# Patient Record
Sex: Female | Born: 1935 | Race: Black or African American | Hispanic: No | Marital: Married
Health system: Southern US, Community
[De-identification: ages and names within clinical notes are randomized; demographics above are authoritative.]

## PROBLEM LIST (undated history)

## (undated) DIAGNOSIS — I509 Heart failure, unspecified: Secondary | ICD-10-CM

## (undated) DIAGNOSIS — R569 Unspecified convulsions: Secondary | ICD-10-CM

## (undated) DIAGNOSIS — R55 Syncope and collapse: Secondary | ICD-10-CM

## (undated) DIAGNOSIS — E119 Type 2 diabetes mellitus without complications: Secondary | ICD-10-CM

## (undated) DIAGNOSIS — I10 Essential (primary) hypertension: Secondary | ICD-10-CM

## (undated) DIAGNOSIS — E079 Disorder of thyroid, unspecified: Secondary | ICD-10-CM

## (undated) HISTORY — DX: Essential (primary) hypertension: I10

## (undated) HISTORY — DX: Disorder of thyroid, unspecified: E07.9

## (undated) HISTORY — DX: Type 2 diabetes mellitus without complications: E11.9

## (undated) HISTORY — PX: OTHER SURGICAL HISTORY: SHX169

## (undated) HISTORY — DX: Unspecified convulsions: R56.9

## (undated) HISTORY — PX: FRACTURE SURGERY: SHX138

## (undated) HISTORY — DX: Syncope and collapse: R55

## (undated) HISTORY — DX: Heart failure, unspecified: I50.9

---

## 1998-04-26 ENCOUNTER — Encounter: Payer: Self-pay | Admitting: Thoracic Surgery

## 1998-04-27 ENCOUNTER — Ambulatory Visit (HOSPITAL_COMMUNITY): Admission: RE | Admit: 1998-04-27 | Discharge: 1998-04-27 | Payer: Self-pay | Admitting: Thoracic Surgery

## 1998-05-02 ENCOUNTER — Encounter: Payer: Self-pay | Admitting: Thoracic Surgery

## 1998-05-03 ENCOUNTER — Encounter: Payer: Self-pay | Admitting: Thoracic Surgery

## 1998-05-03 ENCOUNTER — Inpatient Hospital Stay (HOSPITAL_COMMUNITY): Admission: RE | Admit: 1998-05-03 | Discharge: 1998-05-09 | Payer: Self-pay | Admitting: Thoracic Surgery

## 1998-05-04 ENCOUNTER — Encounter: Payer: Self-pay | Admitting: Thoracic Surgery

## 1998-05-05 ENCOUNTER — Encounter: Payer: Self-pay | Admitting: Thoracic Surgery

## 1998-05-06 ENCOUNTER — Encounter: Payer: Self-pay | Admitting: Thoracic Surgery

## 1998-05-07 ENCOUNTER — Encounter: Payer: Self-pay | Admitting: Thoracic Surgery

## 2000-04-10 ENCOUNTER — Encounter: Admission: RE | Admit: 2000-04-10 | Discharge: 2000-04-10 | Payer: Self-pay | Admitting: Thoracic Surgery

## 2000-04-10 ENCOUNTER — Encounter: Payer: Self-pay | Admitting: Thoracic Surgery

## 2000-06-19 ENCOUNTER — Encounter: Payer: Self-pay | Admitting: Family Medicine

## 2000-06-19 ENCOUNTER — Ambulatory Visit (HOSPITAL_COMMUNITY): Admission: RE | Admit: 2000-06-19 | Discharge: 2000-06-19 | Payer: Self-pay | Admitting: Family Medicine

## 2000-07-08 ENCOUNTER — Ambulatory Visit (HOSPITAL_COMMUNITY): Admission: RE | Admit: 2000-07-08 | Discharge: 2000-07-08 | Payer: Self-pay | Admitting: Cardiology

## 2000-08-07 ENCOUNTER — Encounter: Admission: RE | Admit: 2000-08-07 | Discharge: 2000-08-07 | Payer: Self-pay | Admitting: Thoracic Surgery

## 2000-08-07 ENCOUNTER — Encounter: Payer: Self-pay | Admitting: Thoracic Surgery

## 2000-09-14 ENCOUNTER — Encounter: Payer: Self-pay | Admitting: Emergency Medicine

## 2000-09-14 ENCOUNTER — Emergency Department (HOSPITAL_COMMUNITY): Admission: EM | Admit: 2000-09-14 | Discharge: 2000-09-14 | Payer: Self-pay | Admitting: Emergency Medicine

## 2000-10-22 ENCOUNTER — Ambulatory Visit (HOSPITAL_COMMUNITY): Admission: RE | Admit: 2000-10-22 | Discharge: 2000-10-22 | Payer: Self-pay | Admitting: Ophthalmology

## 2000-11-18 ENCOUNTER — Encounter: Payer: Self-pay | Admitting: Family Medicine

## 2000-11-18 ENCOUNTER — Ambulatory Visit (HOSPITAL_COMMUNITY): Admission: RE | Admit: 2000-11-18 | Discharge: 2000-11-18 | Payer: Self-pay | Admitting: Family Medicine

## 2000-12-10 ENCOUNTER — Encounter: Admission: RE | Admit: 2000-12-10 | Discharge: 2000-12-10 | Payer: Self-pay | Admitting: Thoracic Surgery

## 2000-12-10 ENCOUNTER — Encounter: Payer: Self-pay | Admitting: Thoracic Surgery

## 2001-06-10 ENCOUNTER — Encounter: Payer: Self-pay | Admitting: Thoracic Surgery

## 2001-06-10 ENCOUNTER — Encounter: Admission: RE | Admit: 2001-06-10 | Discharge: 2001-06-10 | Payer: Self-pay | Admitting: Thoracic Surgery

## 2001-06-25 ENCOUNTER — Encounter: Payer: Self-pay | Admitting: Thoracic Surgery

## 2001-06-25 ENCOUNTER — Encounter: Admission: RE | Admit: 2001-06-25 | Discharge: 2001-06-25 | Payer: Self-pay | Admitting: Thoracic Surgery

## 2001-11-19 ENCOUNTER — Ambulatory Visit (HOSPITAL_COMMUNITY): Admission: RE | Admit: 2001-11-19 | Discharge: 2001-11-19 | Payer: Self-pay | Admitting: Family Medicine

## 2001-11-19 ENCOUNTER — Encounter: Payer: Self-pay | Admitting: Family Medicine

## 2001-12-26 ENCOUNTER — Encounter: Payer: Self-pay | Admitting: Family Medicine

## 2001-12-26 ENCOUNTER — Encounter: Admission: RE | Admit: 2001-12-26 | Discharge: 2001-12-26 | Payer: Self-pay | Admitting: Family Medicine

## 2001-12-26 ENCOUNTER — Encounter: Payer: Self-pay | Admitting: Thoracic Surgery

## 2002-02-09 ENCOUNTER — Ambulatory Visit (HOSPITAL_COMMUNITY): Admission: RE | Admit: 2002-02-09 | Discharge: 2002-02-09 | Payer: Self-pay | Admitting: Cardiology

## 2002-05-28 ENCOUNTER — Encounter: Payer: Self-pay | Admitting: Family Medicine

## 2002-05-28 ENCOUNTER — Ambulatory Visit (HOSPITAL_COMMUNITY): Admission: RE | Admit: 2002-05-28 | Discharge: 2002-05-28 | Payer: Self-pay | Admitting: Family Medicine

## 2002-06-01 ENCOUNTER — Ambulatory Visit (HOSPITAL_COMMUNITY): Admission: RE | Admit: 2002-06-01 | Discharge: 2002-06-01 | Payer: Self-pay | Admitting: Neurology

## 2002-06-02 ENCOUNTER — Inpatient Hospital Stay (HOSPITAL_COMMUNITY): Admission: RE | Admit: 2002-06-02 | Discharge: 2002-06-07 | Payer: Self-pay | Admitting: Family Medicine

## 2002-06-02 ENCOUNTER — Encounter: Payer: Self-pay | Admitting: Family Medicine

## 2002-06-05 ENCOUNTER — Ambulatory Visit (HOSPITAL_COMMUNITY): Admission: RE | Admit: 2002-06-05 | Discharge: 2002-06-05 | Payer: Self-pay | Admitting: Family Medicine

## 2002-06-05 ENCOUNTER — Encounter: Payer: Self-pay | Admitting: Family Medicine

## 2002-06-30 ENCOUNTER — Encounter: Payer: Self-pay | Admitting: Thoracic Surgery

## 2002-06-30 ENCOUNTER — Encounter: Admission: RE | Admit: 2002-06-30 | Discharge: 2002-06-30 | Payer: Self-pay | Admitting: Thoracic Surgery

## 2002-07-15 ENCOUNTER — Inpatient Hospital Stay (HOSPITAL_COMMUNITY): Admission: EM | Admit: 2002-07-15 | Discharge: 2002-07-24 | Payer: Self-pay | Admitting: *Deleted

## 2002-07-15 ENCOUNTER — Encounter: Payer: Self-pay | Admitting: Internal Medicine

## 2002-07-15 ENCOUNTER — Encounter: Payer: Self-pay | Admitting: Family Medicine

## 2002-07-17 ENCOUNTER — Encounter: Payer: Self-pay | Admitting: Family Medicine

## 2002-07-17 ENCOUNTER — Encounter: Payer: Self-pay | Admitting: General Surgery

## 2002-07-20 ENCOUNTER — Encounter: Payer: Self-pay | Admitting: Family Medicine

## 2002-07-23 ENCOUNTER — Encounter (INDEPENDENT_AMBULATORY_CARE_PROVIDER_SITE_OTHER): Payer: Self-pay | Admitting: Internal Medicine

## 2002-09-08 ENCOUNTER — Ambulatory Visit (HOSPITAL_COMMUNITY): Admission: RE | Admit: 2002-09-08 | Discharge: 2002-09-08 | Payer: Self-pay | Admitting: Cardiology

## 2002-09-22 ENCOUNTER — Ambulatory Visit (HOSPITAL_COMMUNITY): Admission: RE | Admit: 2002-09-22 | Discharge: 2002-09-22 | Payer: Self-pay | Admitting: Family Medicine

## 2002-09-22 ENCOUNTER — Encounter: Payer: Self-pay | Admitting: Family Medicine

## 2002-09-23 ENCOUNTER — Encounter: Payer: Self-pay | Admitting: Family Medicine

## 2002-09-23 ENCOUNTER — Ambulatory Visit (HOSPITAL_COMMUNITY): Admission: RE | Admit: 2002-09-23 | Discharge: 2002-09-23 | Payer: Self-pay | Admitting: Family Medicine

## 2002-09-30 ENCOUNTER — Ambulatory Visit (HOSPITAL_COMMUNITY): Admission: RE | Admit: 2002-09-30 | Discharge: 2002-09-30 | Payer: Self-pay | Admitting: Family Medicine

## 2002-09-30 ENCOUNTER — Encounter: Payer: Self-pay | Admitting: Family Medicine

## 2002-10-06 ENCOUNTER — Encounter: Payer: Self-pay | Admitting: Interventional Radiology

## 2002-10-06 ENCOUNTER — Inpatient Hospital Stay (HOSPITAL_COMMUNITY): Admission: RE | Admit: 2002-10-06 | Discharge: 2002-10-07 | Payer: Self-pay | Admitting: Interventional Radiology

## 2002-10-06 ENCOUNTER — Encounter (INDEPENDENT_AMBULATORY_CARE_PROVIDER_SITE_OTHER): Payer: Self-pay | Admitting: Specialist

## 2002-10-25 ENCOUNTER — Inpatient Hospital Stay (HOSPITAL_COMMUNITY): Admission: EM | Admit: 2002-10-25 | Discharge: 2002-11-02 | Payer: Self-pay | Admitting: Emergency Medicine

## 2002-10-25 ENCOUNTER — Encounter: Payer: Self-pay | Admitting: Emergency Medicine

## 2002-10-27 ENCOUNTER — Encounter: Payer: Self-pay | Admitting: Orthopaedic Surgery

## 2002-11-02 ENCOUNTER — Inpatient Hospital Stay (HOSPITAL_COMMUNITY)
Admission: RE | Admit: 2002-11-02 | Discharge: 2002-11-19 | Payer: Self-pay | Admitting: Physical Medicine & Rehabilitation

## 2002-11-13 ENCOUNTER — Encounter: Payer: Self-pay | Admitting: Physical Medicine & Rehabilitation

## 2002-11-14 ENCOUNTER — Encounter: Payer: Self-pay | Admitting: Physical Medicine & Rehabilitation

## 2002-11-17 ENCOUNTER — Encounter: Payer: Self-pay | Admitting: Physical Medicine & Rehabilitation

## 2002-11-27 ENCOUNTER — Encounter: Payer: Self-pay | Admitting: Orthopaedic Surgery

## 2002-11-27 ENCOUNTER — Ambulatory Visit (HOSPITAL_COMMUNITY): Admission: RE | Admit: 2002-11-27 | Discharge: 2002-11-27 | Payer: Self-pay | Admitting: Orthopaedic Surgery

## 2002-12-25 ENCOUNTER — Ambulatory Visit (HOSPITAL_COMMUNITY): Admission: RE | Admit: 2002-12-25 | Discharge: 2002-12-25 | Payer: Self-pay | Admitting: Orthopaedic Surgery

## 2002-12-25 ENCOUNTER — Encounter: Payer: Self-pay | Admitting: Orthopaedic Surgery

## 2003-05-26 ENCOUNTER — Encounter: Admission: RE | Admit: 2003-05-26 | Discharge: 2003-05-26 | Payer: Self-pay | Admitting: Thoracic Surgery

## 2003-08-05 ENCOUNTER — Ambulatory Visit (HOSPITAL_COMMUNITY): Admission: RE | Admit: 2003-08-05 | Discharge: 2003-08-05 | Payer: Self-pay | Admitting: Cardiology

## 2003-12-02 ENCOUNTER — Encounter: Admission: RE | Admit: 2003-12-02 | Discharge: 2003-12-02 | Payer: Self-pay | Admitting: Thoracic Surgery

## 2004-03-13 ENCOUNTER — Ambulatory Visit: Payer: Self-pay | Admitting: Cardiology

## 2004-07-30 ENCOUNTER — Inpatient Hospital Stay (HOSPITAL_COMMUNITY): Admission: EM | Admit: 2004-07-30 | Discharge: 2004-08-22 | Payer: Self-pay | Admitting: Emergency Medicine

## 2004-08-01 ENCOUNTER — Ambulatory Visit: Payer: Self-pay | Admitting: *Deleted

## 2004-08-02 ENCOUNTER — Encounter: Payer: Self-pay | Admitting: Cardiology

## 2004-08-02 ENCOUNTER — Ambulatory Visit: Payer: Self-pay | Admitting: Cardiology

## 2004-08-03 ENCOUNTER — Ambulatory Visit: Payer: Self-pay | Admitting: Cardiology

## 2004-08-03 ENCOUNTER — Encounter: Payer: Self-pay | Admitting: Cardiology

## 2004-08-07 ENCOUNTER — Ambulatory Visit: Payer: Self-pay | Admitting: Pulmonary Disease

## 2004-08-10 ENCOUNTER — Ambulatory Visit: Payer: Self-pay | Admitting: Gastroenterology

## 2004-08-11 ENCOUNTER — Encounter: Payer: Self-pay | Admitting: Cardiology

## 2004-08-21 ENCOUNTER — Ambulatory Visit: Payer: Self-pay | Admitting: Cardiology

## 2004-08-21 ENCOUNTER — Encounter: Payer: Self-pay | Admitting: Cardiology

## 2004-09-12 ENCOUNTER — Ambulatory Visit: Payer: Self-pay | Admitting: *Deleted

## 2004-09-13 ENCOUNTER — Ambulatory Visit (HOSPITAL_COMMUNITY): Admission: RE | Admit: 2004-09-13 | Discharge: 2004-09-13 | Payer: Self-pay | Admitting: Cardiology

## 2004-09-18 ENCOUNTER — Ambulatory Visit (HOSPITAL_COMMUNITY): Admission: RE | Admit: 2004-09-18 | Discharge: 2004-09-18 | Payer: Self-pay | Admitting: Cardiology

## 2004-09-27 ENCOUNTER — Encounter: Admission: RE | Admit: 2004-09-27 | Discharge: 2004-09-27 | Payer: Self-pay | Admitting: Thoracic Surgery

## 2004-12-06 ENCOUNTER — Encounter: Admission: RE | Admit: 2004-12-06 | Discharge: 2004-12-06 | Payer: Self-pay | Admitting: Thoracic Surgery

## 2004-12-11 ENCOUNTER — Ambulatory Visit: Payer: Self-pay | Admitting: Cardiology

## 2005-03-23 ENCOUNTER — Ambulatory Visit: Payer: Self-pay | Admitting: *Deleted

## 2005-03-23 ENCOUNTER — Ambulatory Visit: Payer: Self-pay | Admitting: Orthopedic Surgery

## 2005-03-23 ENCOUNTER — Inpatient Hospital Stay (HOSPITAL_COMMUNITY): Admission: EM | Admit: 2005-03-23 | Discharge: 2005-04-09 | Payer: Self-pay | Admitting: Emergency Medicine

## 2005-03-26 ENCOUNTER — Ambulatory Visit: Payer: Self-pay | Admitting: *Deleted

## 2005-04-09 ENCOUNTER — Inpatient Hospital Stay: Admission: AD | Admit: 2005-04-09 | Discharge: 2005-04-13 | Payer: Self-pay | Admitting: Internal Medicine

## 2005-04-11 ENCOUNTER — Emergency Department (HOSPITAL_COMMUNITY): Admission: EM | Admit: 2005-04-11 | Discharge: 2005-04-11 | Payer: Self-pay | Admitting: Emergency Medicine

## 2005-04-13 ENCOUNTER — Inpatient Hospital Stay (HOSPITAL_COMMUNITY): Admission: EM | Admit: 2005-04-13 | Discharge: 2005-04-17 | Payer: Self-pay | Admitting: Emergency Medicine

## 2005-04-17 ENCOUNTER — Inpatient Hospital Stay
Admission: AD | Admit: 2005-04-17 | Discharge: 2010-07-28 | Disposition: A | Discharge status: Nursing Home (Includes ICF) | Payer: Medicare Other | Attending: Internal Medicine | Admitting: Internal Medicine

## 2005-05-02 ENCOUNTER — Ambulatory Visit: Payer: Self-pay | Admitting: Orthopedic Surgery

## 2005-06-13 ENCOUNTER — Ambulatory Visit: Payer: Self-pay | Admitting: Orthopedic Surgery

## 2005-07-08 ENCOUNTER — Ambulatory Visit (HOSPITAL_COMMUNITY): Admission: RE | Admit: 2005-07-08 | Discharge: 2005-07-08 | Payer: Self-pay | Admitting: Internal Medicine

## 2005-07-09 ENCOUNTER — Ambulatory Visit (HOSPITAL_COMMUNITY): Admission: RE | Admit: 2005-07-09 | Discharge: 2005-07-09 | Payer: Self-pay | Admitting: Internal Medicine

## 2005-07-11 ENCOUNTER — Ambulatory Visit: Payer: Self-pay | Admitting: Orthopedic Surgery

## 2005-07-17 ENCOUNTER — Ambulatory Visit (HOSPITAL_COMMUNITY): Admission: RE | Admit: 2005-07-17 | Discharge: 2005-07-17 | Payer: Self-pay | Admitting: Orthopedic Surgery

## 2005-07-22 ENCOUNTER — Ambulatory Visit (HOSPITAL_COMMUNITY): Admission: RE | Admit: 2005-07-22 | Discharge: 2005-07-22 | Payer: Self-pay | Admitting: Dermatology

## 2005-12-26 ENCOUNTER — Ambulatory Visit: Payer: Self-pay | Admitting: Internal Medicine

## 2006-01-30 ENCOUNTER — Ambulatory Visit: Payer: Self-pay | Admitting: Internal Medicine

## 2006-03-13 ENCOUNTER — Ambulatory Visit: Payer: Self-pay | Admitting: Internal Medicine

## 2006-05-08 ENCOUNTER — Ambulatory Visit: Payer: Self-pay | Admitting: Internal Medicine

## 2007-01-14 ENCOUNTER — Ambulatory Visit (HOSPITAL_COMMUNITY): Admission: RE | Admit: 2007-01-14 | Discharge: 2007-01-14 | Payer: Self-pay | Admitting: Internal Medicine

## 2007-03-06 ENCOUNTER — Encounter: Payer: Self-pay | Admitting: Family Medicine

## 2008-01-07 ENCOUNTER — Ambulatory Visit (HOSPITAL_COMMUNITY): Admission: RE | Admit: 2008-01-07 | Discharge: 2008-01-07 | Payer: Self-pay | Admitting: Internal Medicine

## 2008-05-28 ENCOUNTER — Ambulatory Visit (HOSPITAL_COMMUNITY): Admission: RE | Admit: 2008-05-28 | Discharge: 2008-05-28 | Payer: Self-pay | Admitting: Internal Medicine

## 2009-02-08 ENCOUNTER — Emergency Department (HOSPITAL_COMMUNITY): Admission: EM | Admit: 2009-02-08 | Discharge: 2009-02-08 | Payer: Self-pay | Admitting: Emergency Medicine

## 2009-03-15 ENCOUNTER — Ambulatory Visit (HOSPITAL_COMMUNITY): Admission: RE | Admit: 2009-03-15 | Discharge: 2009-03-15 | Payer: Self-pay | Admitting: Internal Medicine

## 2009-03-29 ENCOUNTER — Ambulatory Visit (HOSPITAL_COMMUNITY): Admission: RE | Admit: 2009-03-29 | Discharge: 2009-03-29 | Payer: Self-pay | Admitting: Orthopaedic Surgery

## 2009-04-01 ENCOUNTER — Ambulatory Visit (HOSPITAL_COMMUNITY): Admission: RE | Admit: 2009-04-01 | Discharge: 2009-04-01 | Payer: Self-pay | Admitting: Internal Medicine

## 2009-04-12 ENCOUNTER — Ambulatory Visit (HOSPITAL_COMMUNITY): Admission: RE | Admit: 2009-04-12 | Discharge: 2009-04-12 | Payer: Self-pay | Admitting: Orthopaedic Surgery

## 2009-05-11 ENCOUNTER — Ambulatory Visit (HOSPITAL_COMMUNITY): Admission: RE | Admit: 2009-05-11 | Discharge: 2009-05-11 | Payer: Self-pay | Admitting: Orthopaedic Surgery

## 2010-03-08 LAB — GLUCOSE, CAPILLARY
Glucose-Capillary: 128 mg/dL — ABNORMAL HIGH (ref 70–99)
Glucose-Capillary: 158 mg/dL — ABNORMAL HIGH (ref 70–99)

## 2010-03-09 LAB — GLUCOSE, CAPILLARY
Glucose-Capillary: 106 mg/dL — ABNORMAL HIGH (ref 70–99)
Glucose-Capillary: 160 mg/dL — ABNORMAL HIGH (ref 70–99)

## 2010-03-10 LAB — GLUCOSE, CAPILLARY: Glucose-Capillary: 133 mg/dL — ABNORMAL HIGH (ref 70–99)

## 2010-03-20 LAB — GLUCOSE, CAPILLARY
Glucose-Capillary: 138 mg/dL — ABNORMAL HIGH (ref 70–99)
Glucose-Capillary: 128 mg/dL — ABNORMAL HIGH (ref 70–99)
Glucose-Capillary: 190 mg/dL — ABNORMAL HIGH (ref 70–99)
Glucose-Capillary: 127 mg/dL — ABNORMAL HIGH (ref 70–99)
Glucose-Capillary: 208 mg/dL — ABNORMAL HIGH (ref 70–99)
Glucose-Capillary: 120 mg/dL — ABNORMAL HIGH (ref 70–99)
Glucose-Capillary: 124 mg/dL — ABNORMAL HIGH (ref 70–99)
Glucose-Capillary: 186 mg/dL — ABNORMAL HIGH (ref 70–99)
Glucose-Capillary: 99 mg/dL (ref 70–99)
Glucose-Capillary: 193 mg/dL — ABNORMAL HIGH (ref 70–99)
Glucose-Capillary: 124 mg/dL — ABNORMAL HIGH (ref 70–99)
Glucose-Capillary: 227 mg/dL — ABNORMAL HIGH (ref 70–99)
Glucose-Capillary: 122 mg/dL — ABNORMAL HIGH (ref 70–99)
Glucose-Capillary: 211 mg/dL — ABNORMAL HIGH (ref 70–99)
Glucose-Capillary: 100 mg/dL — ABNORMAL HIGH (ref 70–99)
Glucose-Capillary: 183 mg/dL — ABNORMAL HIGH (ref 70–99)
Glucose-Capillary: 132 mg/dL — ABNORMAL HIGH (ref 70–99)
Glucose-Capillary: 213 mg/dL — ABNORMAL HIGH (ref 70–99)

## 2010-03-22 LAB — GLUCOSE, CAPILLARY
Glucose-Capillary: 116 mg/dL — ABNORMAL HIGH (ref 70–99)
Glucose-Capillary: 147 mg/dL — ABNORMAL HIGH (ref 70–99)
Glucose-Capillary: 162 mg/dL — ABNORMAL HIGH (ref 70–99)
Glucose-Capillary: 276 mg/dL — ABNORMAL HIGH (ref 70–99)

## 2010-03-26 ENCOUNTER — Encounter (HOSPITAL_BASED_OUTPATIENT_CLINIC_OR_DEPARTMENT_OTHER): Payer: Self-pay | Admitting: Internal Medicine

## 2010-03-26 ENCOUNTER — Encounter: Payer: Self-pay | Admitting: Thoracic Surgery

## 2010-03-27 LAB — GLUCOSE, CAPILLARY
Glucose-Capillary: 126 mg/dL — ABNORMAL HIGH (ref 70–99)
Glucose-Capillary: 160 mg/dL — ABNORMAL HIGH (ref 70–99)
Glucose-Capillary: 142 mg/dL — ABNORMAL HIGH (ref 70–99)
Glucose-Capillary: 146 mg/dL — ABNORMAL HIGH (ref 70–99)
Glucose-Capillary: 245 mg/dL — ABNORMAL HIGH (ref 70–99)

## 2010-03-28 LAB — GLUCOSE, CAPILLARY
Glucose-Capillary: 239 mg/dL — ABNORMAL HIGH (ref 70–99)
Glucose-Capillary: 151 mg/dL — ABNORMAL HIGH (ref 70–99)

## 2010-03-29 LAB — GLUCOSE, CAPILLARY
Glucose-Capillary: 132 mg/dL — ABNORMAL HIGH (ref 70–99)
Glucose-Capillary: 277 mg/dL — ABNORMAL HIGH (ref 70–99)

## 2010-03-30 LAB — GLUCOSE, CAPILLARY: Glucose-Capillary: 139 mg/dL — ABNORMAL HIGH (ref 70–99)

## 2010-04-02 LAB — GLUCOSE, CAPILLARY: Glucose-Capillary: 249 mg/dL — ABNORMAL HIGH (ref 70–99)

## 2010-04-03 LAB — GLUCOSE, CAPILLARY: Glucose-Capillary: 201 mg/dL — ABNORMAL HIGH (ref 70–99)

## 2010-04-04 LAB — GLUCOSE, CAPILLARY: Glucose-Capillary: 143 mg/dL — ABNORMAL HIGH (ref 70–99)

## 2010-04-04 NOTE — Letter (Signed)
Summary: rma chart  rma chart   Imported By: Curtis Sites 10/20/2009 14:56:32  _____________________________________________________________________  External Attachment:    Type:   Image     Comment:   External Document

## 2010-04-06 LAB — GLUCOSE, CAPILLARY: Glucose-Capillary: 207 mg/dL — ABNORMAL HIGH (ref 70–99)

## 2010-04-07 LAB — GLUCOSE, CAPILLARY: Glucose-Capillary: 230 mg/dL — ABNORMAL HIGH (ref 70–99)

## 2010-04-09 LAB — GLUCOSE, CAPILLARY

## 2010-04-10 LAB — GLUCOSE, CAPILLARY
Glucose-Capillary: 125 mg/dL — ABNORMAL HIGH (ref 70–99)
Glucose-Capillary: 168 mg/dL — ABNORMAL HIGH (ref 70–99)

## 2010-04-11 LAB — GLUCOSE, CAPILLARY: Glucose-Capillary: 146 mg/dL — ABNORMAL HIGH (ref 70–99)

## 2010-04-12 LAB — GLUCOSE, CAPILLARY: Glucose-Capillary: 106 mg/dL — ABNORMAL HIGH (ref 70–99)

## 2010-04-13 LAB — GLUCOSE, CAPILLARY: Glucose-Capillary: 187 mg/dL — ABNORMAL HIGH (ref 70–99)

## 2010-04-14 LAB — GLUCOSE, CAPILLARY
Glucose-Capillary: 105 mg/dL — ABNORMAL HIGH (ref 70–99)
Glucose-Capillary: 233 mg/dL — ABNORMAL HIGH (ref 70–99)

## 2010-04-15 LAB — GLUCOSE, CAPILLARY

## 2010-04-16 LAB — GLUCOSE, CAPILLARY: Glucose-Capillary: 165 mg/dL — ABNORMAL HIGH (ref 70–99)

## 2010-04-17 LAB — GLUCOSE, CAPILLARY: Glucose-Capillary: 254 mg/dL — ABNORMAL HIGH (ref 70–99)

## 2010-04-18 LAB — GLUCOSE, CAPILLARY: Glucose-Capillary: 259 mg/dL — ABNORMAL HIGH (ref 70–99)

## 2010-04-20 LAB — GLUCOSE, CAPILLARY: Glucose-Capillary: 142 mg/dL — ABNORMAL HIGH (ref 70–99)

## 2010-04-21 LAB — GLUCOSE, CAPILLARY: Glucose-Capillary: 142 mg/dL — ABNORMAL HIGH (ref 70–99)

## 2010-04-22 LAB — GLUCOSE, CAPILLARY: Glucose-Capillary: 155 mg/dL — ABNORMAL HIGH (ref 70–99)

## 2010-04-23 LAB — GLUCOSE, CAPILLARY: Glucose-Capillary: 331 mg/dL — ABNORMAL HIGH (ref 70–99)

## 2010-04-24 LAB — GLUCOSE, CAPILLARY: Glucose-Capillary: 108 mg/dL — ABNORMAL HIGH (ref 70–99)

## 2010-04-26 LAB — GLUCOSE, CAPILLARY: Glucose-Capillary: 198 mg/dL — ABNORMAL HIGH (ref 70–99)

## 2010-04-27 LAB — GLUCOSE, CAPILLARY: Glucose-Capillary: 222 mg/dL — ABNORMAL HIGH (ref 70–99)

## 2010-04-28 LAB — GLUCOSE, CAPILLARY
Glucose-Capillary: 168 mg/dL — ABNORMAL HIGH (ref 70–99)
Glucose-Capillary: 168 mg/dL — ABNORMAL HIGH (ref 70–99)

## 2010-04-29 LAB — GLUCOSE, CAPILLARY: Glucose-Capillary: 148 mg/dL — ABNORMAL HIGH (ref 70–99)

## 2010-04-30 LAB — GLUCOSE, CAPILLARY: Glucose-Capillary: 96 mg/dL (ref 70–99)

## 2010-05-01 LAB — GLUCOSE, CAPILLARY
Glucose-Capillary: 94 mg/dL (ref 70–99)
Glucose-Capillary: 157 mg/dL — ABNORMAL HIGH (ref 70–99)

## 2010-05-03 LAB — GLUCOSE, CAPILLARY: Glucose-Capillary: 156 mg/dL — ABNORMAL HIGH (ref 70–99)

## 2010-05-05 LAB — GLUCOSE, CAPILLARY
Glucose-Capillary: 165 mg/dL — ABNORMAL HIGH (ref 70–99)
Glucose-Capillary: 326 mg/dL — ABNORMAL HIGH (ref 70–99)

## 2010-05-06 LAB — GLUCOSE, CAPILLARY: Glucose-Capillary: 193 mg/dL — ABNORMAL HIGH (ref 70–99)

## 2010-05-07 LAB — GLUCOSE, CAPILLARY: Glucose-Capillary: 108 mg/dL — ABNORMAL HIGH (ref 70–99)

## 2010-05-08 LAB — GLUCOSE, CAPILLARY
Glucose-Capillary: 137 mg/dL — ABNORMAL HIGH (ref 70–99)
Glucose-Capillary: 114 mg/dL — ABNORMAL HIGH (ref 70–99)

## 2010-05-09 LAB — GLUCOSE, CAPILLARY
Glucose-Capillary: 120 mg/dL — ABNORMAL HIGH (ref 70–99)
Glucose-Capillary: 208 mg/dL — ABNORMAL HIGH (ref 70–99)

## 2010-05-15 LAB — GLUCOSE, CAPILLARY
Glucose-Capillary: 173 mg/dL — ABNORMAL HIGH (ref 70–99)
Glucose-Capillary: 218 mg/dL — ABNORMAL HIGH (ref 70–99)
Glucose-Capillary: 224 mg/dL — ABNORMAL HIGH (ref 70–99)
Glucose-Capillary: 226 mg/dL — ABNORMAL HIGH (ref 70–99)
Glucose-Capillary: 108 mg/dL — ABNORMAL HIGH (ref 70–99)
Glucose-Capillary: 109 mg/dL — ABNORMAL HIGH (ref 70–99)
Glucose-Capillary: 115 mg/dL — ABNORMAL HIGH (ref 70–99)
Glucose-Capillary: 116 mg/dL — ABNORMAL HIGH (ref 70–99)
Glucose-Capillary: 117 mg/dL — ABNORMAL HIGH (ref 70–99)
Glucose-Capillary: 125 mg/dL — ABNORMAL HIGH (ref 70–99)
Glucose-Capillary: 131 mg/dL — ABNORMAL HIGH (ref 70–99)
Glucose-Capillary: 132 mg/dL — ABNORMAL HIGH (ref 70–99)
Glucose-Capillary: 134 mg/dL — ABNORMAL HIGH (ref 70–99)
Glucose-Capillary: 139 mg/dL — ABNORMAL HIGH (ref 70–99)
Glucose-Capillary: 142 mg/dL — ABNORMAL HIGH (ref 70–99)
Glucose-Capillary: 148 mg/dL — ABNORMAL HIGH (ref 70–99)
Glucose-Capillary: 151 mg/dL — ABNORMAL HIGH (ref 70–99)
Glucose-Capillary: 152 mg/dL — ABNORMAL HIGH (ref 70–99)
Glucose-Capillary: 153 mg/dL — ABNORMAL HIGH (ref 70–99)
Glucose-Capillary: 167 mg/dL — ABNORMAL HIGH (ref 70–99)
Glucose-Capillary: 183 mg/dL — ABNORMAL HIGH (ref 70–99)
Glucose-Capillary: 183 mg/dL — ABNORMAL HIGH (ref 70–99)
Glucose-Capillary: 186 mg/dL — ABNORMAL HIGH (ref 70–99)
Glucose-Capillary: 191 mg/dL — ABNORMAL HIGH (ref 70–99)
Glucose-Capillary: 206 mg/dL — ABNORMAL HIGH (ref 70–99)
Glucose-Capillary: 234 mg/dL — ABNORMAL HIGH (ref 70–99)
Glucose-Capillary: 240 mg/dL — ABNORMAL HIGH (ref 70–99)
Glucose-Capillary: 246 mg/dL — ABNORMAL HIGH (ref 70–99)
Glucose-Capillary: 277 mg/dL — ABNORMAL HIGH (ref 70–99)
Glucose-Capillary: 103 mg/dL — ABNORMAL HIGH (ref 70–99)
Glucose-Capillary: 105 mg/dL — ABNORMAL HIGH (ref 70–99)
Glucose-Capillary: 116 mg/dL — ABNORMAL HIGH (ref 70–99)
Glucose-Capillary: 122 mg/dL — ABNORMAL HIGH (ref 70–99)
Glucose-Capillary: 126 mg/dL — ABNORMAL HIGH (ref 70–99)
Glucose-Capillary: 130 mg/dL — ABNORMAL HIGH (ref 70–99)
Glucose-Capillary: 135 mg/dL — ABNORMAL HIGH (ref 70–99)
Glucose-Capillary: 178 mg/dL — ABNORMAL HIGH (ref 70–99)
Glucose-Capillary: 187 mg/dL — ABNORMAL HIGH (ref 70–99)
Glucose-Capillary: 203 mg/dL — ABNORMAL HIGH (ref 70–99)
Glucose-Capillary: 221 mg/dL — ABNORMAL HIGH (ref 70–99)
Glucose-Capillary: 244 mg/dL — ABNORMAL HIGH (ref 70–99)
Glucose-Capillary: 253 mg/dL — ABNORMAL HIGH (ref 70–99)
Glucose-Capillary: 99 mg/dL (ref 70–99)

## 2010-05-16 LAB — GLUCOSE, CAPILLARY
Glucose-Capillary: 150 mg/dL — ABNORMAL HIGH (ref 70–99)
Glucose-Capillary: 111 mg/dL — ABNORMAL HIGH (ref 70–99)
Glucose-Capillary: 113 mg/dL — ABNORMAL HIGH (ref 70–99)
Glucose-Capillary: 113 mg/dL — ABNORMAL HIGH (ref 70–99)
Glucose-Capillary: 121 mg/dL — ABNORMAL HIGH (ref 70–99)
Glucose-Capillary: 122 mg/dL — ABNORMAL HIGH (ref 70–99)
Glucose-Capillary: 123 mg/dL — ABNORMAL HIGH (ref 70–99)
Glucose-Capillary: 123 mg/dL — ABNORMAL HIGH (ref 70–99)
Glucose-Capillary: 124 mg/dL — ABNORMAL HIGH (ref 70–99)
Glucose-Capillary: 125 mg/dL — ABNORMAL HIGH (ref 70–99)
Glucose-Capillary: 125 mg/dL — ABNORMAL HIGH (ref 70–99)
Glucose-Capillary: 133 mg/dL — ABNORMAL HIGH (ref 70–99)
Glucose-Capillary: 137 mg/dL — ABNORMAL HIGH (ref 70–99)
Glucose-Capillary: 138 mg/dL — ABNORMAL HIGH (ref 70–99)
Glucose-Capillary: 138 mg/dL — ABNORMAL HIGH (ref 70–99)
Glucose-Capillary: 146 mg/dL — ABNORMAL HIGH (ref 70–99)
Glucose-Capillary: 147 mg/dL — ABNORMAL HIGH (ref 70–99)
Glucose-Capillary: 152 mg/dL — ABNORMAL HIGH (ref 70–99)
Glucose-Capillary: 153 mg/dL — ABNORMAL HIGH (ref 70–99)
Glucose-Capillary: 157 mg/dL — ABNORMAL HIGH (ref 70–99)
Glucose-Capillary: 158 mg/dL — ABNORMAL HIGH (ref 70–99)
Glucose-Capillary: 159 mg/dL — ABNORMAL HIGH (ref 70–99)
Glucose-Capillary: 163 mg/dL — ABNORMAL HIGH (ref 70–99)
Glucose-Capillary: 170 mg/dL — ABNORMAL HIGH (ref 70–99)
Glucose-Capillary: 171 mg/dL — ABNORMAL HIGH (ref 70–99)
Glucose-Capillary: 186 mg/dL — ABNORMAL HIGH (ref 70–99)
Glucose-Capillary: 194 mg/dL — ABNORMAL HIGH (ref 70–99)
Glucose-Capillary: 196 mg/dL — ABNORMAL HIGH (ref 70–99)
Glucose-Capillary: 202 mg/dL — ABNORMAL HIGH (ref 70–99)
Glucose-Capillary: 228 mg/dL — ABNORMAL HIGH (ref 70–99)
Glucose-Capillary: 229 mg/dL — ABNORMAL HIGH (ref 70–99)
Glucose-Capillary: 238 mg/dL — ABNORMAL HIGH (ref 70–99)
Glucose-Capillary: 242 mg/dL — ABNORMAL HIGH (ref 70–99)
Glucose-Capillary: 255 mg/dL — ABNORMAL HIGH (ref 70–99)
Glucose-Capillary: 262 mg/dL — ABNORMAL HIGH (ref 70–99)
Glucose-Capillary: 93 mg/dL (ref 70–99)
Glucose-Capillary: 93 mg/dL (ref 70–99)
Glucose-Capillary: 99 mg/dL (ref 70–99)

## 2010-05-17 LAB — GLUCOSE, CAPILLARY
Glucose-Capillary: 100 mg/dL — ABNORMAL HIGH (ref 70–99)
Glucose-Capillary: 103 mg/dL — ABNORMAL HIGH (ref 70–99)
Glucose-Capillary: 113 mg/dL — ABNORMAL HIGH (ref 70–99)
Glucose-Capillary: 117 mg/dL — ABNORMAL HIGH (ref 70–99)
Glucose-Capillary: 118 mg/dL — ABNORMAL HIGH (ref 70–99)
Glucose-Capillary: 120 mg/dL — ABNORMAL HIGH (ref 70–99)
Glucose-Capillary: 120 mg/dL — ABNORMAL HIGH (ref 70–99)
Glucose-Capillary: 131 mg/dL — ABNORMAL HIGH (ref 70–99)
Glucose-Capillary: 144 mg/dL — ABNORMAL HIGH (ref 70–99)
Glucose-Capillary: 144 mg/dL — ABNORMAL HIGH (ref 70–99)
Glucose-Capillary: 144 mg/dL — ABNORMAL HIGH (ref 70–99)
Glucose-Capillary: 145 mg/dL — ABNORMAL HIGH (ref 70–99)
Glucose-Capillary: 148 mg/dL — ABNORMAL HIGH (ref 70–99)
Glucose-Capillary: 159 mg/dL — ABNORMAL HIGH (ref 70–99)
Glucose-Capillary: 177 mg/dL — ABNORMAL HIGH (ref 70–99)
Glucose-Capillary: 221 mg/dL — ABNORMAL HIGH (ref 70–99)
Glucose-Capillary: 224 mg/dL — ABNORMAL HIGH (ref 70–99)
Glucose-Capillary: 248 mg/dL — ABNORMAL HIGH (ref 70–99)
Glucose-Capillary: 265 mg/dL — ABNORMAL HIGH (ref 70–99)
Glucose-Capillary: 81 mg/dL (ref 70–99)
Glucose-Capillary: 108 mg/dL — ABNORMAL HIGH (ref 70–99)
Glucose-Capillary: 118 mg/dL — ABNORMAL HIGH (ref 70–99)
Glucose-Capillary: 126 mg/dL — ABNORMAL HIGH (ref 70–99)
Glucose-Capillary: 128 mg/dL — ABNORMAL HIGH (ref 70–99)
Glucose-Capillary: 135 mg/dL — ABNORMAL HIGH (ref 70–99)
Glucose-Capillary: 143 mg/dL — ABNORMAL HIGH (ref 70–99)
Glucose-Capillary: 179 mg/dL — ABNORMAL HIGH (ref 70–99)
Glucose-Capillary: 179 mg/dL — ABNORMAL HIGH (ref 70–99)
Glucose-Capillary: 189 mg/dL — ABNORMAL HIGH (ref 70–99)
Glucose-Capillary: 225 mg/dL — ABNORMAL HIGH (ref 70–99)
Glucose-Capillary: 257 mg/dL — ABNORMAL HIGH (ref 70–99)
Glucose-Capillary: 297 mg/dL — ABNORMAL HIGH (ref 70–99)
Glucose-Capillary: 92 mg/dL (ref 70–99)

## 2010-05-18 LAB — GLUCOSE, CAPILLARY
Glucose-Capillary: 101 mg/dL — ABNORMAL HIGH (ref 70–99)
Glucose-Capillary: 113 mg/dL — ABNORMAL HIGH (ref 70–99)
Glucose-Capillary: 113 mg/dL — ABNORMAL HIGH (ref 70–99)
Glucose-Capillary: 116 mg/dL — ABNORMAL HIGH (ref 70–99)
Glucose-Capillary: 123 mg/dL — ABNORMAL HIGH (ref 70–99)
Glucose-Capillary: 129 mg/dL — ABNORMAL HIGH (ref 70–99)
Glucose-Capillary: 141 mg/dL — ABNORMAL HIGH (ref 70–99)
Glucose-Capillary: 167 mg/dL — ABNORMAL HIGH (ref 70–99)
Glucose-Capillary: 196 mg/dL — ABNORMAL HIGH (ref 70–99)
Glucose-Capillary: 201 mg/dL — ABNORMAL HIGH (ref 70–99)
Glucose-Capillary: 212 mg/dL — ABNORMAL HIGH (ref 70–99)
Glucose-Capillary: 214 mg/dL — ABNORMAL HIGH (ref 70–99)
Glucose-Capillary: 236 mg/dL — ABNORMAL HIGH (ref 70–99)
Glucose-Capillary: 71 mg/dL (ref 70–99)
Glucose-Capillary: 98 mg/dL (ref 70–99)
Glucose-Capillary: 110 mg/dL — ABNORMAL HIGH (ref 70–99)
Glucose-Capillary: 111 mg/dL — ABNORMAL HIGH (ref 70–99)
Glucose-Capillary: 120 mg/dL — ABNORMAL HIGH (ref 70–99)
Glucose-Capillary: 122 mg/dL — ABNORMAL HIGH (ref 70–99)
Glucose-Capillary: 122 mg/dL — ABNORMAL HIGH (ref 70–99)
Glucose-Capillary: 136 mg/dL — ABNORMAL HIGH (ref 70–99)
Glucose-Capillary: 138 mg/dL — ABNORMAL HIGH (ref 70–99)
Glucose-Capillary: 138 mg/dL — ABNORMAL HIGH (ref 70–99)
Glucose-Capillary: 142 mg/dL — ABNORMAL HIGH (ref 70–99)
Glucose-Capillary: 156 mg/dL — ABNORMAL HIGH (ref 70–99)
Glucose-Capillary: 165 mg/dL — ABNORMAL HIGH (ref 70–99)
Glucose-Capillary: 185 mg/dL — ABNORMAL HIGH (ref 70–99)
Glucose-Capillary: 192 mg/dL — ABNORMAL HIGH (ref 70–99)
Glucose-Capillary: 194 mg/dL — ABNORMAL HIGH (ref 70–99)
Glucose-Capillary: 220 mg/dL — ABNORMAL HIGH (ref 70–99)
Glucose-Capillary: 231 mg/dL — ABNORMAL HIGH (ref 70–99)
Glucose-Capillary: 86 mg/dL (ref 70–99)
Glucose-Capillary: 95 mg/dL (ref 70–99)
Glucose-Capillary: 229 mg/dL — ABNORMAL HIGH (ref 70–99)
Glucose-Capillary: 103 mg/dL — ABNORMAL HIGH (ref 70–99)
Glucose-Capillary: 125 mg/dL — ABNORMAL HIGH (ref 70–99)
Glucose-Capillary: 126 mg/dL — ABNORMAL HIGH (ref 70–99)
Glucose-Capillary: 130 mg/dL — ABNORMAL HIGH (ref 70–99)
Glucose-Capillary: 146 mg/dL — ABNORMAL HIGH (ref 70–99)
Glucose-Capillary: 148 mg/dL — ABNORMAL HIGH (ref 70–99)
Glucose-Capillary: 169 mg/dL — ABNORMAL HIGH (ref 70–99)
Glucose-Capillary: 181 mg/dL — ABNORMAL HIGH (ref 70–99)
Glucose-Capillary: 182 mg/dL — ABNORMAL HIGH (ref 70–99)
Glucose-Capillary: 187 mg/dL — ABNORMAL HIGH (ref 70–99)
Glucose-Capillary: 205 mg/dL — ABNORMAL HIGH (ref 70–99)
Glucose-Capillary: 223 mg/dL — ABNORMAL HIGH (ref 70–99)
Glucose-Capillary: 223 mg/dL — ABNORMAL HIGH (ref 70–99)
Glucose-Capillary: 249 mg/dL — ABNORMAL HIGH (ref 70–99)
Glucose-Capillary: 92 mg/dL (ref 70–99)

## 2010-05-19 LAB — GLUCOSE, CAPILLARY
Glucose-Capillary: 133 mg/dL — ABNORMAL HIGH (ref 70–99)
Glucose-Capillary: 105 mg/dL — ABNORMAL HIGH (ref 70–99)
Glucose-Capillary: 111 mg/dL — ABNORMAL HIGH (ref 70–99)
Glucose-Capillary: 113 mg/dL — ABNORMAL HIGH (ref 70–99)
Glucose-Capillary: 127 mg/dL — ABNORMAL HIGH (ref 70–99)
Glucose-Capillary: 133 mg/dL — ABNORMAL HIGH (ref 70–99)
Glucose-Capillary: 135 mg/dL — ABNORMAL HIGH (ref 70–99)
Glucose-Capillary: 158 mg/dL — ABNORMAL HIGH (ref 70–99)
Glucose-Capillary: 163 mg/dL — ABNORMAL HIGH (ref 70–99)
Glucose-Capillary: 193 mg/dL — ABNORMAL HIGH (ref 70–99)
Glucose-Capillary: 195 mg/dL — ABNORMAL HIGH (ref 70–99)
Glucose-Capillary: 212 mg/dL — ABNORMAL HIGH (ref 70–99)
Glucose-Capillary: 239 mg/dL — ABNORMAL HIGH (ref 70–99)
Glucose-Capillary: 91 mg/dL (ref 70–99)
Glucose-Capillary: 92 mg/dL (ref 70–99)
Glucose-Capillary: 94 mg/dL (ref 70–99)
Glucose-Capillary: 96 mg/dL (ref 70–99)
Glucose-Capillary: 121 mg/dL — ABNORMAL HIGH (ref 70–99)

## 2010-05-20 LAB — GLUCOSE, CAPILLARY
Glucose-Capillary: 113 mg/dL — ABNORMAL HIGH (ref 70–99)
Glucose-Capillary: 115 mg/dL — ABNORMAL HIGH (ref 70–99)
Glucose-Capillary: 116 mg/dL — ABNORMAL HIGH (ref 70–99)
Glucose-Capillary: 119 mg/dL — ABNORMAL HIGH (ref 70–99)
Glucose-Capillary: 120 mg/dL — ABNORMAL HIGH (ref 70–99)
Glucose-Capillary: 123 mg/dL — ABNORMAL HIGH (ref 70–99)
Glucose-Capillary: 176 mg/dL — ABNORMAL HIGH (ref 70–99)
Glucose-Capillary: 189 mg/dL — ABNORMAL HIGH (ref 70–99)
Glucose-Capillary: 224 mg/dL — ABNORMAL HIGH (ref 70–99)
Glucose-Capillary: 224 mg/dL — ABNORMAL HIGH (ref 70–99)
Glucose-Capillary: 246 mg/dL — ABNORMAL HIGH (ref 70–99)
Glucose-Capillary: 76 mg/dL (ref 70–99)
Glucose-Capillary: 92 mg/dL (ref 70–99)
Glucose-Capillary: 83 mg/dL (ref 70–99)
Glucose-Capillary: 101 mg/dL — ABNORMAL HIGH (ref 70–99)
Glucose-Capillary: 133 mg/dL — ABNORMAL HIGH (ref 70–99)
Glucose-Capillary: 141 mg/dL — ABNORMAL HIGH (ref 70–99)
Glucose-Capillary: 171 mg/dL — ABNORMAL HIGH (ref 70–99)
Glucose-Capillary: 197 mg/dL — ABNORMAL HIGH (ref 70–99)
Glucose-Capillary: 59 mg/dL — ABNORMAL LOW (ref 70–99)
Glucose-Capillary: 68 mg/dL — ABNORMAL LOW (ref 70–99)
Glucose-Capillary: 82 mg/dL (ref 70–99)
Glucose-Capillary: 89 mg/dL (ref 70–99)
Glucose-Capillary: 209 mg/dL — ABNORMAL HIGH (ref 70–99)

## 2010-05-21 LAB — GLUCOSE, CAPILLARY
Glucose-Capillary: 107 mg/dL — ABNORMAL HIGH (ref 70–99)
Glucose-Capillary: 110 mg/dL — ABNORMAL HIGH (ref 70–99)
Glucose-Capillary: 112 mg/dL — ABNORMAL HIGH (ref 70–99)
Glucose-Capillary: 113 mg/dL — ABNORMAL HIGH (ref 70–99)
Glucose-Capillary: 113 mg/dL — ABNORMAL HIGH (ref 70–99)
Glucose-Capillary: 114 mg/dL — ABNORMAL HIGH (ref 70–99)
Glucose-Capillary: 120 mg/dL — ABNORMAL HIGH (ref 70–99)
Glucose-Capillary: 121 mg/dL — ABNORMAL HIGH (ref 70–99)
Glucose-Capillary: 171 mg/dL — ABNORMAL HIGH (ref 70–99)
Glucose-Capillary: 180 mg/dL — ABNORMAL HIGH (ref 70–99)
Glucose-Capillary: 185 mg/dL — ABNORMAL HIGH (ref 70–99)
Glucose-Capillary: 191 mg/dL — ABNORMAL HIGH (ref 70–99)
Glucose-Capillary: 216 mg/dL — ABNORMAL HIGH (ref 70–99)
Glucose-Capillary: 90 mg/dL (ref 70–99)
Glucose-Capillary: 108 mg/dL — ABNORMAL HIGH (ref 70–99)
Glucose-Capillary: 110 mg/dL — ABNORMAL HIGH (ref 70–99)
Glucose-Capillary: 126 mg/dL — ABNORMAL HIGH (ref 70–99)
Glucose-Capillary: 132 mg/dL — ABNORMAL HIGH (ref 70–99)
Glucose-Capillary: 133 mg/dL — ABNORMAL HIGH (ref 70–99)
Glucose-Capillary: 73 mg/dL (ref 70–99)
Glucose-Capillary: 88 mg/dL (ref 70–99)
Glucose-Capillary: 102 mg/dL — ABNORMAL HIGH (ref 70–99)
Glucose-Capillary: 106 mg/dL — ABNORMAL HIGH (ref 70–99)
Glucose-Capillary: 109 mg/dL — ABNORMAL HIGH (ref 70–99)
Glucose-Capillary: 110 mg/dL — ABNORMAL HIGH (ref 70–99)
Glucose-Capillary: 156 mg/dL — ABNORMAL HIGH (ref 70–99)
Glucose-Capillary: 265 mg/dL — ABNORMAL HIGH (ref 70–99)
Glucose-Capillary: 61 mg/dL — ABNORMAL LOW (ref 70–99)
Glucose-Capillary: 69 mg/dL — ABNORMAL LOW (ref 70–99)
Glucose-Capillary: 73 mg/dL (ref 70–99)
Glucose-Capillary: 95 mg/dL (ref 70–99)
Glucose-Capillary: 181 mg/dL — ABNORMAL HIGH (ref 70–99)

## 2010-05-22 LAB — GLUCOSE, CAPILLARY
Glucose-Capillary: 74 mg/dL (ref 70–99)
Glucose-Capillary: 100 mg/dL — ABNORMAL HIGH (ref 70–99)
Glucose-Capillary: 110 mg/dL — ABNORMAL HIGH (ref 70–99)
Glucose-Capillary: 52 mg/dL — ABNORMAL LOW (ref 70–99)
Glucose-Capillary: 56 mg/dL — ABNORMAL LOW (ref 70–99)
Glucose-Capillary: 64 mg/dL — ABNORMAL LOW (ref 70–99)
Glucose-Capillary: 82 mg/dL (ref 70–99)
Glucose-Capillary: 83 mg/dL (ref 70–99)
Glucose-Capillary: 93 mg/dL (ref 70–99)
Glucose-Capillary: 103 mg/dL — ABNORMAL HIGH (ref 70–99)
Glucose-Capillary: 113 mg/dL — ABNORMAL HIGH (ref 70–99)
Glucose-Capillary: 122 mg/dL — ABNORMAL HIGH (ref 70–99)
Glucose-Capillary: 158 mg/dL — ABNORMAL HIGH (ref 70–99)
Glucose-Capillary: 51 mg/dL — ABNORMAL LOW (ref 70–99)
Glucose-Capillary: 55 mg/dL — ABNORMAL LOW (ref 70–99)
Glucose-Capillary: 69 mg/dL — ABNORMAL LOW (ref 70–99)
Glucose-Capillary: 74 mg/dL (ref 70–99)
Glucose-Capillary: 89 mg/dL (ref 70–99)
Glucose-Capillary: 93 mg/dL (ref 70–99)
Glucose-Capillary: 97 mg/dL (ref 70–99)
Glucose-Capillary: 176 mg/dL — ABNORMAL HIGH (ref 70–99)

## 2010-05-23 LAB — GLUCOSE, CAPILLARY
Glucose-Capillary: 108 mg/dL — ABNORMAL HIGH (ref 70–99)
Glucose-Capillary: 175 mg/dL — ABNORMAL HIGH (ref 70–99)
Glucose-Capillary: 51 mg/dL — ABNORMAL LOW (ref 70–99)
Glucose-Capillary: 65 mg/dL — ABNORMAL LOW (ref 70–99)
Glucose-Capillary: 66 mg/dL — ABNORMAL LOW (ref 70–99)
Glucose-Capillary: 71 mg/dL (ref 70–99)
Glucose-Capillary: 76 mg/dL (ref 70–99)
Glucose-Capillary: 100 mg/dL — ABNORMAL HIGH (ref 70–99)
Glucose-Capillary: 109 mg/dL — ABNORMAL HIGH (ref 70–99)
Glucose-Capillary: 50 mg/dL — ABNORMAL LOW (ref 70–99)
Glucose-Capillary: 68 mg/dL — ABNORMAL LOW (ref 70–99)
Glucose-Capillary: 79 mg/dL (ref 70–99)
Glucose-Capillary: 82 mg/dL (ref 70–99)
Glucose-Capillary: 93 mg/dL (ref 70–99)
Glucose-Capillary: 98 mg/dL (ref 70–99)
Glucose-Capillary: 221 mg/dL — ABNORMAL HIGH (ref 70–99)

## 2010-05-24 LAB — GLUCOSE, CAPILLARY
Glucose-Capillary: 110 mg/dL — ABNORMAL HIGH (ref 70–99)
Glucose-Capillary: 101 mg/dL — ABNORMAL HIGH (ref 70–99)
Glucose-Capillary: 109 mg/dL — ABNORMAL HIGH (ref 70–99)
Glucose-Capillary: 117 mg/dL — ABNORMAL HIGH (ref 70–99)
Glucose-Capillary: 119 mg/dL — ABNORMAL HIGH (ref 70–99)
Glucose-Capillary: 123 mg/dL — ABNORMAL HIGH (ref 70–99)
Glucose-Capillary: 51 mg/dL — ABNORMAL LOW (ref 70–99)
Glucose-Capillary: 54 mg/dL — ABNORMAL LOW (ref 70–99)
Glucose-Capillary: 54 mg/dL — ABNORMAL LOW (ref 70–99)
Glucose-Capillary: 56 mg/dL — ABNORMAL LOW (ref 70–99)
Glucose-Capillary: 62 mg/dL — ABNORMAL LOW (ref 70–99)
Glucose-Capillary: 62 mg/dL — ABNORMAL LOW (ref 70–99)
Glucose-Capillary: 66 mg/dL — ABNORMAL LOW (ref 70–99)
Glucose-Capillary: 68 mg/dL — ABNORMAL LOW (ref 70–99)
Glucose-Capillary: 71 mg/dL (ref 70–99)
Glucose-Capillary: 71 mg/dL (ref 70–99)
Glucose-Capillary: 84 mg/dL (ref 70–99)
Glucose-Capillary: 87 mg/dL (ref 70–99)
Glucose-Capillary: 96 mg/dL (ref 70–99)
Glucose-Capillary: 121 mg/dL — ABNORMAL HIGH (ref 70–99)
Glucose-Capillary: 131 mg/dL — ABNORMAL HIGH (ref 70–99)
Glucose-Capillary: 53 mg/dL — ABNORMAL LOW (ref 70–99)
Glucose-Capillary: 82 mg/dL (ref 70–99)
Glucose-Capillary: 96 mg/dL (ref 70–99)

## 2010-05-26 LAB — GLUCOSE, CAPILLARY
Glucose-Capillary: 146 mg/dL — ABNORMAL HIGH (ref 70–99)
Glucose-Capillary: 102 mg/dL — ABNORMAL HIGH (ref 70–99)
Glucose-Capillary: 108 mg/dL — ABNORMAL HIGH (ref 70–99)
Glucose-Capillary: 109 mg/dL — ABNORMAL HIGH (ref 70–99)
Glucose-Capillary: 115 mg/dL — ABNORMAL HIGH (ref 70–99)
Glucose-Capillary: 117 mg/dL — ABNORMAL HIGH (ref 70–99)
Glucose-Capillary: 125 mg/dL — ABNORMAL HIGH (ref 70–99)
Glucose-Capillary: 136 mg/dL — ABNORMAL HIGH (ref 70–99)
Glucose-Capillary: 136 mg/dL — ABNORMAL HIGH (ref 70–99)
Glucose-Capillary: 143 mg/dL — ABNORMAL HIGH (ref 70–99)
Glucose-Capillary: 146 mg/dL — ABNORMAL HIGH (ref 70–99)
Glucose-Capillary: 150 mg/dL — ABNORMAL HIGH (ref 70–99)
Glucose-Capillary: 163 mg/dL — ABNORMAL HIGH (ref 70–99)
Glucose-Capillary: 166 mg/dL — ABNORMAL HIGH (ref 70–99)
Glucose-Capillary: 166 mg/dL — ABNORMAL HIGH (ref 70–99)
Glucose-Capillary: 168 mg/dL — ABNORMAL HIGH (ref 70–99)
Glucose-Capillary: 178 mg/dL — ABNORMAL HIGH (ref 70–99)
Glucose-Capillary: 185 mg/dL — ABNORMAL HIGH (ref 70–99)
Glucose-Capillary: 186 mg/dL — ABNORMAL HIGH (ref 70–99)
Glucose-Capillary: 207 mg/dL — ABNORMAL HIGH (ref 70–99)
Glucose-Capillary: 212 mg/dL — ABNORMAL HIGH (ref 70–99)
Glucose-Capillary: 213 mg/dL — ABNORMAL HIGH (ref 70–99)
Glucose-Capillary: 214 mg/dL — ABNORMAL HIGH (ref 70–99)
Glucose-Capillary: 228 mg/dL — ABNORMAL HIGH (ref 70–99)
Glucose-Capillary: 236 mg/dL — ABNORMAL HIGH (ref 70–99)
Glucose-Capillary: 258 mg/dL — ABNORMAL HIGH (ref 70–99)
Glucose-Capillary: 380 mg/dL — ABNORMAL HIGH (ref 70–99)
Glucose-Capillary: 48 mg/dL — ABNORMAL LOW (ref 70–99)
Glucose-Capillary: 54 mg/dL — ABNORMAL LOW (ref 70–99)
Glucose-Capillary: 58 mg/dL — ABNORMAL LOW (ref 70–99)
Glucose-Capillary: 60 mg/dL — ABNORMAL LOW (ref 70–99)
Glucose-Capillary: 61 mg/dL — ABNORMAL LOW (ref 70–99)
Glucose-Capillary: 66 mg/dL — ABNORMAL LOW (ref 70–99)
Glucose-Capillary: 68 mg/dL — ABNORMAL LOW (ref 70–99)
Glucose-Capillary: 74 mg/dL (ref 70–99)
Glucose-Capillary: 81 mg/dL (ref 70–99)
Glucose-Capillary: 85 mg/dL (ref 70–99)
Glucose-Capillary: 87 mg/dL (ref 70–99)
Glucose-Capillary: 93 mg/dL (ref 70–99)
Glucose-Capillary: 96 mg/dL (ref 70–99)
Glucose-Capillary: 98 mg/dL (ref 70–99)
Glucose-Capillary: 242 mg/dL — ABNORMAL HIGH (ref 70–99)

## 2010-05-27 LAB — GLUCOSE, CAPILLARY: Glucose-Capillary: 116 mg/dL — ABNORMAL HIGH (ref 70–99)

## 2010-05-28 LAB — GLUCOSE, CAPILLARY: Glucose-Capillary: 129 mg/dL — ABNORMAL HIGH (ref 70–99)

## 2010-05-29 LAB — GLUCOSE, CAPILLARY
Glucose-Capillary: 103 mg/dL — ABNORMAL HIGH (ref 70–99)
Glucose-Capillary: 201 mg/dL — ABNORMAL HIGH (ref 70–99)

## 2010-05-30 LAB — GLUCOSE, CAPILLARY
Glucose-Capillary: 137 mg/dL — ABNORMAL HIGH (ref 70–99)
Glucose-Capillary: 130 mg/dL — ABNORMAL HIGH (ref 70–99)

## 2010-05-31 LAB — GLUCOSE, CAPILLARY: Glucose-Capillary: 129 mg/dL — ABNORMAL HIGH (ref 70–99)

## 2010-06-02 LAB — GLUCOSE, CAPILLARY
Glucose-Capillary: 134 mg/dL — ABNORMAL HIGH (ref 70–99)
Glucose-Capillary: 177 mg/dL — ABNORMAL HIGH (ref 70–99)

## 2010-06-03 LAB — GLUCOSE, CAPILLARY
Glucose-Capillary: 109 mg/dL — ABNORMAL HIGH (ref 70–99)
Glucose-Capillary: 162 mg/dL — ABNORMAL HIGH (ref 70–99)

## 2010-06-04 LAB — GLUCOSE, CAPILLARY: Glucose-Capillary: 149 mg/dL — ABNORMAL HIGH (ref 70–99)

## 2010-06-05 LAB — GLUCOSE, CAPILLARY
Glucose-Capillary: 100 mg/dL — ABNORMAL HIGH (ref 70–99)
Glucose-Capillary: 106 mg/dL — ABNORMAL HIGH (ref 70–99)
Glucose-Capillary: 125 mg/dL — ABNORMAL HIGH (ref 70–99)
Glucose-Capillary: 131 mg/dL — ABNORMAL HIGH (ref 70–99)
Glucose-Capillary: 57 mg/dL — ABNORMAL LOW (ref 70–99)
Glucose-Capillary: 80 mg/dL (ref 70–99)
Glucose-Capillary: 81 mg/dL (ref 70–99)
Glucose-Capillary: 84 mg/dL (ref 70–99)
Glucose-Capillary: 95 mg/dL (ref 70–99)
Glucose-Capillary: 97 mg/dL (ref 70–99)

## 2010-06-06 ENCOUNTER — Ambulatory Visit (HOSPITAL_COMMUNITY): Payer: Medicare Other | Attending: Internal Medicine

## 2010-06-06 DIAGNOSIS — Z09 Encounter for follow-up examination after completed treatment for conditions other than malignant neoplasm: Secondary | ICD-10-CM | POA: Insufficient documentation

## 2010-06-06 DIAGNOSIS — I714 Abdominal aortic aneurysm, without rupture, unspecified: Secondary | ICD-10-CM | POA: Insufficient documentation

## 2010-06-06 LAB — DIFFERENTIAL
Lymphocytes Relative: 16 % (ref 12–46)
Lymphs Abs: 1.2 10*3/uL (ref 0.7–4.0)
Monocytes Absolute: 0.7 10*3/uL (ref 0.1–1.0)
Monocytes Relative: 9 % (ref 3–12)
Neutro Abs: 5.4 10*3/uL (ref 1.7–7.7)
Neutrophils Relative %: 72 % (ref 43–77)

## 2010-06-06 LAB — GLUCOSE, CAPILLARY
Glucose-Capillary: 135 mg/dL — ABNORMAL HIGH (ref 70–99)
Glucose-Capillary: 125 mg/dL — ABNORMAL HIGH (ref 70–99)
Glucose-Capillary: 44 mg/dL — ABNORMAL LOW (ref 70–99)
Glucose-Capillary: 56 mg/dL — ABNORMAL LOW (ref 70–99)
Glucose-Capillary: 63 mg/dL — ABNORMAL LOW (ref 70–99)
Glucose-Capillary: 65 mg/dL — ABNORMAL LOW (ref 70–99)
Glucose-Capillary: 71 mg/dL (ref 70–99)
Glucose-Capillary: 83 mg/dL (ref 70–99)
Glucose-Capillary: 85 mg/dL (ref 70–99)
Glucose-Capillary: 95 mg/dL (ref 70–99)

## 2010-06-06 LAB — BASIC METABOLIC PANEL
Calcium: 8.8 mg/dL (ref 8.4–10.5)
GFR calc non Af Amer: >60 mL/min (ref >60)
Potassium: 4 mEq/L (ref 3.5–5.1)
Sodium: 137 mEq/L (ref 135–145)

## 2010-06-06 LAB — URINALYSIS, ROUTINE W REFLEX MICROSCOPIC
Glucose, UA: 100 mg/dL — AB
Hgb urine dipstick: NEGATIVE
Specific Gravity, Urine: 1.015 (ref 1.005–1.030)
Urobilinogen, UA: 0.2 mg/dL (ref 0.0–1.0)
pH: 5.5 (ref 5.0–8.0)

## 2010-06-06 LAB — CBC
Hemoglobin: 13.8 g/dL (ref 12.0–15.0)
RBC: 4.51 MIL/uL (ref 3.87–5.11)
WBC: 7.6 10*3/uL (ref 4.0–10.5)

## 2010-06-07 LAB — GLUCOSE, CAPILLARY
Glucose-Capillary: 106 mg/dL — ABNORMAL HIGH (ref 70–99)
Glucose-Capillary: 122 mg/dL — ABNORMAL HIGH (ref 70–99)
Glucose-Capillary: 56 mg/dL — ABNORMAL LOW (ref 70–99)
Glucose-Capillary: 59 mg/dL — ABNORMAL LOW (ref 70–99)
Glucose-Capillary: 64 mg/dL — ABNORMAL LOW (ref 70–99)
Glucose-Capillary: 87 mg/dL (ref 70–99)
Glucose-Capillary: 89 mg/dL (ref 70–99)
Glucose-Capillary: 116 mg/dL — ABNORMAL HIGH (ref 70–99)
Glucose-Capillary: 229 mg/dL — ABNORMAL HIGH (ref 70–99)
Glucose-Capillary: 51 mg/dL — ABNORMAL LOW (ref 70–99)
Glucose-Capillary: 63 mg/dL — ABNORMAL LOW (ref 70–99)
Glucose-Capillary: 72 mg/dL (ref 70–99)
Glucose-Capillary: 73 mg/dL (ref 70–99)
Glucose-Capillary: 74 mg/dL (ref 70–99)
Glucose-Capillary: 77 mg/dL (ref 70–99)
Glucose-Capillary: 94 mg/dL (ref 70–99)
Glucose-Capillary: 218 mg/dL — ABNORMAL HIGH (ref 70–99)

## 2010-06-08 LAB — GLUCOSE, CAPILLARY
Glucose-Capillary: 110 mg/dL — ABNORMAL HIGH (ref 70–99)
Glucose-Capillary: 64 mg/dL — ABNORMAL LOW (ref 70–99)
Glucose-Capillary: 74 mg/dL (ref 70–99)
Glucose-Capillary: 82 mg/dL (ref 70–99)
Glucose-Capillary: 83 mg/dL (ref 70–99)
Glucose-Capillary: 94 mg/dL (ref 70–99)
Glucose-Capillary: 100 mg/dL — ABNORMAL HIGH (ref 70–99)
Glucose-Capillary: 107 mg/dL — ABNORMAL HIGH (ref 70–99)
Glucose-Capillary: 109 mg/dL — ABNORMAL HIGH (ref 70–99)
Glucose-Capillary: 154 mg/dL — ABNORMAL HIGH (ref 70–99)
Glucose-Capillary: 86 mg/dL (ref 70–99)
Glucose-Capillary: 90 mg/dL (ref 70–99)
Glucose-Capillary: 99 mg/dL (ref 70–99)

## 2010-06-09 LAB — GLUCOSE, CAPILLARY
Glucose-Capillary: 103 mg/dL — ABNORMAL HIGH (ref 70–99)
Glucose-Capillary: 104 mg/dL — ABNORMAL HIGH (ref 70–99)
Glucose-Capillary: 116 mg/dL — ABNORMAL HIGH (ref 70–99)
Glucose-Capillary: 120 mg/dL — ABNORMAL HIGH (ref 70–99)
Glucose-Capillary: 121 mg/dL — ABNORMAL HIGH (ref 70–99)
Glucose-Capillary: 57 mg/dL — ABNORMAL LOW (ref 70–99)
Glucose-Capillary: 79 mg/dL (ref 70–99)
Glucose-Capillary: 88 mg/dL (ref 70–99)
Glucose-Capillary: 148 mg/dL — ABNORMAL HIGH (ref 70–99)
Glucose-Capillary: 103 mg/dL — ABNORMAL HIGH (ref 70–99)
Glucose-Capillary: 110 mg/dL — ABNORMAL HIGH (ref 70–99)
Glucose-Capillary: 112 mg/dL — ABNORMAL HIGH (ref 70–99)
Glucose-Capillary: 131 mg/dL — ABNORMAL HIGH (ref 70–99)
Glucose-Capillary: 71 mg/dL (ref 70–99)
Glucose-Capillary: 238 mg/dL — ABNORMAL HIGH (ref 70–99)

## 2010-06-10 LAB — GLUCOSE, CAPILLARY
Glucose-Capillary: 110 mg/dL — ABNORMAL HIGH (ref 70–99)
Glucose-Capillary: 74 mg/dL (ref 70–99)
Glucose-Capillary: 88 mg/dL (ref 70–99)
Glucose-Capillary: 97 mg/dL (ref 70–99)
Glucose-Capillary: 98 mg/dL (ref 70–99)
Glucose-Capillary: 129 mg/dL — ABNORMAL HIGH (ref 70–99)
Glucose-Capillary: 105 mg/dL — ABNORMAL HIGH (ref 70–99)
Glucose-Capillary: 110 mg/dL — ABNORMAL HIGH (ref 70–99)
Glucose-Capillary: 110 mg/dL — ABNORMAL HIGH (ref 70–99)
Glucose-Capillary: 203 mg/dL — ABNORMAL HIGH (ref 70–99)
Glucose-Capillary: 84 mg/dL (ref 70–99)
Glucose-Capillary: 86 mg/dL (ref 70–99)
Glucose-Capillary: 89 mg/dL (ref 70–99)
Glucose-Capillary: 89 mg/dL (ref 70–99)
Glucose-Capillary: 96 mg/dL (ref 70–99)

## 2010-06-11 LAB — GLUCOSE, CAPILLARY
Glucose-Capillary: 91 mg/dL (ref 70–99)
Glucose-Capillary: 110 mg/dL — ABNORMAL HIGH (ref 70–99)
Glucose-Capillary: 120 mg/dL — ABNORMAL HIGH (ref 70–99)
Glucose-Capillary: 87 mg/dL (ref 70–99)
Glucose-Capillary: 92 mg/dL (ref 70–99)
Glucose-Capillary: 95 mg/dL (ref 70–99)
Glucose-Capillary: 95 mg/dL (ref 70–99)
Glucose-Capillary: 97 mg/dL (ref 70–99)
Glucose-Capillary: 99 mg/dL (ref 70–99)
Glucose-Capillary: 108 mg/dL — ABNORMAL HIGH (ref 70–99)
Glucose-Capillary: 114 mg/dL — ABNORMAL HIGH (ref 70–99)
Glucose-Capillary: 68 mg/dL — ABNORMAL LOW (ref 70–99)
Glucose-Capillary: 78 mg/dL (ref 70–99)
Glucose-Capillary: 78 mg/dL (ref 70–99)
Glucose-Capillary: 82 mg/dL (ref 70–99)
Glucose-Capillary: 84 mg/dL (ref 70–99)
Glucose-Capillary: 89 mg/dL (ref 70–99)
Glucose-Capillary: 93 mg/dL (ref 70–99)
Glucose-Capillary: 96 mg/dL (ref 70–99)

## 2010-06-12 LAB — GLUCOSE, CAPILLARY
Glucose-Capillary: 117 mg/dL — ABNORMAL HIGH (ref 70–99)
Glucose-Capillary: 77 mg/dL (ref 70–99)
Glucose-Capillary: 83 mg/dL (ref 70–99)
Glucose-Capillary: 88 mg/dL (ref 70–99)
Glucose-Capillary: 88 mg/dL (ref 70–99)
Glucose-Capillary: 91 mg/dL (ref 70–99)
Glucose-Capillary: 91 mg/dL (ref 70–99)
Glucose-Capillary: 93 mg/dL (ref 70–99)
Glucose-Capillary: 142 mg/dL — ABNORMAL HIGH (ref 70–99)
Glucose-Capillary: 106 mg/dL — ABNORMAL HIGH (ref 70–99)
Glucose-Capillary: 69 mg/dL — ABNORMAL LOW (ref 70–99)
Glucose-Capillary: 84 mg/dL (ref 70–99)
Glucose-Capillary: 88 mg/dL (ref 70–99)
Glucose-Capillary: 91 mg/dL (ref 70–99)
Glucose-Capillary: 93 mg/dL (ref 70–99)

## 2010-06-13 LAB — GLUCOSE, CAPILLARY
Glucose-Capillary: 100 mg/dL — ABNORMAL HIGH (ref 70–99)
Glucose-Capillary: 101 mg/dL — ABNORMAL HIGH (ref 70–99)
Glucose-Capillary: 56 mg/dL — ABNORMAL LOW (ref 70–99)
Glucose-Capillary: 62 mg/dL — ABNORMAL LOW (ref 70–99)
Glucose-Capillary: 84 mg/dL (ref 70–99)
Glucose-Capillary: 90 mg/dL (ref 70–99)
Glucose-Capillary: 94 mg/dL (ref 70–99)
Glucose-Capillary: 95 mg/dL (ref 70–99)
Glucose-Capillary: 98 mg/dL (ref 70–99)
Glucose-Capillary: 104 mg/dL — ABNORMAL HIGH (ref 70–99)
Glucose-Capillary: 60 mg/dL — ABNORMAL LOW (ref 70–99)
Glucose-Capillary: 77 mg/dL (ref 70–99)
Glucose-Capillary: 79 mg/dL (ref 70–99)
Glucose-Capillary: 98 mg/dL (ref 70–99)

## 2010-06-14 LAB — GLUCOSE, CAPILLARY
Glucose-Capillary: 116 mg/dL — ABNORMAL HIGH (ref 70–99)
Glucose-Capillary: 117 mg/dL — ABNORMAL HIGH (ref 70–99)
Glucose-Capillary: 136 mg/dL — ABNORMAL HIGH (ref 70–99)
Glucose-Capillary: 61 mg/dL — ABNORMAL LOW (ref 70–99)
Glucose-Capillary: 64 mg/dL — ABNORMAL LOW (ref 70–99)
Glucose-Capillary: 80 mg/dL (ref 70–99)
Glucose-Capillary: 89 mg/dL (ref 70–99)
Glucose-Capillary: 90 mg/dL (ref 70–99)
Glucose-Capillary: 93 mg/dL (ref 70–99)
Glucose-Capillary: 117 mg/dL — ABNORMAL HIGH (ref 70–99)
Glucose-Capillary: 106 mg/dL — ABNORMAL HIGH (ref 70–99)
Glucose-Capillary: 108 mg/dL — ABNORMAL HIGH (ref 70–99)
Glucose-Capillary: 62 mg/dL — ABNORMAL LOW (ref 70–99)
Glucose-Capillary: 68 mg/dL — ABNORMAL LOW (ref 70–99)
Glucose-Capillary: 68 mg/dL — ABNORMAL LOW (ref 70–99)
Glucose-Capillary: 76 mg/dL (ref 70–99)
Glucose-Capillary: 80 mg/dL (ref 70–99)
Glucose-Capillary: 80 mg/dL (ref 70–99)
Glucose-Capillary: 80 mg/dL (ref 70–99)
Glucose-Capillary: 83 mg/dL (ref 70–99)
Glucose-Capillary: 89 mg/dL (ref 70–99)
Glucose-Capillary: 90 mg/dL (ref 70–99)
Glucose-Capillary: 91 mg/dL (ref 70–99)
Glucose-Capillary: 99 mg/dL (ref 70–99)
Glucose-Capillary: 191 mg/dL — ABNORMAL HIGH (ref 70–99)

## 2010-06-15 LAB — GLUCOSE, CAPILLARY
Glucose-Capillary: 100 mg/dL — ABNORMAL HIGH (ref 70–99)
Glucose-Capillary: 101 mg/dL — ABNORMAL HIGH (ref 70–99)
Glucose-Capillary: 105 mg/dL — ABNORMAL HIGH (ref 70–99)
Glucose-Capillary: 113 mg/dL — ABNORMAL HIGH (ref 70–99)
Glucose-Capillary: 117 mg/dL — ABNORMAL HIGH (ref 70–99)
Glucose-Capillary: 118 mg/dL — ABNORMAL HIGH (ref 70–99)
Glucose-Capillary: 122 mg/dL — ABNORMAL HIGH (ref 70–99)
Glucose-Capillary: 145 mg/dL — ABNORMAL HIGH (ref 70–99)
Glucose-Capillary: 90 mg/dL (ref 70–99)
Glucose-Capillary: 91 mg/dL (ref 70–99)
Glucose-Capillary: 106 mg/dL — ABNORMAL HIGH (ref 70–99)
Glucose-Capillary: 106 mg/dL — ABNORMAL HIGH (ref 70–99)
Glucose-Capillary: 109 mg/dL — ABNORMAL HIGH (ref 70–99)
Glucose-Capillary: 120 mg/dL — ABNORMAL HIGH (ref 70–99)
Glucose-Capillary: 126 mg/dL — ABNORMAL HIGH (ref 70–99)
Glucose-Capillary: 129 mg/dL — ABNORMAL HIGH (ref 70–99)
Glucose-Capillary: 152 mg/dL — ABNORMAL HIGH (ref 70–99)
Glucose-Capillary: 97 mg/dL (ref 70–99)
Glucose-Capillary: 189 mg/dL — ABNORMAL HIGH (ref 70–99)

## 2010-06-16 LAB — GLUCOSE, CAPILLARY
Glucose-Capillary: 141 mg/dL — ABNORMAL HIGH (ref 70–99)
Glucose-Capillary: 218 mg/dL — ABNORMAL HIGH (ref 70–99)

## 2010-06-17 LAB — GLUCOSE, CAPILLARY
Glucose-Capillary: 124 mg/dL — ABNORMAL HIGH (ref 70–99)
Glucose-Capillary: 234 mg/dL — ABNORMAL HIGH (ref 70–99)

## 2010-06-18 LAB — GLUCOSE, CAPILLARY: Glucose-Capillary: 233 mg/dL — ABNORMAL HIGH (ref 70–99)

## 2010-06-19 LAB — GLUCOSE, CAPILLARY
Glucose-Capillary: 101 mg/dL — ABNORMAL HIGH (ref 70–99)
Glucose-Capillary: 105 mg/dL — ABNORMAL HIGH (ref 70–99)
Glucose-Capillary: 117 mg/dL — ABNORMAL HIGH (ref 70–99)
Glucose-Capillary: 127 mg/dL — ABNORMAL HIGH (ref 70–99)
Glucose-Capillary: 94 mg/dL (ref 70–99)
Glucose-Capillary: 95 mg/dL (ref 70–99)
Glucose-Capillary: 95 mg/dL (ref 70–99)
Glucose-Capillary: 103 mg/dL — ABNORMAL HIGH (ref 70–99)
Glucose-Capillary: 105 mg/dL — ABNORMAL HIGH (ref 70–99)
Glucose-Capillary: 111 mg/dL — ABNORMAL HIGH (ref 70–99)
Glucose-Capillary: 116 mg/dL — ABNORMAL HIGH (ref 70–99)
Glucose-Capillary: 123 mg/dL — ABNORMAL HIGH (ref 70–99)
Glucose-Capillary: 159 mg/dL — ABNORMAL HIGH (ref 70–99)
Glucose-Capillary: 91 mg/dL (ref 70–99)
Glucose-Capillary: 93 mg/dL (ref 70–99)
Glucose-Capillary: 94 mg/dL (ref 70–99)

## 2010-06-20 LAB — GLUCOSE, CAPILLARY
Glucose-Capillary: 106 mg/dL — ABNORMAL HIGH (ref 70–99)
Glucose-Capillary: 108 mg/dL — ABNORMAL HIGH (ref 70–99)
Glucose-Capillary: 109 mg/dL — ABNORMAL HIGH (ref 70–99)
Glucose-Capillary: 112 mg/dL — ABNORMAL HIGH (ref 70–99)
Glucose-Capillary: 118 mg/dL — ABNORMAL HIGH (ref 70–99)
Glucose-Capillary: 81 mg/dL (ref 70–99)
Glucose-Capillary: 94 mg/dL (ref 70–99)
Glucose-Capillary: 96 mg/dL (ref 70–99)
Glucose-Capillary: 99 mg/dL (ref 70–99)
Glucose-Capillary: 106 mg/dL — ABNORMAL HIGH (ref 70–99)
Glucose-Capillary: 116 mg/dL — ABNORMAL HIGH (ref 70–99)
Glucose-Capillary: 118 mg/dL — ABNORMAL HIGH (ref 70–99)
Glucose-Capillary: 120 mg/dL — ABNORMAL HIGH (ref 70–99)
Glucose-Capillary: 122 mg/dL — ABNORMAL HIGH (ref 70–99)
Glucose-Capillary: 142 mg/dL — ABNORMAL HIGH (ref 70–99)
Glucose-Capillary: 78 mg/dL (ref 70–99)

## 2010-06-21 LAB — GLUCOSE, CAPILLARY: Glucose-Capillary: 125 mg/dL — ABNORMAL HIGH (ref 70–99)

## 2010-06-22 LAB — GLUCOSE, CAPILLARY: Glucose-Capillary: 274 mg/dL — ABNORMAL HIGH (ref 70–99)

## 2010-06-23 LAB — GLUCOSE, CAPILLARY: Glucose-Capillary: 100 mg/dL — ABNORMAL HIGH (ref 70–99)

## 2010-06-26 LAB — GLUCOSE, CAPILLARY
Glucose-Capillary: 92 mg/dL (ref 70–99)
Glucose-Capillary: 138 mg/dL — ABNORMAL HIGH (ref 70–99)

## 2010-06-27 LAB — GLUCOSE, CAPILLARY
Glucose-Capillary: 112 mg/dL — ABNORMAL HIGH (ref 70–99)
Glucose-Capillary: 229 mg/dL — ABNORMAL HIGH (ref 70–99)

## 2010-06-28 LAB — GLUCOSE, CAPILLARY: Glucose-Capillary: 126 mg/dL — ABNORMAL HIGH (ref 70–99)

## 2010-07-01 LAB — GLUCOSE, CAPILLARY
Glucose-Capillary: 132 mg/dL — ABNORMAL HIGH (ref 70–99)
Glucose-Capillary: 152 mg/dL — ABNORMAL HIGH (ref 70–99)

## 2010-07-02 LAB — GLUCOSE, CAPILLARY: Glucose-Capillary: 128 mg/dL — ABNORMAL HIGH (ref 70–99)

## 2010-07-03 LAB — GLUCOSE, CAPILLARY
Glucose-Capillary: 120 mg/dL — ABNORMAL HIGH (ref 70–99)
Glucose-Capillary: 188 mg/dL — ABNORMAL HIGH (ref 70–99)

## 2010-07-06 LAB — GLUCOSE, CAPILLARY
Glucose-Capillary: 116 mg/dL — ABNORMAL HIGH (ref 70–99)
Glucose-Capillary: 191 mg/dL — ABNORMAL HIGH (ref 70–99)

## 2010-07-07 LAB — GLUCOSE, CAPILLARY
Glucose-Capillary: 135 mg/dL — ABNORMAL HIGH (ref 70–99)
Glucose-Capillary: 211 mg/dL — ABNORMAL HIGH (ref 70–99)

## 2010-07-09 LAB — GLUCOSE, CAPILLARY: Glucose-Capillary: 139 mg/dL — ABNORMAL HIGH (ref 70–99)

## 2010-07-10 LAB — GLUCOSE, CAPILLARY
Glucose-Capillary: 194 mg/dL — ABNORMAL HIGH (ref 70–99)
Glucose-Capillary: 141 mg/dL — ABNORMAL HIGH (ref 70–99)

## 2010-07-11 LAB — GLUCOSE, CAPILLARY
Glucose-Capillary: 144 mg/dL — ABNORMAL HIGH (ref 70–99)
Glucose-Capillary: 128 mg/dL — ABNORMAL HIGH (ref 70–99)
Glucose-Capillary: 179 mg/dL — ABNORMAL HIGH (ref 70–99)

## 2010-07-12 LAB — GLUCOSE, CAPILLARY: Glucose-Capillary: 145 mg/dL — ABNORMAL HIGH (ref 70–99)

## 2010-07-13 LAB — GLUCOSE, CAPILLARY: Glucose-Capillary: 193 mg/dL — ABNORMAL HIGH (ref 70–99)

## 2010-07-14 LAB — GLUCOSE, CAPILLARY: Glucose-Capillary: 126 mg/dL — ABNORMAL HIGH (ref 70–99)

## 2010-07-15 LAB — GLUCOSE, CAPILLARY: Glucose-Capillary: 210 mg/dL — ABNORMAL HIGH (ref 70–99)

## 2010-07-17 LAB — GLUCOSE, CAPILLARY: Glucose-Capillary: 135 mg/dL — ABNORMAL HIGH (ref 70–99)

## 2010-07-18 LAB — GLUCOSE, CAPILLARY: Glucose-Capillary: 76 mg/dL (ref 70–99)

## 2010-07-19 LAB — GLUCOSE, CAPILLARY
Glucose-Capillary: 110 mg/dL — ABNORMAL HIGH (ref 70–99)
Glucose-Capillary: 225 mg/dL — ABNORMAL HIGH (ref 70–99)

## 2010-07-20 LAB — GLUCOSE, CAPILLARY
Glucose-Capillary: 110 mg/dL — ABNORMAL HIGH (ref 70–99)
Glucose-Capillary: 190 mg/dL — ABNORMAL HIGH (ref 70–99)

## 2010-07-21 NOTE — Discharge Summary (Signed)
NAMEJOLETTA, MANNER                          ACCOUNT NO.:  [id number]   MEDICAL RECORD NO.:  [id number]                   PATIENT TYPE:  INP   LOCATION:  A329                                 FACILITY:  [facility]   PHYSICIAN:  J. Darreld Mclean, M.D.              DATE OF BIRTH:  [birth year]   DATE OF ADMISSION:  10/25/2002  DATE OF DISCHARGE:  11/02/2002                                 DISCHARGE SUMMARY   DISCHARGE DIAGNOSES:  1. Intertrochanteric fracture of the right hip.  2. Right proximal humeral fracture.  3. Hypertension.  4. Diabetes mellitus.  5. Anemia.  6. Hypoxia on admission.  7. Severe osteoporosis.  8. Mental confusion on admission secondary to electrolyte disorder.   OPERATION:  1. Open treatment, internal fixation of right intertrochanteric fracture.  2. Shoulder immobilizer to right upper extremity.   DISCHARGE DISPOSITION:  The patient is discharged to the [facility] rehab  center.  She is to follow up in Dr. Sanjuan Dame office November 30, 2002, at  10:15 for x-rays of her right hip and shoulder.   DISCHARGE STATUS:  Improved.   PROGNOSIS:  Good.   DISCHARGE MEDICATIONS:  1. Protonix 40 mg one tablet daily.  2. Minoxidil 10 mg p.o. t.i.d.  3. Catapres 0.2 mg p.o. t.i.d.  4. Tenormin 100 mg p.o. daily.  5. Lasix 40 mg p.o. every day.  6. Arixtra 2.5 mg q.24h. x10 days.  7. Avandia 4 mg p.o. every day.  8. Potassium chloride (K-Dur) 20 mEq one tablet p.o. t.i.d.  9. Darvocet-N 100 one tablet q.6h. p.r.n. for pain.  10.      Tylenol two tablets q.4h. p.r.n. for pain or temperature greater     than 101.  11.      Milk of magnesia 30 mL p.r.n. for constipation.   BRIEF HISTORY:  Please refer to the typewritten history and physical in the  patient's chart.   HOSPITAL COURSE:  The patient is a 77 year old African-American female who  was admitted to this institution on October 25, 2002, after she fell and  sustained an intertrochanteric fracture with  displacement to the right hip  as well as a right proximal humeral fracture.  At the time of admission the  patient was having visual hallucinations.  She was confused.  Laboratory  studies revealed hemoglobin 9.8, hematocrit 29.2.  Blood gases revealed CO2  of 44 and her PO2 was 49.  Bicarbonate was 26.  O2 saturation was only 83%.  Potassium was 3.1 and glucose 160.  BUN was 10, creatinine 0.8.   Mila Homer. Sudie Bailey, M.D., was consulted to correct her electrolyte  imbalance.  She was taken to the operating room on October 27, 2002, where  she underwent open treatment and internal fixation of her right hip fracture  under a spinal anesthetic.  She lost approximately 200 mL of blood, remained  stable throughout.   Postoperatively  she remained alert and oriented.  She was supplemented with  nasal cannula O2.  Her diabetes was checked with Accu-Checks and monitored  and treated with Avandia.   Postoperatively she was transfused because of a drop in her hemoglobin to  8.6.  She had a slight bit of confusion at that point in time once the  hemoglobin had gone up to a more normal range and was very cooperative with  the staff.  The transfusion brought her hemoglobin up to 9.9.  At the time  of discharge hemoglobin was 9.8.  Electrolytes were all within normal  limits.   Because of some confusion initially secondary to the low hemoglobin, the  patient made slow progress in physical therapy.  However, by the time of  discharge she was able to walk 10 feet with assistance with a platform  walker, toe-touch weightbearing.   The patient has remained afebrile, alert and oriented the past several days.  Blood sugars run around 106, and again this was controlled very well with  Avandia.   The patient's rehab potential is good.   Physical therapy recommended continued toe-touch weightbearing, platform  walker.  In regard to her right humeral fracture, she is to have her arm  taken out of the  shoulder immobilizer at least three times a day to extend  the elbow.  She is to continue the shoulder immobilizer another 10 days to  two weeks.  Next week can start gentle range of motion of the right  shoulder.   Staples are to be removed from her right hip wound on November 08, 2002, and  half-inch Steri-Strips applied.  Hydrogen peroxide may be used to clean the  wounds daily, and a light dressing may be applied if so desired.   The patient is to follow up in Dr. Sanjuan Dame office on November 30, 2002,  at 10:15 for x-rays of the right proximal humerus and right hip.   She is being discharged afebrile, in satisfactory condition.     Candace Cruise, Doylene Bode, M.D.    BB/MEDQ  D:  11/02/2002  T:  11/02/2002  Job:  [id number]

## 2010-07-21 NOTE — Op Note (Signed)
NAMEALUEL, SCHWARZ                          ACCOUNT NO.:  [id number]   MEDICAL RECORD NO.:  [id number]                   PATIENT TYPE:  INP   LOCATION:  IC06                                 FACILITY:  [facility]   PHYSICIAN:  J. Darreld Mclean, M.D.              DATE OF BIRTH:  1925/11/08   DATE OF PROCEDURE:  DATE OF DISCHARGE:                                 OPERATIVE REPORT   PREOPERATIVE DIAGNOSES:  1. Intertrochanteric fracture of the right hip, displaced.  2. Marked osteoporosis.  3. Comminuted fracture right proximal humerus.   POSTOPERATIVE DIAGNOSES:  1. Intertrochanteric fracture of the right hip, displaced.  2. Marked osteoporosis.  3. Comminuted fracture right proximal humerus.   PROCEDURE:  Open treatment with internal fixation of the right  intertrochanteric fracture of the hip using hip compression screw system --  90 mm screw and a 4-hole short barrel 140-degree side plate.   SURGEON:  J. Darreld Mclean, M.D.   ANESTHESIA:  Spinal   ASSISTANT:  Candace Cruise, PAC   DRAINS:  1 large Hemovac drain.   INDICATIONS FOR PHYSICIAN ASSISTANT:  [facility] and  it has no house staff.  The use of a physician assistant is medically  indicated and necessary to help me with the reduction of the fracture and  help with placed of the device.  This facilitates the procedure and shortens  the operating room time.  The patient is a markedly increased -risk and this  is medically indicated and necessary.   INDICATIONS:  The patient is a 77 year old female who fell at church Sunday;  today is Tuesday.  She sustained a fracture of the right hip and right  shoulder.  The patient has marked osteoporosis.  he has congestive heart  failure and marked arrhythmia. Dr. Sudie Bailey has seen her and gotten her into  the best shape possible.  She is a markedly increased risk.  The patient  also has hypoxemia with a very low pO2.  We have talked over the risks and  imponderables with the patient and the family, including: Infection;  pulmonary embolism which could healed to death; need for blood transfusion  (because the patient does have a history of anemia she was given blood  transfusion preoperatively and will most likely need it postoperatively);  need for physical therapy and limited weightbearing for a period of time  until the fracture heals; anesthesia risks; and possible need for  supplemental oxygenation after discharge; the patient may need to go to a  nursing home.  The patient's family appeared to understand and agreed to the  procedure as outlined  They understand that she is at markedly increased  risk.   DESCRIPTION OF PROCEDURE:  The patient was given spinal anesthesia and  transferred to the fracture table.  AP and lateral views now seem to be  satisfactory as the patient has been positioned.  The patient was prepped  and draped in the usual manner.  We ascertained that we were doing Ms.  Mehler and doing her right hip.   Incision was made through skin and subcutaneous tissue to the tensor fascia  lata was identified and a muscle splitting incision was made.  The femoral  shaft was identified.  A guidepin placed and a drill hole was made.  I  selected a 140-degree angle which measured 90 mm.  We step-drilled to 90 mm.  The compression screw was then inserted. It looked good on the AP and  lateral views and a 4-hole sideplate short barrel was used.  These were  placed under compression and these screws measured 40 mm and 36 mm.  Pictures were taken and saved.  This looked very good in AP and lateral  views.   The Hemovac drain was placed and vastus lateralis was reapproximated using  running #0 Bralon sutures.  The tensor fascia lata was reapproximated with  interrupted figure-of-eight #0 Bralon sutures and subcutaneous tissue was  closed in layers using 2-0 plain and the skin was reapproximated with  staples.  Hemovac was sewn  in place with 2-0 silk.  The patient tolerated  the procedure well and went to recovery in good condition.  She will go back  to the intensive care unit later.                                               Teola Bradley, M.D.    JWK/MEDQ  D:  10/27/2002  T:  10/28/2002  Job:  [id number]   cc:   Mila Homer. Sudie Bailey, M.D.  [address] [place], [place] [address]  Fax: [phone]

## 2010-07-21 NOTE — H&P (Signed)
NAMESALAYAH, MEARES                          ACCOUNT NO.:  [id number]   MEDICAL RECORD NO.:  [id number]                   PATIENT TYPE:  INP   LOCATION:  A331                                 FACILITY:  [facility]   PHYSICIAN:  Mila Homer. Sudie Bailey, M.D.           DATE OF BIRTH:  1925/04/08   DATE OF ADMISSION:  06/02/2002  DATE OF DISCHARGE:                                HISTORY & PHYSICAL   HISTORY OF PRESENT ILLNESS:  This 77 year old woman presented to the office  having had dizziness, vomiting, abdominal pain.   She has a long history of systolic hypertension, CAD, hypercholesterolemia,  cigarette smoking, diabetes. She also has abdominal aortic aneurysm and  history of lung cancer treated with the VATS procedure by Dr. Edwyna Shell back in  2000.  She has peripheral neuropathy and peripheral vascular disease.  Cigarette smoking ran from 1960 to 1996.  She has also had generalized  anxiety disorder.   CURRENT MEDICATIONS AT THE TIME OF ADMISSION:  1. Zyprexa 5 mg 1/2 tablet b.i.d.  2. Ferrous sulfate 325 mg b.i.d.  3. Zocor 80 mg q.d.  4. Lasix 40 mg q.d.  5. Lotrel 5/10 q.d.  6. Avandia 4 mg q.d.  7. Kay Ciel 10 mg q.d.  8. Clonidine 0.1 mg b.i.d.  9. HCTZ 25 mg q.d.  10.      Minoxidil 10 mg t.i.d.  11.      Atenolol 100 mg q.d.  12.      MV q.d.  13.      EC-ASA 81 mg q.d.   At the time we saw her she was just really getting weaker and weaker.  Her  husband was with her and told me she had not been eating or drinking  anything really in a week.   A week prior to this she had been seen in the office with new findings of  confusion.  She was telling her husband about a grandson and granddaughter  who would wrap themselves up in plastic.  I was concerned, with her history  of lung cancer, she may have had metastatic disease to the brain and MRI of  the brain had been ordered.  She has had a meningioma which has not changed  in size but she had no sign of a stroke or  bleeding.   She is also followed by Dr. Dietrich Pates, cardiologist.  She has episodic near  syncope probably related to  orthostatic hypotension.  She also had slowly  progressive renal insufficiency with the last creatinine clearance check -  54.  She has had gradually increasing weight loss and based on the record to  Dr, Dietrich Pates had lost 30 pounds compared to 05/02 and 16 pounds compared to  11/02.   She is examined in the office and found her blood pressure was 92/50,  significantly lower than it has ever been.  The pulse is only 70, however.  The respiratory rate was 14.  She seemed a little confused, could talk  fairly normally without slurring of speech.  Sentence structure was intact.  Lungs were clear throughout.  The heart had a regular rhythm with a rate of  about 70.  The abdomen was soft without hepatosplenomegaly, organomegaly or  mass .  There was no tenderness to the abdomen on palpation.  There is no  edema in the ankles.  There is no obvious adenopathy of the axillary,  supraclavicular or anterior cervical regions.  Mucous membranes were mildly  dry.   ASSESSMENT:  1. Hypotension.  Question etiology.  2. Aortic aneurysm, last diameter of 3.7 cm.  3. Recent episode of confusion, now being worked up by Dr, Orlin Hilding.  4. Systolic hypertension.  5. Coronary artery disease.  6. Hypercholesterolemia.  7. Cigarette smoking (1960 to 1996).  8. Abdominal aortic aneurysm.  9. Diabetes.  10.      Lung cancer.  11.      Peripheral neuropathy.  12.      Peripheral vascular disease.   PLAN OF TREATMENT:  Includes IV fluids and rest.  I have ordered an MR  angiogram of the abdomen, given an essentially normal CT of the abdomen.  Will hold on her antihypertensives at present.  Recheck her BUN, creatinine  and CBC.                                               Mila Homer. Sudie Bailey, M.D.    SDK/MEDQ  D:  06/03/2002  T:  06/03/2002  Job:  [id number]

## 2010-07-21 NOTE — Procedures (Signed)
NAMEDEARDRA, HINKLEY                ACCOUNT NO.:  [id number]   MEDICAL RECORD NO.:  [id number]          PATIENT TYPE:  INP   LOCATION:  IC06                          FACILITY:  [facility]   PHYSICIAN:  Vida Roller, M.D.   DATE OF BIRTH:  1925-06-29   DATE OF PROCEDURE:  08/01/2004  DATE OF DISCHARGE:                                  ECHOCARDIOGRAM   PRIMARY CARE PHYSICIAN:  Tesfaye D. [name], MD.   TAPE NUMBER:  [id number].   TAPE COUNT:  [id number].   HISTORY:  This is a 77 year old woman who presents with pulmonary edema on a  respirator in the intensive care unit.   TECHNICAL QUALITY:  This study is adequate.   M-MODE TRACINGS:  1. The aorta 30 mm.  2. The left atrium 46 mm.  3. The septum 17 mm.  4. The posterior wall 14 mm.  5. Left ventricular diastolic dimension 49 mm.  6. Left ventricular systolic dimension 39 mm.     2-D AND DOPPLER IMAGING:  1. The left ventricle is normal size with marked decreased left      ventricular systolic function. Estimated ejection fraction 25-30%.      There is mid-to-distal anterior hypokinesis; there is mid-to-distal      septal hypokinesis; and there is mid-to-distal lateral hypokinesis      consistent with a large anterolateral, anteroseptal myocardial      infarction.  2. The right ventricle is top normal size with preserved right ventricular      systolic function.  3. Both atria are enlarged.  4. The aortic valve is sclerotic with at least moderate aortic      insufficiency. There is no aortic stenosis.  5. The mitral valve has severe annular calcification. There is no obvious      mitral stenosis. There is mild mitral regurgitation.  6. Tricuspid valve has mild regurgitation.  7. Pulmonary valve not well seen.  8. There is no pericardial effusion.  9. The inferior vena cava was not well seen for.        JH/MEDQ  D:  08/01/2004  T:  08/01/2004  Job:  [id number]   cc:   Tesfaye D. [name], MD  [address]   [place]  [place] [address]  Fax: [phone]

## 2010-07-21 NOTE — Procedures (Signed)
   [name], [name]                          ACCOUNT NO.:  [id number]   MEDICAL RECORD NO.:  [id number]                   PATIENT TYPE:  INP   LOCATION:  IC06                                 FACILITY:  [facility]   PHYSICIAN:  Edward L. Juanetta Gosling, M.D.             DATE OF BIRTH:  1926/01/22   DATE OF PROCEDURE:  10/25/2002  DATE OF DISCHARGE:                                EKG INTERPRETATION   EKG INTERPRETATION:  The rhythm is sinus rhythm with PACs.  There is left  atrial enlargement.  There is poor R wave progression across the precordium  which may be due to LVH or other cause.  There are ST-T wave abnormalities  which are at least suggestive of ischemia and clinical correlation is  suggested.  Abnormal electrocardiogram.                                               Oneal Deputy. Juanetta Gosling, M.D.    ELH/MEDQ  D:  10/26/2002  T:  10/26/2002  Job:  [id number]

## 2010-07-21 NOTE — Group Therapy Note (Signed)
   [name], [name]                          ACCOUNT NO.:  [id number]   MEDICAL RECORD NO.:  [id number]                   PATIENT TYPE:  INP   LOCATION:  A212                                 FACILITY:  [facility]   PHYSICIAN:  Mila Homer. Sudie Bailey, M.D.           DATE OF BIRTH:  1926-01-21   DATE OF PROCEDURE:  07/18/2002  DATE OF DISCHARGE:                                   PROGRESS NOTE   SUBJECTIVE:  She is really feeling fine, except when she moves.  She was  doing better yesterday, and then ultrasound she began to move and began to  develop more severe upper abdominal pain.   OBJECTIVE:  GENERAL:  She is supine in the bed.  Alert and oriented, in no  acute distress.  Well-developed and thin.  VITAL SIGNS:  Temperature 97.3, pulse 57, respirations 20, blood pressure  159/73.  LUNGS:  Appear clear throughout.  HEART:  Regular rhythm, rate of about 60.  ABDOMEN:  Soft without hepatosplenomegaly or mass.  Deep palpation in both  the right upper quadrant and the left upper quadrant does not reproduce  pain.  Palpation under the ribs does not reproduce pain.  EXTREMITIES:  There is no edema of the ankles.   LABORATORY DATA:  Her ultrasound is reviewed.  The largest lesion in the  liver turned out be to a hemangioma.  She has a few other small scattered  lesions that cannot be characterized.  There is no other abnormality of her  upper abdomen to explain her pain.  CT scan of the chest shows cardiac  enlargement, left ventricular hypertrophy, small pericardial effusion,  extensive coronary aortic calcification, small left pleural effusion with  overlying atelectasis.  There are some surgical changes of the right lung,  but no evidence of residual recurrent tumor.   ASSESSMENT:  1. Persistent upper abdominal pain, question etiology, negative workup so     far, including abdominal CT scan, chest CT scan, and ultrasound.  2. Essential hypertension, better controlled.  3  Abdominal  aortic aneurysm which is stable.  1. Lung carcinoma, status post video assisted thoracoscopy.  2. Cardiomegaly.   PLAN:  Arrange bone scan.  Continue hospitalization at present.  If workup  persists in being negative, we will discharge home with pain medications.                                               Mila Homer. Sudie Bailey, M.D.    SDK/MEDQ  D:  07/18/2002  T:  07/18/2002  Job:  [id number]

## 2010-07-21 NOTE — Discharge Summary (Signed)
NAME:  [name], [name]                          ACCOUNT NO.:  [id number]   MEDICAL RECORD NO.:  [id number]                   PATIENT TYPE:  OUT   LOCATION:  MRI                                  FACILITY:  [facility]   PHYSICIAN:  Mila Homer. Sudie Bailey, M.D.           DATE OF BIRTH:  06-Jan-1926   DATE OF ADMISSION:  06/05/2002  DATE OF DISCHARGE:  06/05/2002                                 DISCHARGE SUMMARY   HISTORY OF PRESENT ILLNESS:  This 77 year old woman was admitted to the  hospital with hypotension and dehydration.  She had hospital course from  06/02/02-06/07/02.  Vital signs remained stable after initially having  hypotension.  The pressure increased towards normal.   LABORATORY DATA:  Initial blood gases on room air showed pH of 7.46, PCO2  35, PO2 76.  CBC was essentially normal.  H&H was 10.1 and 36.0.  Met-7  showed sodium 133, potassium 3.3, glucose 132, BUN 56, creatinine 1.6.  Recheck CBC showed hemoglobin staying stable at 10.4.  Recheck of her met-7  showed BUN down to 28, creatinine 1 after a couple days of IV fluids.  Her  cardiac panel showed a CK MB of 4.1, troponin 0.07, recheck 4.3 and 0.03.  Prior to hospitalization, she had been having confusional episodes.  Four to  five days prior to hospitalization, MRI of the brain showed a 1.3x2x1.8 cm  enhancing extra-axial mass.  The lateral left tentorium, felt to be a  hemangioma, had not changed since her prior MI.  She also had mild atrophy  and chronic small vessel ischemic changes with remote lacunar infarcts of  the left basal ganglia ophthalmus.  Due to abdominal pain in the hospital,  she had a CT of the abdomen and pelvis.  This showed extensive  atherosclerotic vascular disease and infrarenal abdominal aortic aneurysm  which measured 3.6x3.6 cm, and compared to a prior ultrasound of 3.8x3.4 cm,  this was stable.  Previous formed aneurysm continued the bifurcation, but  the common iliac arteries are not aneurysmal.   CT of the pelvis was  unremarkable.  She did have M[facility] of the abdomen at [facility].  This has  been with gadolinium.  This showed nonaneurysmal disease of the aorta just  below the renal arteries.  There was a 3.4 cm aneurysm as noted above.  Diffuse atherosclerotic disease and tortuosity results was also noted.  Only  short section of the celiac access and superior mesenteric artery was  visualized, but the interior mesenteric artery was patent and could be  followed for a long distance.  The proximal celiac trunk and superior  mesenteric arteries are both normal.  She had small renal arteries  bilaterally with high grade stenosis of the proximal right renal artery and  diminished size in the right kidney.  The iliac arteries were seen.  She had  a focal 75-80% stenosis.  The artery was in the  right common iliac artery.  There is also high grade stenosis present at the arteries of the left common  ilia artery.   Further, she had a stress Cardiolite study ordered by Dr. Dorethea Clan,  cardiologist.  This was felt to be normal.   She was also seen by Dr. Dorethea Clan as mentioned above, Cardiology as mentioned  above.  She had chest and abdominal pain and felt she was atypical  presentation for coronary artery disease with significant peripheral  vascular disease.  Given her loud epigastric bruit, he worked it up with the  above tests.   I questioned carefully, however, the patient's admitting to an epigastric  pain radiating around to both flanks but noted that this only happened when  she went to stand up, and then this pain cleared within a few seconds and  started to walk.   TREATMENT:  Treatment in the hospital included IV 0.5 normal saline 100 cc  an hour which she is on for two days and then decreased to 50 cc an hour.  She had the cardiac work up as above and also cardiac echo which showed  normal reasonable global LV systolic function and mild concentric LVH.  With  IV fluids she  gradually perked up.  Strength returned and she started eating  better.   I got a better history from her in the hospital.  Apparently, according to  the family, she had not been eating well over several years with a gradual  decrease in eating and lost weight.   By her last day of hospitalization, she had much improved.  She was not  eating a lot but was able to take p.o. fluids and food.  She was very sharp,  alert, remembered oral medications and doses and what they were for and  remembered all the work up that had been done.  We still did not know  exactly what had caused her to cause hallucinations three years prior to her  hospitalization.  It felt it might be related to dehydration of her  medication.  She had been seen by Dr. Lissa Morales, neurologist, a couple of weeks  prior to hospitalization.  Dr. Lissa Morales concurred with this.   FINAL DIAGNOSIS:  1. Hypotension secondary to dehydration.  2. Dehydration.  3. Essential hypertension.  4. Meningioma, stable.  5. Right renal artery stenosis.  6. Bilateral iliac artery stenosis.  7. Abdominal pain, probably musculoskeletal.  8. Right lung cancer status post vascular CD by Dr. Edwyna Shell in the year 2000     and currently stable.   DISCHARGE MEDICATIONS:  1. Atenolol 100 mg daily.  2. HCTZ 25 mg daily.  3. Clonidine 0.1 mg b.i.d.  4. Kay-Ciel 10 mEq daily.  5. Avandia 4 mg daily.  6. Zocor 80 mg daily.  7. Ferrous sulfate 325 mg b.i.d.  8. MV daily.  9. ASA 81 mg daily.  10.      At present, we will hold the Zyprexa due to her confusional     episode.  11.      At present, will hold Lasix 40 daily, Lotrel 5/10 daily and     Minoxidil 10 mg t.i.d.   FOLLOWUP:  Follow up of her blood pressure will be in three days.  Mila Homer. Sudie Bailey, M.D.    SDK/MEDQ  D:  06/07/2002  T:  06/08/2002  Job:  [id number]

## 2010-07-21 NOTE — Procedures (Signed)
   [name], [name]                          ACCOUNT NO.:  [id number]   MEDICAL RECORD NO.:  [id number]                   PATIENT TYPE:  INP   LOCATION:  A331                                 FACILITY:  [facility]   PHYSICIAN:  Frierson Bing, M.D. [facility]           DATE OF BIRTH:  [birth year]   DATE OF PROCEDURE:  06/04/2002                  AGE:  76  DATE OF DISCHARGE:                              SEX:  F                                CARDIAC ULTRASOUND   CLINICAL DATA:  A 77 year old woman with hypertension, diabetes, and  valvular heart disease.   M-MODE:  AORTA:  2.8  (<4.0)  LEFT ATRIUM:  3.7  (<4.0)  SEPTUM:  1.5  (0.7-1.1)  POSTERIOR WALL:  1.4  (0.7-1.1)  LV-DIASTOLE:  3.8  (<5.7)  LV-SYSTOLE:  2.6  (<4.0)   CLINICAL INFORMATION:  @@   PREVIOUS STUDY:  @@   M-MODE:  AORTA:  @@  (<4.0)  LEFT ATRIUM:  @@  (<4.0)  SEPTUM:  @@  (0.7-1.1)  POSTERIOR WALL:  @@  (0.7-1.1)  LV-DIASTOLE:  @@  (<5.7)  LV-SYSTOLE:  @@  (<4.0)   FINDINGS/IMPRESSION:  1. A technically suboptimal, but adequate echocardiographic study.  2. Normal left atrium, right atrium, and right ventricle.  3. Normal aortic root size; mild annular calcification.  4. Mild aortic valvular sclerosis with mild-to-moderate insufficiency.  5. Trivial, if any, aortic stenosis.  6. Normal mitral valve; mild mitral annular calcification.  7. Normal internal dimension of the left ventricle; mild concentric LVH.     Normal regional and global LV systolic function.  8. Normal IVC.                                               [name], M.D. [facility]    RR/MEDQ  D:  06/04/2002  T:  06/05/2002  Job:  [id number]

## 2010-07-21 NOTE — Consult Note (Signed)
NAMECAELYNN, MARSHMAN                ACCOUNT NO.:  [id number]   MEDICAL RECORD NO.:  [id number]          PATIENT TYPE:  INP   LOCATION:  IC06                          FACILITY:  [facility]   PHYSICIAN:  Edward L. Juanetta Gosling, M.D.DATE OF BIRTH:  November 28, 1925   DATE OF CONSULTATION:  08/01/2004  DATE OF DISCHARGE:                                   CONSULTATION   HISTORY OF PRESENT ILLNESS:  Ms. Martie Round is a 77 year old who is brought in  from apparently a nursing home with what appeared to be respiratory  distress. She was found to be in pulmonary edema and was intubated in the  emergency room. She has been having some vomiting and some dyspnea and EMS  was brought in. She did not have chest pain. In the emergency room, she had  accelerated hypertension with blood pressure in the 190's. Chest x-ray  showed a right middle and lower lobe mass, pulmonary edema, accelerated  hypertension. She is still intubated.   PAST MEDICAL HISTORY:  Positive for diabetes, anemia, osteoporosis,  hypertension, congestive heart failure, previous right lung mass, right  humeral fracture, right hip intertrochanteric fracture, altered mental  status and paroxysmal atrial fibrillation.   PAST SURGICAL HISTORY:  She has had a vaginal hysterectomy.   SOCIAL HISTORY:  She has about a 40 pack year smoking history. No alcohol  use.   MEDICATIONS:  Unknown.   PHYSICAL EXAMINATION:  VITAL SIGNS:  On admission blood pressure 190/66,  pulse 106, respiratory rate 24, O2 saturation 87%.  HEENT:  Pupils are reactive.  NECK:  Supple without masses. She has jugular venous distention.  CHEST:  Shows rhonchi bilaterally.  HEART:  Regular without gallop.  ABDOMEN:  Soft. No masses felt.  EXTREMITIES:  No edema.   IMPRESSION:  On admission was pulmonary edema, accelerated hypertension,  hyperkalemia, and now she has elevated cardiac enzymes.   HOSPITAL COURSE:  Thus far, has been that she has had been intubated, placed  on  mechanical ventilation. She does have a history of a lung cancer that was  treated with a video-assisted thoracic surgery procedure. At any rate, she  is now on the ventilator but her potassium is 2.8, glucose 166, BUN 44,  creatinine 2.2. The latest cardiac panel showed a troponin of 16. CK MB of  31.   ASSESSMENT:  She has multiple medical problems including an acute myocardial  infarction.   PLAN:  I will plan to await the cardiology consultation and try to decide  what to do from there.      ELH/MEDQ  D:  08/01/2004  T:  08/01/2004  Job:  [id number]

## 2010-07-21 NOTE — Consult Note (Signed)
NAMEALEXIE, [name]                ACCOUNT NO.:  [id number]   MEDICAL RECORD NO.:  [id number]          PATIENT TYPE:  INP   LOCATION:  IC06                          FACILITY:  [facility]   PHYSICIAN:  Kofi A. Gerilyn Pilgrim, M.D. DATE OF BIRTH:  [birth year]   DATE OF CONSULTATION:  03/23/2005  DATE OF DISCHARGE:                                   CONSULTATION   REFERRING PHYSICIAN:  Tesfaye D. [name], M.D.   HISTORY OF PRESENT ILLNESS:  A 77 year old, African-American female who  presents with confusion.  She was found unresponsive by her husband moaning  and groaning and currently had eyes towards the left with jaw clinched.  The  patient has had previous hospitalizations with shortness of breath and  confusion.  The patient's confusion, however, improved on that visit after  her medical condition improved.  The patient had initial CT scan which was  unrevealing.  She did present hypertensive at 206/110 with pulse of 140.  This was eventually taken down with IV labetalol to 164/74.   PAST MEDICAL HISTORY:  1.  History of respiratory failure requiring ventilation.  2.  History of aspiration pneumonia.  3.  Pulmonary edema.  4.  Hypokalemia.  5.  Chronic renal insufficiency.  6.  Diabetes mellitus.  7.  Non-Q wave myocardial infarction.   MEDICATIONS:  Potassium, Reglan, Avandia, Hydralazine, Altace, metoprolol,  aspirin, Plavix, furosemide.   SOCIAL HISTORY:  She is married.  She lives at home with her husband.  Stopped consuming tobacco 7 years ago.  Apparently, smoked for about 40  years.   PHYSICAL EXAMINATION:  VITAL SIGNS:  Temperature 98.6, blood pressure  177/66, pulse 86, respirations 20.  NECK:  Supple.  HEENT:  Normocephalic, atraumatic.  EXTREMITIES:  No varicosities or edema.  Bruits are noted in the right upper  extremity.  ABDOMEN:  Soft.  NEUROLOGIC:  The patient opens her eyes to light sternal rube.  She tracks  and focuses.  She does follow midline and appendicular  commands.  Pupils are  4 mm, brisk and reactive.  Visual fields appear to be intact.  Extraocular  movements full.  Corneal reflexes are intact.  Motor examination shows  strength 2/3 in the upper extremities.  Legs are 1/5 bilaterally.  Reflexes  are preserved.  Sensory examination shows painful stimulation bilaterally.   LABORATORY DATA AND X-RAY FINDINGS:  CT scan of the brain shows atrophy with  no acute process.  MRI scan reviewed and shows marked global atrophy.  There  is moderate confluent leukoencephalopathy.  There seemed to be two focal  areas of leukoencephalopathies that go close to the cortical area involving  the occipital areas bilaterally.  No acute infarcts noted on diffusion  imaging.  Chest x-ray shows bibasilar opacities.   Hemoglobin 9, platelets 195, hemoglobin 12.7.  Sodium 132, potassium 3.7,  glucose 238, BUN 8, creatinine 1.1, calcium 9, CO2 19, chloride 103.  Liver  enzymes normal.  CPK 204 and then 402.   ASSESSMENT:  Acute encephalopathy, etiology unclear.  She may have a  metabolic process given that the magnetic resonance  imaging has been  unrevealing for an acute infarct.  Potential metabolic etiology includes  pneumonia and she has had previous bouts of shortness of breath associated  with encephalopathy.  I am concerned by the elevated CPK suggestive of a  seizure along with the history.   RECOMMENDATIONS:  1.  EEG.  2.  Prolactin level which can be high after generalized seizure.  3.  Blood pressure control.  4.  Further recommendations will follow depending on the initial results.      Kofi A. Gerilyn Pilgrim, M.D.  Electronically Signed     KAD/MEDQ  D:  03/26/2005  T:  03/26/2005  Job:  [id number]

## 2010-07-21 NOTE — Group Therapy Note (Signed)
   [name], [name]                          ACCOUNT NO.:  [id number]   MEDICAL RECORD NO.:  [id number]                   PATIENT TYPE:  INP   LOCATION:  A212                                 FACILITY:  [facility]   PHYSICIAN:  Mila Homer. Sudie Bailey, M.D.           DATE OF BIRTH:  [birth year]   DATE OF PROCEDURE:  07/22/2002  DATE OF DISCHARGE:                                   PROGRESS NOTE   SUBJECTIVE:  She is still having upper abdominal pain with movement.   OBJECTIVE:  VITAL SIGNS:  Temperature 97, pulse 56, respiratory rate 20,  blood pressure 159/79.  GENERAL:  She is alert and oriented, in no acute distress.  Thin, but well  developed.  Supine in bed this morning.  She actually looks pretty happy.  LUNGS:  Clear throughout.  HEART:  Regular rhythm, rate of 70.  ABDOMEN:  Soft, without hepatosplenomegaly or masses, there is no abdominal  tenderness.  EXTREMITIES:  There is no edema of the ankles.   ASSESSMENT:  1. Upper abdominal pain with movement, question etiology.  2. Essential hypertension.  3. Diabetes.   PLAN:  1. Continue the Verapamil 240 mg daily, atenolol 50 mg daily, Lotensin 10 mg     daily, Clonidine 0.2 mg b.i.d.  2. Continue Avandia 4 mg daily for diabetes.  3. Ibuprofen 800 mg t.i.d.  4. To the blood pressure medications add metoprolol 40 mg daily.  5. GI to see her today to evaluate for the source of abdominal pain.                                               Mila Homer. Sudie Bailey, M.D.    SDK/MEDQ  D:  07/22/2002  T:  07/22/2002  Job:  [id number]

## 2010-07-21 NOTE — Group Therapy Note (Signed)
   [name], [name]                          ACCOUNT NO.:  [id number]   MEDICAL RECORD NO.:  [id number]                   PATIENT TYPE:  INP   LOCATION:  A212                                 FACILITY:  [facility]   PHYSICIAN:  Mila Homer. Sudie Bailey, M.D.           DATE OF BIRTH:  April 08, 1925   DATE OF PROCEDURE:  07/16/2002  DATE OF DISCHARGE:                                   PROGRESS NOTE   ADDENDUM:  After seeing the patient I personally went down to radiology and  spent 15 minutes discussing with the two radiologists there about her  unusual symptoms of upper abdominal pain and the radiological abnormalities  she has.  She has several lesions in the liver but nothing else noted in the  upper abdomen.  Ultrasound has been recommended.  We actually personally  went over the CT scans together on the computer.   Then, personally discussed her case with Dr. Franky Macho, general surgeon,  and asked him to evaluate her upper abdominal pain to see if he thought this  were an intraabdominal process.                                               Mila Homer. Sudie Bailey, M.D.    SDK/MEDQ  D:  07/17/2002  T:  07/17/2002  Job:  [id number]

## 2010-07-21 NOTE — Cardiovascular Report (Signed)
[name], [name]                ACCOUNT NO.:  [id number]   MEDICAL RECORD NO.:  [id number]          PATIENT TYPE:  OUT   LOCATION:  XRAY                         FACILITY:  [facility]   PHYSICIAN:  Rollene Rotunda, M.D.   DATE OF BIRTH:  March 08, 1925   DATE OF PROCEDURE:  08/18/2004  DATE OF DISCHARGE:  08/11/2004                              CARDIAC CATHETERIZATION   PRIMARY CARE PHYSICIAN:  Tesfaye D. [name], M.D.   CARDIOLOGIST:  Thor Bing, M.D.   PROCEDURE:  Left and right heart catheterization/coronary arteriography.   INDICATIONS FOR PROCEDURE:  Evaluate patient with congestive heart failure  and non-Q-wave myocardial infarction.   DESCRIPTION OF PROCEDURE:  Left heart catheterization was performed via the  right femoral artery.  The artery was cannulated using an anterior wall  puncture.  A #6 French arterial sheath was inserted via the modified  Seldinger technique.  Right heart catheterization was performed via the  right femoral vein.  The vessel was cannulated using anterior wall puncture.  A #8 French venous sheath was inserted via the modified Seldinger technique.  Preformed Judkins and pigtail catheters were utilized.  Of note, I had  difficulty advancing the initial guide wire.  We exchanged for a Wooly wire  and a right coronary catheter.  We were able to advance the [facility] wire  without difficulty.  It was a very tortuous vessel.  All catheter exchanges  were made over a long wire without difficulty.  We did followup with an  aortogram to evaluate the distal disease afterwards.   RESULTS:  1.  Hemodynamics:  RV mean 6, PA 29/19, with a mean of 23, pulmonary      capillary wedge pressure mean of 10.  2.  Coronaries:  The left main was normal.  The left anterior descending was      large and wrapped the apex.  There were diffuse luminal irregularities.      First and second diagonal were small.  The circumflex was essentially      large in the obtuse marginal.  It  was calcified heavily in its proximal      segment.  There was a long 30% stenosis.  The right coronary artery was      the dominant vessel.  There was a long mid subtotal stenosis.  There was      scant flow through this.  There were left-to-right collaterals seen.  3.  Left ventriculogram:  The left ventriculogram was obtained in the RAO      projection.  The ejection fraction was 60% with normal wall motion.  4.  Aortogram:  Distal aortogram was obtained.  The renal arteries appeared      to be patent, though there was vessel overlap and they were not      adequately seen.  The left iliac was occluded.  There was moderate to      severe diffuse right iliac disease.  There were two focal abdominal      aneurysms.  One terminating at the renal arteries and one between the      renal  arteries and the bifurcation.   CONCLUSION:  1.  Preserved left ventricular function.  2.  Occluded right coronary artery.  3.  Minimal left system plaque, none occlusive.  4.  Severe aorto-iliac disease with aneurysms.   PLAN:  The patient will have medical management.  I suggest an abdominal  ultrasound to evaluate the extent of the abdominal aortic aneurysms.       JH/MEDQ  D:  08/18/2004  T:  08/20/2004  Job:  [id number]   cc:   Ninetta Lights D. [name], MD  [address]  [place]  [place] [address]  Fax: [phone]   Blanchard Bing, M.D.

## 2010-07-21 NOTE — Group Therapy Note (Signed)
   [name], [name]                          ACCOUNT NO.:  [id number]   MEDICAL RECORD NO.:  [id number]                   PATIENT TYPE:  INP   LOCATION:  A331                                 FACILITY:  [facility]   PHYSICIAN:  Mila Homer. Sudie Bailey, M.D.           DATE OF BIRTH:  11/07/1925   DATE OF PROCEDURE:  06/05/2002  DATE OF DISCHARGE:                                   PROGRESS NOTE   SUBJECTIVE:  The patient actually feels better today.  She has had mid  abdominal pain, worse last week than this week.  The pain would radiate  around to the sides.  She would not note it supine.  When she went to get up  she would have the pain but after being up for seconds the pain would clear.   She is now being seen by Dr. Dionicio Stall, cardiologist.  She has had a  cardiac echo and an adenosine Cardiolite - both of which were essentially  normal.  We discussed her today at length.  She does have abdominal aortic  aneurysm measuring about 3.5 cm by CAT scan.   OBJECTIVE:  Temperature today is 99.6, pulse 90, respiratory rate 20, blood  pressure 159/58.  She is in no acute distress, well-developed and thin,  oriented and alert.  The heart sounds hyperdynamic, the rate is around 80 to  90 (well auscultated, given her thin chest wall).  Lungs are clear  throughout.  The abdomen is soft without hepatosplenomegaly or mass and she  has no tenderness on abdominal palpation but she does have a 2/6 abdominal  bruit over the epigastrium.  There is no edema of the ankles.   ASSESSMENT:  1. Hypotension secondary to dehydration.  2. Dehydration.  3. Recent confusional episodes of hallucination, possibly just secondary to     dehydration.  4. Abdominal aortic aneurysm.  5. Abdominal pain, question muscle wall pain versus aneurysm versus ischemic     bowel disease.  6. Essential hypertension.  7. Lung cancer.   PLAN:  Going for MRA of the abdominal blood vessels today.  Continue with IV  fluids and  assistance with walking.  Start back on antihypertensives.   Note:  Total time spent with this patient today reviewing her paper chart,  reviewing her computer chart, talking to her in the room, examining her,  discussing with Dr. Dionicio Stall, cardiologist was 25 minutes.                                               Mila Homer. Sudie Bailey, M.D.    SDK/MEDQ  D:  06/05/2002  T:  06/06/2002  Job:  [id number]

## 2010-07-21 NOTE — Op Note (Signed)
[name], [name]                ACCOUNT NO.:  [id number]   MEDICAL RECORD NO.:  [id number]          PATIENT TYPE:  INP   LOCATION:  A223                          FACILITY:  [facility]   PHYSICIAN:  Vickki Hearing, M.D.DATE OF BIRTH:  05/03/1925   DATE OF PROCEDURE:  04/03/2005  DATE OF DISCHARGE:                                 OPERATIVE REPORT   PREOPERATIVE DIAGNOSIS:  Fractured left hip, fractured left wrist, fractured  left humerus.   POSTOPERATIVE DIAGNOSIS:  Fractured left hip, fractured left wrist,  fractured left humerus.   PROCEDURE:  Open reduction internal fixation left hip with gamma nail.   SURGEON:  Dr. Romeo Apple.   SECONDARY PROCEDURE:  Application of short-arm cast, left wrist. Surgeon was  also Dr. Romeo Apple.   ANESTHETIC:  Spinal.   OPERATIVE FINDINGS:  Comminuted displaced fracture of left intertrochanteric  region, nondisplaced fracture of left wrist.   BLOOD LOSS:  200 cc.   COMPLICATIONS:  None. Counts were reported as correct by the nurse.   INDICATIONS FOR PROCEDURE:  Displaced left pertrochanteric fracture.   The patient's leg was marked surgery, countersigned by the surgeon. History  and physical was updated. Antibiotics were started. The patient was taken  the operating room for spinal anesthetic. After successful anesthesia was  placed on the operating table where a closed manipulation was attempted on  the left hip. Closed manipulation improved the position of the fracture but  did not reduce it. We then manipulated the fracture again, and I was able to  obtain a close reduction.   We took a time-out just prior to manipulation after the leg was prepped and  draped. We then made an incision proximal to the greater trochanter, passed  a curved awl, passed a guidewire passed the nail. At this point, it was  noted the fracture had lost position in the lateral view. We then opened the  fracture, placed a Lane clamp on the anterior part of the neck  and reduced  the fracture, passed the guide pin into the femoral head through a separate  stab incision, measured and then over reamed to a 95-mm length, passed a 95-  mm lag screw. We then placed the proximal locking bolts and then the distal  locking bolts. We took radiographs AP and lateral of the hardware and the  fracture. Fracture reduction was satisfactory. Wound was irrigated, closed  in layer fashion with #1 Bralon and 2-0 Monocryl. Injected 30 cc of  Marcaine. This was tolerated well, and the wound was dressed sterilely.   We then placed a short-arm cast on the left wrist. The patient was taken to  the recovery room in stable condition.      Vickki Hearing, M.D.  Electronically Signed     SEH/MEDQ  D:  04/04/2005  T:  04/04/2005  Job:  [id number]

## 2010-07-21 NOTE — Consult Note (Signed)
NAMEDECLAN, [name]                ACCOUNT NO.:  [id number]   MEDICAL RECORD NO.:  [id number]          PATIENT TYPE:  ORB   LOCATION:  S105                          FACILITY:  [facility]   PHYSICIAN:  J. Darreld Mclean, M.D. DATE OF BIRTH:  November 28, 1925   DATE OF CONSULTATION:  07/08/2005  DATE OF DISCHARGE:                                   CONSULTATION   CHIEF COMPLAINT:  __________.   She is at the __________ nursing center.  The patient called me this morning  stating the patient fell late Friday night with pain and tenderness to the  low hip.  X-rays taken showing nondisplaced fracture in the greater  trochanter around the __________ nail on the left due to recent gamma nail  done in January or the first of February this year.  Nail is intact, and  there is no loosening apparent on x-ray.  The patient is status post  fracture of her shoulder and wrist, and these were not injured.  No other  injuries.  Neurovascularly intact.  She has some tenderness to the hip.  Passive motion is slightly tender.  She is alert and oriented.  Nephew came  and I seen him and I talked to him as well.   IMPRESSION:  Fracture of greater trochanter around a gamma nail on the left  hip after review.   FOLLOWUP:  Bedrest, and in 14 days we will repeat x-rays on Monday May 14.  __________ 40 mg subcu daily while she is at bedrest.           ______________________________  Shela Commons. Darreld Mclean, M.D.     JWK/MEDQ  D:  07/08/2005  T:  07/09/2005  Job:  [id number]   cc:   Kingsley Callander. Ouida Sills, MD  Fax: [phone]   Tesfaye D. [name], MD  Fax: [phone]

## 2010-07-21 NOTE — Group Therapy Note (Signed)
   [name], [name]                          ACCOUNT NO.:  [id number]   MEDICAL RECORD NO.:  [id number]                   PATIENT TYPE:  INP   LOCATION:  A212                                 FACILITY:  [facility]   PHYSICIAN:  Mila Homer. Sudie Bailey, M.D.           DATE OF BIRTH:  [birth year]   DATE OF PROCEDURE:  DATE OF DISCHARGE:                                   PROGRESS NOTE   SUBJECTIVE:  The patient feels fine as long as she does not move.  She had a  lot of pain last night just turning in bed.  It is still affecting her under  the ribs bilaterally.   OBJECTIVE:  Her temperature is 97.4, pulse 57, respiratory rate 20, 162/85.  She is oriented and alert.  In no acute distress tonight as long as she does  not move.  She is well-developed, well-nourished, somewhat thin, elderly  woman.  Heart has a fairly regular rhythm, rate of about 80.  The lungs are  clear throughout.  She is moving air well.  There is no tenderness to  palpation on the upper abdomen or anywhere else through the abdomen.  There  is no edema of the ankles.  Whole-body bone scan showed increased activity  in the left third and fourth ribs anteriorly as well as increased activity  at the level of L4.  Also, there is increased activity of the shoulders,  sternoclavicular joints, hips, knees, feet, SI joints, hands, possibly  secondary to degenerative changes.   ASSESSMENT:  Persistent upper abdominal pain noted only with movement.   PLAN:  Will ask GI to see her.  The patient is now being seen by cardiology  and general surgery.  The cause of her pain really remains unknown.                                               Mila Homer. Sudie Bailey, M.D.    SDK/MEDQ  D:  07/21/2002  T:  07/22/2002  Job:  [id number]

## 2010-07-21 NOTE — Group Therapy Note (Signed)
[name], [name]                          ACCOUNT NO.:  [id number]   MEDICAL RECORD NO.:  [id number]                   PATIENT TYPE:  INP   LOCATION:  A331                                 FACILITY:  [facility]   PHYSICIAN:  Mila Homer. Sudie Bailey, M.D.           DATE OF BIRTH:  1925/07/17   DATE OF PROCEDURE:  06/05/2002  DATE OF DISCHARGE:                                   PROGRESS NOTE   SUBJECTIVE:  The patient says that she is feeling well.  Show notes some  upper abdominal and flank pain when she goes from supine to sitting to  standing, and then after walking for a few seconds the pain clears.  Appetite is still somewhat down.   I talked, at length, over at least 20 minutes with the patient's husband and  also the patient's daughter-in-law.  The daughter-in-law was very concerned  about her gradual weight loss over the last several years. She says that she  eats less than a bird.   The family told me that Dr. Orlin [name], neurologist that the patient was  referred to 3 weeks ago for confusion, called back to the house and left a  message that the patient's confusion might be medication related.   I started her back on Atenolol 50 mg daily and HCTZ 25 mg daily yesterday as  her BP was starting to come up.  The full risk scale shows a rating of 12  down from 20.   OBJECTIVE:  This morning she is oriented and alert, supine in bed. She is  well-developed and thin.  The temperature is 98.8.  the pulse is 70,  respiratory rate 18, blood pressure 134/72.  Glucose has been 112, 168, 111,  and 94 during the course of the last 24 hours.  The lungs are clear  throughout, moving air well.  The heart has a fairly regular rhythm with a  rate of about 80.  The abdomen is soft without hepatosplenomegaly or mass,  and certainly no abdominal pain on palpation.  There is no edema of the  ankles.  Mucous membranes appear moist.   ASSESSMENT:  1. Hypotension, improved with IV fluids.  2. Abdominal  pain probably musculoskeletal.  3. Systolic hypertension, currently well controlled on antihypertensives.  4. Inanition over the years possibly related to medications, possibly     related to her cutting out some of the foods that she has enjoyed, such     as potato chips.  This may be a taste issue; smell issue too.   PLAN:  We are discontinuing her IVs, switch to Hep-Lock.  We are getting her  up in a chair twice a day and ambulate with assistance.  I have talked to  radiology and they are getting the full typed report on her MRA of the  abdomen to [place], this morning for my review.  I discussed all of this with the  family at length.   I spent 30 minutes total on this patient today including review of all her  records on the computer including there vital signs, nursing assessment, the  Braden scale, full risk assessment, and food intake.  I also reviewed her  paper chart, talked extensively with the family as mentioned above, and  arrived at diagnostic conclusions and a plan of action.                                               Mila Homer. Sudie Bailey, M.D.    SDK/MEDQ  D:  06/06/2002  T:  06/07/2002  Job:  [id number]

## 2010-07-21 NOTE — Consult Note (Signed)
[name], [name]                          ACCOUNT NO.:  [id number]   MEDICAL RECORD NO.:  [id number]                   PATIENT TYPE:  INP   LOCATION:  A212                                 FACILITY:  [facility]   PHYSICIAN:  Lionel December, M.D.                 DATE OF BIRTH:  1925/12/09   DATE OF CONSULTATION:  07/22/2002  DATE OF DISCHARGE:                                   CONSULTATION   PHYSICIAN REQUESTING CONSULTATION:  Dr. Mila Homer. Sudie Bailey,.   REASON FOR CONSULTATION:  Abdominal pain associated with movement.   HISTORY OF PRESENT ILLNESS:  The patient is a pleasant 77 year old black  female with multiple medical problems including hypertension, peripheral  vascular disease, history of right lung cancer, status post VATS in 2000,  diabetes mellitus, mild anemia, who is now on her seventh day of  hospitalization for unexplained upper abdominal pain associated with  movement.  The patient states the day of admission she developed the acute-  onset bilateral lower rib cage pain, this pain is now more prominent on the  right side.  She states it only hurts when she moves.  As long as she lies  still, there is no discomfort.  The discomfort is not reproducible by  palpation.  She does complain of a poor appetite with a 35-pound weight loss  over the last one year.  She also has a history of mild anemia and reports  recently having three negative stool Hemoccult cards.  She denies any nausea  or vomiting, abdominal pain, heartburn, dysphagia, odynophagia,  constipation, diarrhea, melena or rectal bleeding.  She has never had an  upper endoscopy or colonoscopy.  She is currently on Protonix, Motrin and  Darvocet with no improvement of her symptoms.   On admission, she had a hemoglobin of 10.5, hematocrit 30.6, stable from  March H&H, WBC 4.3, platelets 191,000; sodium 134, potassium 3.9, BUN 7,  creatinine 0.7, glucose 139, total bilirubin 0.8, alkaline phosphatase 75,  SGOT 20,  SGPT 12, albumin 3.6, amylase 72, lipase 25; total CK 74, CK-MB  1.1, troponin 0.04.   Since admission, she had a CT of the abdomen and pelvis which revealed small  bilateral pleural effusions. She had four lesions seen in the liver which  were further evaluated with abdominal ultrasound, the largest found to be a  hemangioma, the other three too small to characterize.  She also had an  abdominal aortic aneurysm measuring 3.6.  There was no biliary dilatation.  She also had spinal films which revealed a wedge compression fracture at T8  and L1, osteoporosis.  Chest x-ray on Jul 17, 2002 revealed cardiomegaly  with LVH and a small pericardial effusion and small left pleural effusion,  COPD.  She did have an echocardiogram which revealed a mild A-V sclerosis  with trivial stenosis from a mild-to-moderate insufficiency, no LVH  detected.  She had an  abdominal ultrasound on Jul 17, 2002 which again  revealed a 1.6-cm hemangioma of the liver, tiny lesions in the dome of the  liver too small to characterize and AAA, 3.6 cm.  Bone scan on Jul 20, 2002  revealed two areas of increased activity on left second and third ribs,  questionable old fractures, increased activity at L4-L5, questionable  compression fracture, degenerative changes in major joints.  She also had an  MRA of the abdomen, April 2004, which revealed abdominal aortic aneurysm  measuring 3.4 cm with diffuse atherosclerotic disease.  A short segment of  proximal celiac and SMA appeared okay but distally could not be seen.  IMA  was patent.  She had a high-grade stenosis of the right renal artery as well  as high lateral iliac arteries.   MEDICATIONS PRIOR TO ADMISSION:  1. Atenolol 100 mg daily.  2. Hydrochlorothiazide 25 mg daily.  3. Clonidine 0.1 mg b.i.d.  4. Potassium chloride 10 mEq daily.  5. Avandia 4 mg daily.  6. Zocor 80 mg daily.  7. Ferrous sulfate 325 mg b.i.d.  8. Multivitamin daily.  9. Aspirin 81 mg  daily.   ALLERGIES:  PENICILLIN.   PAST MEDICAL HISTORY:  Recent admission for hypotension secondary to  dehydration, April 2004.  She recently had an episode of confusion with  hallucinations, followed by Dr. Gustavus Messing. Weymann.  She has hypertension,  stable meningioma, bilateral iliac artery stenosis, right lung cancer,  status post VATS by Dr. Ines Bloomer in 2002, right renal artery  stenosis with small right kidney, abdominal aortic aneurysm measuring 3.6  cm, atherosclerosis of the abdominal aorta as seen on recent MRA, diabetes  mellitus, recent normal Cardiolite, echocardiogram as outlined above, mild  anemia, hypercholesterolemia, status post hysterectomy, appendectomy and  abdominal surgery for a benign mass.   FAMILY HISTORY:  She has a sister with breast cancer and one with a stroke.  No family history of chronic GI illnesses or colorectal cancer.   SOCIAL HISTORY:  She is married and has one child.  She quit smoking  cigarettes years ago, denies any alcohol use.   REVIEW OF SYSTEMS:  Please see HPI for GI and for GENERAL.  CARDIOPULMONARY:  Denies any shortness of breath or chest pain.   PHYSICAL EXAMINATION:  VITAL SIGNS:  Height 5 feet 5 inches.  Weight 188.2.  Temperature 97, pulse 56, respirations 20, blood pressure 159/79.  GENERAL:  A pleasant thin black female in no acute distress.  SKIN:  Skin warm and dry.  No jaundice.  HEENT:  Conjunctivae are pink.  Sclerae nonicteric.  Oropharyngeal mucosa  moist and pink.  No lesions, erythema or exudate.  NECK:  No lymphadenopathy or thyromegaly.  CHEST:  Lungs are clear to auscultation.  CARDIAC:  Exam reveals regular rate and rhythm, normal S1 and S2, a short  2/6 systolic ejection murmur heard best in the upper precordium.  ABDOMEN:  Positive bowel sounds.  Soft, nontender and nondistended.  No  organomegaly or masses.  I could not reproduce any pain in the lower rib  cage area, even with  palpation. EXTREMITIES:  No edema.   IMPRESSION:  The patient is a 77 year old lady with now a seven-day history  of bilateral lower rib cage pain associated only with movement.  Etiology  remains unclear.  There apparently was a suspicion of old fractures of left  second and third ribs on recent bone scan, as well as a wedge compression  fracture of  T8 on spinal films.  There do not appear to be any acute  fractures, however.  Etiology of her pain remains unclear, but I do not  suspect it to be related to a gastrointestinal etiology.  However, we do  feel that peptic ulcer disease needs to be excluded, given her pain as well  as anorexia, weight loss and anemia.   SUGGESTIONS:  1. EGD as next step.  2. Continue PPI.   I would like to thank Dr. Sudie Bailey for allowing [place] to take part in the care  of this patient.     Tana Coast, P.A.                        Lionel December, M.D.    LL/MEDQ  D:  07/22/2002  T:  07/22/2002  Job:  [id number]   cc:   Mila Homer. Sudie Bailey, M.D.  [address] [place], [place] [address]  Fax: [phone]   Lionel December, M.D.  [address]  [place]  [place] [address]  Fax: [phone]

## 2010-07-21 NOTE — Procedures (Signed)
[name], [name]                ACCOUNT NO.:  [id number]   MEDICAL RECORD NO.:  [id number]          PATIENT TYPE:  INP   LOCATION:  IC06                          FACILITY:  [facility]   PHYSICIAN:  Vida Roller, M.D.   DATE OF BIRTH:  01/03/1926   DATE OF PROCEDURE:  03/23/2005  DATE OF DISCHARGE:                                  ECHOCARDIOGRAM   TAPE NUMBER:  [id number].  Tape count [id number].   INDICATIONS:  This is a 77 year old woman for LV systolic function.  Previous echocardiogram from Jul 07, 2004, shows depressed LV systolic  function, multiple wall motion abnormalities, normal RV systolic function,  moderate aortic insufficiency and mild mitral regurgitation. Today's study  is technically difficult as the patient was combative and not cooperative  with the study.   M-MODE TRACINGS:  Aorta is 33 mm.   Left atrium is 34 mm.   Septum is 16 mm.   Posterior wall is 17 mm.   Left ventricular diastolic dimension 43 mm.   Left ventricular systolic dimension 27 mm.   Two-dimensional echocardiogram and Doppler imaging:  The left ventricle is  normal size.  There is preserved LV systolic function with an ejection  fraction of about 60-65%.  There is moderate concentric left ventricular  hypertrophy.  There are no wall motion abnormalities seen.  Right ventricle  appears to be normal size.  There is mild biatrial enlargement.   The aortic valve is sclerotic with very mild aortic stenosis.  Peak gradient  of 14 mmHg.  There is moderate aortic insufficiency.  This was difficult to  quantify due to the patient's uncooperativeness.   The mitral valve is severely calcified at its annulus with no mitral  stenosis, trace mitral regurgitation.   There is no pericardial effusion.      Vida Roller, M.D.  Electronically Signed     JH/MEDQ  D:  03/23/2005  T:  03/23/2005  Job:  [id number]

## 2010-07-21 NOTE — H&P (Signed)
[name], [name]                          ACCOUNT NO.:  [id number]   MEDICAL RECORD NO.:  [id number]                   PATIENT TYPE:  INP   LOCATION:  A212                                 FACILITY:  [facility]   PHYSICIAN:  Mila Homer. Sudie Bailey, M.D.           DATE OF BIRTH:  06-13-1925   DATE OF ADMISSION:  07/15/2002  DATE OF DISCHARGE:                                HISTORY & PHYSICAL   HISTORY:  This 77 year old had severe sudden onset of severe upper abdominal  pain this morning while she was making breakfast.  She was standing up at  the time.  She had to sit down and stop, was brought to the emergency room.  She has never had pain this severe before.  It seemed to be felt way up  underneath both rib cages both right and left.  She was thought to respond  to nitroglycerin but the patient herself said she had no response to  nitroglycerin and during the course of the day she has continued with upper  abdominal pain. She has a long complicated medical history.  About two  months ago a confusion episode at home in which she had a visual  hallucination.  She was then seen by Dr. Orlin Hilding, neurologist [facility].  She ended up with admission to this hospital about a month ago, discharged  April 4.  At that time she had hypotension secondary to dehydration.  She  cleared with IV fluids.  She also has a history of hypertension which has  been very difficult to treat, a stable meningioma, bilateral iliac artery  stenosis, right lung cancer status post a VATS procedure by  Dr. Edwyna Shell in [place] in 2000.  Even during that hospitalization she  mentioned that she would have some upper abdominal pain after standing up  and starting to walk but this had cleared within a few steps.  She has had  numerous abdominal CT scans and MRAs in the last month.  The abdominal CT  shows extensive atherosclerotic vascular disease about the abdomen.  She has  infrarenal abdominal aortic aneurism measuring  3.6 cm x 3. 6 cm.  A prior  ultrasound measured this as 3.8 x 3.4 cm.  She had an MRA of the abdomen a  month ago which showed mild aneurysmal disease measuring 3.4 cm just below  the level of the renal arteries.  There was tortuosity of the entire  abdominal aorta with diffuse atherosclerotic disease.  The inferior  mesenteric artery was open and there was no significant  proximal vessel  mesenteric disease but the visualization was suboptimal.  She has a high  grade fossa right renal artery stenosis that is associated with a small  right kidney, compared to the left.  The common iliac artery shows 75-80%  stenosis bilaterally.   DISCHARGE MEDICATIONS:  Her discharge medications from last month include:  1. Atenolol 100 mg daily.  2.  HCTZ 25 mg daily.  3. Clonidine 0.1 mg b.i.d.  4. KCl 10 mEq daily.  5. Avandia 4 mg daily.  6. Zocor 80 mg daily.  7. Ferrous sulfate 325 mg b.i.d.  8. ASA 81 mg daily.  9. ________ daily.  10.      She had been on Lasix 40 daily,  11.      Lotrel 5/10 daily.  12.      ______ 10 t.i.d. and all of these were held at that point.  13.      Zyprexa was also held due to her prior confusional episode.   PHYSICAL EXAMINATION:  In the hospital at this time,  GENERAL:  Shows an elderly woman still complaining of upper abdominal pain.  She appears to be alert and oriented tonight.  She is thin.  VITAL SIGNS:  Her temperature is 98.4, pulse 78, respiratory rate 20, blood  pressure 148/92.  O2 sat 99% on 2 liters.  LYMPHATICS:  There is no axillary or supraclavicular adenopathy.  LUNGS:  Appear clear with decreased breath sounds.  HEART:  Regular rhythm rate about 70.  ABDOMEN:  Soft without hepatosplenomegaly or mass.  Minimal tenderness on  palpation under both right and left upper ribs.  There is no edema in the  ankles.  BACK:  Examination of the thoracic spine showed no significant  pain on  palpation of the spinous processes.   ASSESSMENT:  1. Right  upper quadrant and left upper quadrant abdominal pain, question     etiology.  2. Extensive atherosclerosis of the abdominal vessels.  3. Abdominal aortic aneurism.  4. Essential hypertension.  5. Right renal artery stenosis with decreased size of the right kidney.  6. Stable meningioma.  7. Bilateral iliac artery stenosis.  8. Right lung cancer status post VATS procedure.  9. Benign mild anemia.  10.      Diabetes.   PLAN:  Plan of treatment at this point is treat her pain and redo an  abdominal CT to make sure she is not having a leaking abdominal aortic  aneurism.  Also she will need thoracic spine x-rays to rule out vertebral  compression fracture.  Discussed all this with her.  Time spent with her  evaluation, management including review of her electronic medical record,  paper record, discussion with her in the room, examination, discussion with  nurse was one hour.                                                 Mila Homer. Sudie Bailey, M.D.    SDK/MEDQ  D:  07/15/2002  T:  07/15/2002  Job:  [id number]

## 2010-07-21 NOTE — H&P (Signed)
[name], [name]                ACCOUNT NO.:  [id number]   MEDICAL RECORD NO.:  [id number]          PATIENT TYPE:  INP   LOCATION:  IC06                          FACILITY:  [facility]   PHYSICIAN:  Melvyn Novas, MDDATE OF BIRTH:  December 10, 1925   DATE OF ADMISSION:  07/30/2004  DATE OF DISCHARGE:  LH                                HISTORY & PHYSICAL   The patient is a 77 year old black female brought in in respiratory extremis  and severe pulmonary edema and promptly intubated in the ER.  All the  history is taken from the husband and the old chart.  Apparently she had  been having some vomiting and some dyspnea, and EMS was summoned.  There is  no history antecedent chest pain.  She had accelerated hypertension in the  ER, a systolic of 190/100.  She was subsequently intubated.  Chest x-ray  reveals a right middle and right lower lobe mass.  There is a reference in  the old chart to a right lung mass which was nonmalignant.  I do not know  if this is new or old at present.  She has possible infiltrates and is  admitted for pneumonia, pulmonary edema, accelerated hypertension, rule out  myocardial infarction.  Ventricular function in perusing the old chart is  unknown at this time.  We will work to hemodynamically stabilize her and  then work up possible lung mass.  She will be treated with dual antibiotics,  ventilator and IV beta blockers, IV nitrates and clonidine.  She was on a  multitude of medicines, and the husband is not sure if she was compliant.   PAST MEDICAL HISTORY:  Significant for diabetes, anemia, osteoporosis,  hypertension, mild CHF, question of right lung mass, right humeral fracture,  right hip intertrochanteric fracture, ventricular ectopy, altered mental  status secondary to electrolyte abnormalities and paroxysmal atrial  fibrillation.   PAST SURGICAL HISTORY:  Remarkable for vaginal hysterectomy.   She has no known allergies.  She smoked one pack a day  for 40 years.  She  quit 7 years ago.   CURRENT MEDICATIONS:  Unknown by the husband.  However, in the old chart  there are minoxidil 10 t.i.d., Avandia 4 daily, Lasix 40 b.i.d., clonidine  0.2 t.i.d., atenolol 100 mg a day.   PHYSICAL EXAMINATION:  VITAL SIGNS:  Blood pressure at present is 190/66,  pulse 106 and regular, respiratory rate 24.  O2 sat on the vent is 87%, much  improved.  HEAD:  Normocephalic, atraumatic.  EYES:  PERRLA.  Extraocular movements are intact.  Sclerae are clear.  Conjunctivae are pink.  NECK:  Shows possible jugular venous distention.  No carotid bruits.  No  thyromegaly.  No thyroid bruits.  LUNGS:  Show carotid bruits, bibasilar crepitations, prolonged expiratory  phase, good air entry on both sides with the ET tube in good position.  HEART:  Regular rhythm with S3 positive, S4 inaudible due to noise.  No  heaves, thrills or rubs are appreciable.  ABDOMEN:  Soft, nontender.  Bowel sounds are normoactive without rebound,  mass or  megaly.  EXTREMITIES:  Showed mild pedal edema.  Peripheral pulses are 2+ and intact  bilaterally.  NEUROLOGIC:  The patient moves all four extremities.  Plantars are  downgoing.   IMPRESSION:  1.  Pulmonary edema.  2.  Accelerated hypertension.  3.  Right middle lobe and right lower lobe possible mass or infiltrate.  4.  Hypokalemia with a potassium of 3.2.  5.  Clinically positive cardiac enzymes with negative troponin, mildly      elevated myoglobin and MB fraction.  6.  Diabetes.   PLAN:  Admit to the ICU, IV Tridil, Lovenox, dual antibiotics in the form of  Zithromax and Rocephin.  We will apply clonidine TTS patch to control blood  pressure, assess hemodynamic stability, rule out myocardial infarction, get  a 2-D echo for ventricular function, and I will make further recommendations  as the database expands.      RMD/MEDQ  D:  07/30/2004  T:  07/30/2004  Job:  [id number]

## 2010-07-21 NOTE — Group Therapy Note (Signed)
   [name], [name]                          ACCOUNT NO.:  [id number]   MEDICAL RECORD NO.:  [id number]                   PATIENT TYPE:  INP   LOCATION:  A329                                 FACILITY:  [facility]   PHYSICIAN:  Mila Homer. Sudie Bailey, M.D.           DATE OF BIRTH:  1925/10/09   DATE OF PROCEDURE:  10/31/2002  DATE OF DISCHARGE:                                   PROGRESS NOTE   SUBJECTIVE:  Generally she is feeling well today.  She has no specific  complaints.   OBJECTIVE:  Temperature 100.6, pulse 70, respiratory rate 24, blood pressure  148/62.  She is sitting up in bed with a marked thoracic kyphosis, thin,  frail, elderly woman who appears to be oriented and alert.  The lungs are  clear throughout, moving air well.  The heart has a regular rhythm, rate of  about 70.  The heart sounds somewhat distant.  The abdomen is soft without  tenderness, without hepatosplenomegaly or mass.  She has bilateral lower leg  compression devices.  Still has a good deal of pain in the right shoulder  and the right hip.   ASSESSMENT:  1. Status post fracture of the right humerus.  2. Status post intertrochanteric fracture of the right hip.  3. Severe osteoporosis.  4. Hypertension.  5. Diabetes.   PLAN:  Continue albuterol 2.5 q.i.d. by inhaler, atenolol 100 mg daily,  clonidine 0.2 mg t.i.d., furosemide 40 mg daily, minoxidil 10 mg t.i.d., KCl  20 mEq t.i.d., Arixtra 2.5 mg q.24h., and rosiglitazone 4 mg daily.                                               Mila Homer. Sudie Bailey, M.D.    SDK/MEDQ  D:  10/31/2002  T:  10/31/2002  Job:  [id number]

## 2010-07-21 NOTE — Consult Note (Signed)
NAMEJACYNDA, [name]                ACCOUNT NO.:  [id number]   MEDICAL RECORD NO.:  [id number]          PATIENT TYPE:  INP   LOCATION:  IC06                          FACILITY:  [facility]   PHYSICIAN:  Vickki Hearing, M.D.DATE OF BIRTH:  [birth year]   DATE OF CONSULTATION:  03/27/2005  DATE OF DISCHARGE:                                   CONSULTATION   REFERRING PHYSICIAN:  Tesfaye D. Felecia [name], M.D.   REASON FOR CONSULTATION:  Multiple fractures.   CHIEF COMPLAINT:  Pain in left hip, left wrist and left shoulder.   HISTORY OF PRESENT ILLNESS:  This patient is 77 years old.  She is status  post open treatment internal fixation of the right hip which was done  elsewhere approximately 1 year ago and presented at this time with  hypertensive crisis on January 19.  On January 22, after the patient's blood  pressure was somewhat stabilized, it was noted that she had deformity of the  left lower extremity and pain in the left upper extremity.  Multiple  radiographs were obtained and intertrochanteric fracture of the left hip was  noted.  This is a basocervical type fracture that appears to be a two-part  fracture and possibly three-part fracture.  She has a nondisplaced left  proximal humerus fracture at the surgical neck and a nonangulated, but  translated distal radius fracture on the left wrist.   Other history and physical has been obtained from the medical record and is  incorporated by reference and has been reviewed.   All radiographs have been reviewed.   ASSESSMENT:  Orthopedically, this patient is noted to have a nonsurgical  fracture of the left proximal humerus, a translated, but nonangulated  fracture of the left wrist and a surgical fracture of the left hip.  However, her hemoglobin is 9, platelet count only 118,000.  She is on Plavix  and still in the intensive care unit.  She has been evaluated medically and  by cardiology.   RECOMMENDATIONS:  In order for surgery to take  place, she will need to be  off Plavix x5 days.  Lovenox can be substituted.  She will need INR, PT, PTT  until they are normal.  She will have to have platelets on standby for  surgery.  She will need transfusion 1 day prior to surgery to get her  hemoglobin up above 10 or 11.   In the meantime, she can have traction on her left hip, splint on her left  wrist and a sling for the left shoulder.     Vickki Hearing, M.D.  Electronically Signed    SEH/MEDQ  D:  03/27/2005  T:  03/27/2005  Job:  [id number]

## 2010-07-21 NOTE — H&P (Signed)
NAMEANTANISHA, MOHS                          ACCOUNT NO.:  [id number]   MEDICAL RECORD NO.:  [id number]                   PATIENT TYPE:  INP   LOCATION:  IC06                                 FACILITY:  [facility]   PHYSICIAN:  J. Darreld Mclean, M.D.              DATE OF BIRTH:  December 30, 1925   DATE OF ADMISSION:  10/25/2002  DATE OF DISCHARGE:                                HISTORY & PHYSICAL   HISTORY OF PRESENT ILLNESS:  The patient is a 77 year old female who fell on  her way into church today.  She said her hip just gave way.  She had severe  pain at the time she fell and hit her right shoulder, right side of her  face.  There was no loss of consciousness.  Subsequently, brought to the  hospital.  X-rays revealed a displaced intratrochanteric fracture of the  right hip, fracture of the right proximal humerus, negative facial problems  and she has early mild congestive heart failure.  The patient has been given  some sedation.  I do not have a complete list of her medications that she is  on at home at the present time.  Family has been in and out, but they do not  know all the medicines she is on either.  Dr. Delton See has been consulted and  will wait for him to complete that part.  The patient says she is not  hurting anywhere else.   PAST MEDICAL/SURGICAL HISTORY:  The patient gives a history of a  hysterectomy in the past and eye surgery in the past.  History of congestive  heart failure and there is a history of lung lesion, 2.6 cm in the right  upper lobe noted 1999, according to old records, but there was no evidence  of any malignancy.  Previous mammograms have been negative as well.  She has  been seen and evaluated for carotid arteriogram as well as distal  arteriograms.  The patient says she has been in good health until recently,  but she has had some swelling of her feet recently.  She has a long history  of irregular heart beat.   Her husband is present, her brother is  present.  Her husband says she is  taking iron supplement, atenolol, Lasix, Catapres and another medication he  cannot remember.   ALLERGIES:  She denies any allergies.   LABORATORY DATA:  Potassium 3.1, glucose 160, hemoglobin 9.8, pO2 on room  air 49.4.  She has been placed on supplemental O2.   PHYSICAL EXAMINATION:  GENERAL:  The patient is slightly confused, having  had some pain medication, but she knows she is at the hospital.  She knows  who I am.  She knows the nurses.  She is in pain.  HEENT:  Negative except for some swelling on her right forehead and the  right lateral side of her face, small abrasion.  NECK:  Supple.  LUNGS:  Clear to percussion and auscultation.  HEART:  Irregular, irregular rhythm with a 2/6 systolic ejection murmur.  ABDOMEN:  Soft, nontender.  MUSCULOSKELETAL:  Pain and tenderness in the right hip area.  Pain and  tenderness in the right shoulder.  Some swelling to the shoulder.  EXTREMITIES:  Bilateral peripheral edema.  Pulses are present but difficult  to feel.  NEUROLOGIC:  She has confusion, but the nurses admit the confusion started  after the pain medication.  SKIN:  Intact except for the abrasions.   IMPRESSIONS:  1. Acute fracture, right hip, intratrochanteric.  2. Acute fracture, right proximal humerus.  3. Mild congestive heart failure.  4. Peripheral edema secondary to congestive heart failure.  5. Hypokalemia with potassium 3.1.  6. Decreased oxygenation secondary to congestive heart failure.  7. Anemia, hemoglobin 9.8.  8. Mild elevation of serum glucose to 160.   PLAN:  The patient will be admitted to the Intensive Care Unit, nasal O2,  Foley catheter.  I would like Dr. Delton See to see her before we consider any  possibility of surgery and she will need OTIF of the right hip.  I have  discussed this with her husband.  First will need an okay medically for her  congestive heart failure and heart arrhythmias prior to any procedure.   She  is an increased risk.                                               Teola Bradley, M.D.    JWK/MEDQ  D:  10/25/2002  T:  10/25/2002  Job:  [id number]

## 2010-07-21 NOTE — Group Therapy Note (Signed)
   [name], [name]                          ACCOUNT NO.:  [id number]   MEDICAL RECORD NO.:  [id number]                   PATIENT TYPE:  INP   LOCATION:  A212                                 FACILITY:  [facility]   PHYSICIAN:  [name], M.D.           DATE OF BIRTH:  28-Jan-1926   DATE OF PROCEDURE:  07/20/2002  DATE OF DISCHARGE:                                   PROGRESS NOTE   SUBJECTIVE:  Generally she is well, but still every time that she moves she  is having severe pain under the rib cage.   OBJECTIVE:  Temperature 96.8, pulse 58, respiratory rate 20, blood pressure  198/63.  Heart has a regular rhythm with a rate of 70.  The lungs are clear  throughout, moving air well.  The abdomen is soft without hepatosplenomegaly  or masses.  There is no tenderness in either the right upper quadrant or the  left upper quadrant on deep palpation.  There is no tenderness on palpation  of the ribs anteriorly particularly the subcostal margins.  So I tried to  have her turn over, though she has severe pain in the upper abdomen and  persisted this without pain.  There is on edema of the ankles.   ASSESSMENT:  1. Severe abdominal pain, still probably secondary to a musculoskeletal     problem such as vertebral fracture.  2. Severe abdominal vessel atherosclerosis.  3. Essential hypertension.  4. Diabetes.   PLAN:  Bone scan is pending.  Continue the atenolol 50 mg daily, Lotensin 10  mg daily, clonidine 0.2 mg b.i.d. we are taking her off amlodipine and  switching her to verapamil 240 mg daily.  If her pressure does not come down  with this we will need to add other agents.  Continue the Avandia 4 mg daily  for diabetes and the ibuprofen 800 mg t.i.d. with metoprolol 40 mg daily.                                               [name], M.D.    SDK/MEDQ  D:  07/20/2002  T:  07/20/2002  Job:  [id number]

## 2010-07-21 NOTE — H&P (Signed)
NAMELESLEY, ATKIN                          ACCOUNT NO.:  [id number]   MEDICAL RECORD NO.:  [id number]                   PATIENT TYPE:  INP   LOCATION:  IC06                                 FACILITY:  [facility]   PHYSICIAN:  Mila Homer. Sudie Bailey, M.D.           DATE OF BIRTH:  Aug 01, 1925   DATE OF ADMISSION:  10/25/2002  DATE OF DISCHARGE:                                HISTORY & PHYSICAL   HISTORY OF PRESENT ILLNESS:  This is a 77 year old woman who was at home  today, when she fell, fracturing her right humeral head and having a right  intertrochanteric fracture of the hip.  She has had a long history of  osteoporosis and recently, was down in [place] at [facility],  treated by interventional radiologist there with a vertebroplasty for a  vertebral fracture.  She has had other vertebral fractures causing chronic  pain.  She did have an episode of confusion several months ago.  She thought  she was seeing people who were not present.  This was never fully  understood, but according to her husband, even though she has done well for  awhile, in the last four or five days become confused again.  Again, was  having visual hallucinations.   She was having fairly severe abdominal pains.  Workup for this showed very  poor blood flow through a lot of the intestinal arteries.  At present, this  has not been a problem.   MEDICATIONS:  Medications were not brought with her husband to the hospital  and he is going to be bringing these.   PHYSICAL EXAMINATION:  GENERAL:  She is seen in the ER today.  She is flat  on her back on a stretcher.  Appears to be alert, possibly oriented.  She is  thin, elderly, but well-developed.  There is an obvious shortening, external  rotation of the right foot and leg and tenderness in the right shoulder.  LUNGS:  Clear throughout, although there are decreased breath sounds  throughout.  HEART:  Regular rhythm, rate about 70.  ABDOMEN:  Soft,  without hepatosplenomegaly or masses.  No abdominal  tenderness.  EXTREMITIES:  There was about 1+ pitting edema of the distal legs.  VITAL SIGNS:  Temperature 98.3, pulse 64, respiratory rate 20, blood  pressure 175/70.   LABORATORY DATA:  Hemoglobin 9.8, hematocrit 29.2%.  Blood gases showed pH  7.39, pCO2 44, pO2 49, bicarbonate 26.  O2 saturation was only 83%.  Potassium 3.1, glucose 160, BUN 10, creatinine 0.8.   ASSESSMENT:  1. Intertrochanteric fracture of the right hip.  2. Right comminuted humeral fracture.  3. Osteoporosis.  4. History of vertebral fractures, status post vertebroplasty.  5. Confusion which may be secondary to hypoxia.  6. Hypokalemia.  7. Hyperglycemia.  8. Essential hypertension.    PLAN:  Plan is to put her on O2, recheck blood gases, start her on  potassium  20 mEq t.i.d.  She will be on Protonix 40 mg daily and will have her on a  diabetic diet.  Recheck blood gases once she is on O2.  Once she is  medically stable, would definitely repair the hip.  As I have talked with  her husband, will need to use even stronger medicines to treat her  osteoporosis on discharge, which we will be discussing at that time.                                                Mila Homer. Sudie Bailey, M.D.    SDK/MEDQ  D:  10/25/2002  T:  10/25/2002  Job:  [id number]

## 2010-07-21 NOTE — Discharge Summary (Signed)
[name], [name]                ACCOUNT NO.:  [id number]   MEDICAL RECORD NO.:  [id number]          PATIENT TYPE:  INP   LOCATION:  A223                          FACILITY:  [facility]   PHYSICIAN:  Tesfaye D. [name], MD   DATE OF BIRTH:  Jun 18, 1925   DATE OF ADMISSION:  03/23/2005  DATE OF DISCHARGE:  02/05/2007LH                                 DISCHARGE SUMMARY   DISCHARGE DIAGNOSES:  1.  Fracture of the left hip and status post open reduction and internal      fixation.  2.  Fracture of the neck of left femur.  3.  Fracture of the left rib.  4.  Necrotic fracture of the distal radius.  5.  Syncopal episode.  6.  Seizure disorder.  7.  Hypertensive crisis.  8.  Diabetes mellitus.  9.  History of non-Q-wave myocardial infarction.  10. History of pulmonary edema.  11. Renal insufficiency.  12. History of aspiration pneumonia.  13. Anemia.   DISCHARGE MEDICATIONS:  1.  Accu-Cheks a.c. and nightly with sliding scale coverage.  2.  Reglan 10 mg p.o. a.c. and nightly.  3.  Hydralazine 100 mg p.o. t.i.d.  4.  Plavix 75 mg p.o. daily.  5.  Aspirin 81 mg p.o. daily.  6.  Metoprolol 100 mg p.o. b.i.d.  7.  Norvasc 5 mg p.o. daily.  8.  Clonidine 0.1 mg p.o. t.i.d.  9.  Keppra 250 mg p.o. nightly.  10. Calcium carbonate with vitamin D 500 mg 1 tablet p.o. b.i.d.  11. Calcitonin nasal spray 1 spray in alternating nostrils.  12. Multivitamin 1 tablet p.o. daily.  13. Altace 10 mg p.o. nightly.  14. Lovenox 30 mg subcu until patient ambulates.  15. Avandia 4 mg p.o. daily.  16. Lortab 5/500 one tablet p.o. q.6h. p.r.n. pain  17. Colace 100 mg p.o. b.i.d. p.r.n. for constipation.   DISPOSITION:  The patient will be discharged to nursing home for skilled  nursing care and physical therapy.   HOSPITAL COURSE:  This is a 77 year old female patient with history of  multiple medical issues who was brought to the emergency room due to the  loss of consciousness. The patient was found  unconscious, making some noise  by her husband. EMS was called, and she was brought to the emergency room.  On initial evaluation, the patient had a blood pressure of 260/110 with a  pulse of under 40. She was given IV labetalol, and her blood pressure was  controlled, and patient was admitted to ICU. Neurology was called for  evaluation. The possibility of seizure disorder was considered, and the  patient was empirically started on Keppra. Later evaluation showed that the  patient had multiple fractures including left hip, left radius and left  humerus. Orthopedics was called, and the patient underwent open reduction  internal fixation of the left hip. She has also a cast on her left forearm.  Her blood pressure is currently improved. The patient is going to be  transferred to nursing home for physical therapy and skilled nursing care.  Tesfaye D. [name], MD  Electronically Signed     TDF/MEDQ  D:  04/09/2005  T:  04/09/2005  Job:  [id number]

## 2010-07-21 NOTE — Group Therapy Note (Signed)
   [name], [name]                          ACCOUNT NO.:  [id number]   MEDICAL RECORD NO.:  [id number]                   PATIENT TYPE:  INP   LOCATION:  A212                                 FACILITY:  [facility]   PHYSICIAN:  Mila Homer. Sudie Bailey, M.D.           DATE OF BIRTH:  09-Jun-1925   DATE OF PROCEDURE:  07/23/2002  DATE OF DISCHARGE:                                   PROGRESS NOTE   SUBJECTIVE:  She is still having some pain but complains more of low back  pain.  She notes that when she was putting her flowers in she could not bend  over; she was hurting in the low back recently.   OBJECTIVE:  Today the temperature is 98.1, pulse is 68, respiratory rate 18,  blood pressure 187/75 (155/86 prior to this).  She is oriented and alert, no  acute distress at present, sitting up in bed, thin but well-developed.  The  heart has a regular rhythm, rate of 70.  The lungs are clear throughout,  moving air well.  The abdomen is soft without tenderness, without  hepatosplenomegaly or mass.  There is no tenderness over the spinous process  of thoracic spine but there is some tenderness in the L4-L5 spinous process  area.  There is no edema of the ankles.   ASSESSMENT:  Chronic upper abdominal pain, possibly related to vertebral  fracture of the thoracic spine.   PLAN:  Discussed a length yesterday with Dr. Karilyn Cota, whom I asked to see her  in consult.  MRI of the thoracic spine today.  If it is negative, I believe  the workup has been complete and will be able to discharge home tomorrow and  assume that some of the pain may be related to the L4-L5 vertebral  compression fracture.                                               Mila Homer. Sudie Bailey, M.D.    SDK/MEDQ  D:  07/23/2002  T:  07/23/2002  Job:  [id number]

## 2010-07-21 NOTE — Consult Note (Signed)
NAMEKADEN, DUNKEL NO.:  [id number]   MEDICAL RECORD NO.:  [id number]          PATIENT TYPE:  INP   LOCATION:  IC06                          FACILITY:  [facility]   PHYSICIAN:  Vida Roller, M.D.   DATE OF BIRTH:  1925-12-24   DATE OF CONSULTATION:  08/01/2004  DATE OF DISCHARGE:                                   CONSULTATION   CARDIOLOGIST:  Newcastle Bing, M.D.   HISTORY OF PRESENT ILLNESS:  Mrs. [name] is a 77 year old woman with a past  medical history significant for lung cancer status post resection in 2000  who presented to the emergency department in [facility] two days  prior to this with acute pulmonary edema in hypertensive crisis.  She  required intubation and was placed in the intensive care unit, and over the  course of her stay here in the intensive care unit has developed progressive  electrocardiographic changes manifesting with significant elevations in her  cardiac enzymes, and we were asked to consult on her care.  Of note is that  her chest x-ray is very concerning for recurrence of her lung cancer.  The  patient is intubated and sedated and unable to give a history.  Most of her  information has been gleaned from the medical record.   PAST MEDICAL HISTORY:  1. Paroxysmal atrial fibrillation.  2. Diabetes mellitus.  3. Anemia.  4. Osteoporosis.  5. Hypertension.  6. Lung cancer status post video assisted thoracostomy by Dr. Edwyna Shell in      2000.  7. Hyperlipidemia.  8. Renal artery stenosis.  9. AAA.  10.Hysterectomy and appendectomy and bilateral tubal ligation.  11.CT scan in September 2005 which showed no evidence of recurrence of the      lung cancer.     MEDICATIONS:  From what we can tell from her chart:  1. Minoxidil 10 mg t.i.d.  2. Avandia 4 mg once daily.  3. Lasix 40 mg b.i.d.  4. Clonidine 0.2 mg t.i.d.  5. Atenolol 100 mg once daily.     FAMILY HISTORY:  Not obtainable.   REVIEW OF SYSTEMS:  Not  obtainable.   SOCIAL HISTORY:  She is married with one child.  She has about a 136 years  pack year smoking history but quit about two years ago.   HOSPITAL MEDICATIONS:  1. Aspirin 325 mg once daily.  2. Zithromax 500 mg IV daily.  3. Rocephin 1 g IV daily.  4. Clonidine TTS patch q.7 days.  5. D-5.5 normal saline and 100 cc an hour.  6. Lovenox 50 mg q.12h.  7. Sliding scale insulin.  8. Lopressor 50 mg p.o. q.12h.  9. Potassium chloride 10 mEq to try to supplement her potassium.  10.Altace 2.5 mg once daily.  11.Nitroglycerin drip was stopped yesterday.   I am uncertain as to whether she is getting any of these p.o. medications as  she is intubated.   PHYSICAL EXAMINATION:  GENERAL APPEARANCE:  She is an ill-appearing African  American woman who is intubated and unable to give a history.  VITAL SIGNS:  Her temperature is 98.5, pulse 62, respirations 16, blood  pressure 136/52, progressing down to 97/41.  She weighed 103 pounds when she  was admitted.  She is now 108.5 pounds.  Her eyes and nose are positive,  about 3 m over the course of the last 24 hours.  HEENT:  Normocephalic, atraumatic and generally unremarkable with the  exception of the intubated status.  She has an NG tube in.  NECK:  Supple.  She has no jugular venous distention or carotid bruits that  I can appreciate.  CARDIOVASCULAR:  Mildly bradycardic and regular.  There is no obvious  murmur.  LUNGS:  She has course breath sounds throughout secondary to the ventilator,  but no obvious rales or rhonchi are noted.  SKIN:  Without significant changes.  GU/RECTAL/BREAST:  Deferred.  ABDOMEN:  Soft, nontender.  She does have an easily heard abdominal bruit.  EXTREMITIES:  She has 1+ pulses throughout.  There is no significant edema.  Her capillary refill appears normal.  NEUROLOGICAL:  She is sedated on a ventilator and is essentially  unresponsive.   STUDIES:  Chest x-ray, when she was admitted, showed diffuse  interstitial  pulmonary edema.  Subsequently, after intubation, she has a 3.1x2.6 cm mass  in the right middle and right lower lobe with cardiomegaly.  Chest x-ray  today shows improvement in her congestive heart failure.  Electrocardiogram  showed sinus rhythm at a rate of 70 with a normal axis, slightly prolonged  QRS duration of 100 milliseconds.  Her QT is markedly prolonged at 594  milliseconds.  She has ST segment depression with inverted T wave in the  anterior lateral leads V4-V6 and then inverted T waves in the inferior leads  as well which is new from her admission electrocardiogram.   LABORATORY DATA:  White blood cell count 14.6, H&H 11 and 33, platelets  182,000.  Sodium 138, potassium 2.8, chloride 101, bicarbonate 23, BUN 14,  creatinine 2.2, blood sugars 166.  Her first two sets of cardiac enzymes  show CK of 587 with an MB fraction of 21.7, troponin 11.06.  Second set CK  of 985, MB 31.4, troponin 16.3.  Total protein 8, albumin 4.3, INR 1.1, PT  14, PTT has not been done.  Urinalysis shows white blood cells, red blood  cells and some bacteria.  Most recent blood gas shows a pH of 7.56, PCO2 26,  PO2 79 with a 97% saturation.   IMPRESSION:  This is a very difficult situation.  This is a patient who  appears to have an evolving anterolateral wall myocardial infarction with  progressive hypotension who also appears to have recurrent chest mass on  chest x-ray which is very worrisome for recurrent cancer.  In addition, she  has severe peripheral vascular disease and is becoming progressively  hypotensive.  Her primary doctor appears to want her to be sent to [facility].  I have spent quite a bit of time talking with her husband  and informing him of the significance of these particular diagnoses and  getting his sense of where he wants [place] to proceed with this in an elderly woman who appears to have recurrent lung cancer and severe coronary artery  disease.  He  wants to be aggressive, specifically without a clear tissue  diagnosis for the lung mass.  As such, we are going to transfer her to [facility] emergently and put her on IV heparin.  We are going to  stop  all of her antihypertensives in hopes of stabilizing her blood pressure and  get her primary cardiologist involved in decision making process regarding  this patient's care.  I think her short term and long term mortality is  substantial here, and that the likelihood that she is going to do well with  this degree of EKG change is pretty poor.  Having said that, we will pursue  this course of action.      JH/MEDQ  D:  08/01/2004  T:  08/01/2004  Job:  [id number]

## 2010-07-21 NOTE — Discharge Summary (Signed)
NAMEJAMYRIA, [name]                ACCOUNT NO.:  [id number]   MEDICAL RECORD NO.:  [id number]          PATIENT TYPE:  INP   LOCATION:  A228                          FACILITY:  [facility]   PHYSICIAN:  Tesfaye D. [name], MD   DATE OF BIRTH:  [birth year]   DATE OF ADMISSION:  04/13/2005  DATE OF DISCHARGE:  02/13/2007LH                                 DISCHARGE SUMMARY   DISCHARGE DIAGNOSES:  1.  Pneumonia.  2.  Congestive heart failure.  3.  Hypertension.  4.  History of multiple fractures.  5.  Syncopal episode.  6.  Seizure disorder.  7.  Coronary artery disease.  8.  History of MI.  9.  History of pulmonary edema.  10. Anemia.  11. History of renal insufficiency.  12. Diabetes mellitus.   DISCHARGE MEDICATIONS:  1.  Accu-Chek with sliding scale insulin coverage.  2.  Reglan 10 mg p.o. a.c. and q.h.s.  3.  Hydralazine 100 mg p.o. t.i.d.  4.  Plavix 75 mg p.o. daily.  5.  Aspirin 81 mg p.o. daily.  6.  Metoprolol 100 mg p.o. b.i.d.  7.  Norvasc 5 mg p.o. daily.  8.  Clonidine 0.1 mg p.o. daily.  9.  Keppra 250 mg p.o. q.h.s.  10. Calcium with vitamin D one tablet p.o. b.i.d.  11. Calcitonin nasal spray, one spray in each nostril.  12. Multivitamin one p.o. daily.  13. Altace 10 mg p.o. q.h.s.  14. Lovenox 30 mg subcu daily until the patient is full ambulated.  15. Avandia 4 mg p.o. daily.  16. Folate 100 mg p.o. daily.  17. Lortab 5/500 one tablet p.o. q.6 h p.r.n.  18. DuoNeb q.4 h p.r.n. shortness of breath.  19. Lasix 40 mg p.o. daily.  20. K-Ciel 20 mEq p.o. daily.  21. Levaquin 500 mg p.o. daily for five more days.   DISPOSITION:  The patient will be discharged back to [facility].   HOSPITAL COURSE:  This is a 77 year old female patient with history of  multiple medical problems including recent syncopal episode and accident on  fall.  The patient had multiple bone fractures.  She was recently discharged  to [facility] for rehab and skilled nursing care.  However,  when in [facility], she developed shortness of breath and was brought to the emergency  room.  Her chest x-ray showed increased edema and interstitial opacity.  The  patient was admitted as a possible case of pneumonia with worsening of  congestive heart failure.  The patient received IV antibiotics and IV Lasix.  Over hospital stay, the patient has improved.  She is back to her baseline.  The patient is going to be discharged back to [facility] on oral  antibiotics.      Tesfaye D. [name], MD  Electronically Signed     TDF/MEDQ  D:  04/17/2005  T:  04/17/2005  Job:  [id number]

## 2010-07-21 NOTE — Consult Note (Signed)
NAME:  [name], [name]                          ACCOUNT NO.:  [id number]   MEDICAL RECORD NO.:  [id number]                   PATIENT TYPE:  INP   LOCATION:  A331                                 FACILITY:  [facility]   PHYSICIAN:  Vida Roller, M.D.                DATE OF BIRTH:  [birth year]   DATE OF CONSULTATION:  06/04/2002  DATE OF DISCHARGE:                                   CONSULTATION   REFERRING PHYSICIAN:  Mila Homer. Sudie Bailey, M.D.   REASON FOR CONSULTATION:  This is a 77 year old woman with an extensive past  medical history for peripheral vascular disease, hypertension and  hyperlipidemia who was admitted with abdominal pain.  We are asked to  evaluate for the possibility of cardiac ischemia or a silent MI.  She denies  any discomfort in her chest, any shortness of breath, any difficulty with  sleeping flat on her back.  There is no PND or orthopnea and no evidence of  lower extremity edema consistent with congestive heart failure.   PAST MEDICAL HISTORY:  Her past medical history is significant for  hypertension.  She is on 5 medications for this and has significant end  organ damage with severe peripheral vascular disease.  She has a 3.6 cm  infrarenal AAA.  She has a history of a meningioma in her brain; and also  has had cerebrovascular disease documented with a CVA.  She does have  diabetes.  She also has a history of lung cancer status post a VATS  procedure, thought to be cured of, and peripheral neuropathy as well as  general anxiety.  She lives in [place] with her family.  She is a  previous tobacco abuser, 36 pack years, and quit in 1996 after her lung  cancer.  She does not drink.  She does not use drugs.   FAMILY HISTORY:  She is unable to give a family history, at this point, due  to mild dementia.   REVIEW OF SYSTEMS:  Review of systems is otherwise noncontributory aside  from significant abdominal pain.  She states that the pain in her abdomen is  circumferential with radiation to bilateral flanks associated with  insignificant movement.  She has had associated weakness, a bit of  confusion, and some hypotension as well as dizziness and vomiting with the  abdominal pain.  She has been evaluated with an abdominal CT which shows a  stable aneurysm, no retroperitoneal bleed, but new lesions in her liver  which are of unknown etiology, suspicious for carcinoma.   PHYSICAL EXAMINATION:  VITAL SIGNS:  On physical exam she is mildly febrile  at 100.3.  Heart rate is 112 in sinus.  Respiratory rate is 20; and her  blood pressure is 132/67.  GENERAL:  In general she is a thin, cachectic-appearing, female in no  apparent distress.  MENTAL STATUS:  She is alert and oriented x2, but  uncertain about the dates;  that she was admitted on Monday of last week, when actually she was admitted  this week.  HEENT:  Her head is without significant trauma.  There are equal, round, and  reactive pupils. Her dentition is absent.  NECK:  The neck is supple with no jugular venous distention.  She has  bilateral carotid bruits.  There is no lymphadenopathy seen.  CHEST:  His chest is clear to auscultation.  HEART:  Reveals a hyperdynamic precordium with normal first and second heart  sounds and an easily heard fourth heart sound.  There is a 2/6 murmur which  is diamond shaped heard at the right sternal border which does not radiate.  No diastolic murmurs were noted.  ABDOMEN:  Her abdomen is soft, mildly nontender sort of in a diffuse  pattern.  There are active bowel sounds; and a very loud abdominal bruit  heard in the midline.  GENITOURINARY AND RECTAL:  Exams were deferred.  EXTREMITIES:  Without clubbing, cyanosis, or edema, but she has loud  bilateral femoral bruits.  NEUROLOGIC:  Her neurologic exam is nonfocal.   X-RAY AND LABORATORY DATA:  She has not had a chest x-ray.  CT of her  abdomen is as previously described.  Her electrocardiogram  shows sinus  tachycardia at a rate of 101 with normal intervals and nor axes.  She does  have a QT, corrected for 503 milliseconds which is likely secondary to  several of her medications, but otherwise is unremarkable.  There are no ST-  T wave changes and no Q waves consistent with coronary artery disease.  Laboratories:  Her white blood cell count of 7.8 with an H&H of 10 and 31;  platelet count 200,000.  She has a sodium of 136, potassium of 3.5, chloride  of 100, bicarb of 28, BUN and creatinine are 28 and 1; and her blood sugar  is 117.  Her first set of cardiac enzymes are inconsistent with acute  myocardial infarction.   ASSESSMENT:  1. Atypical presentation for coronary disease.  I doubt she has significant     hemodynamically obstructive disease as she is really asymptomatic and has     no EKG changes consistent with that.  2. Significant peripheral vascular disease. With this abdominal pain and     known abdominal aortic aneurysm and a loud bruit I am very concerned for     the abdominal pain being vascular in etiology whether it be mesenteric     ischemia or a worsening progressive change in her abdominal aortic     aneurysm with impending rupture.  I suspect that this is something that     needs to be further evaluated.  At this point, we will proceed with an     evaluation for coronary artery disease with an echocardiogram today and     an adenosine Cardiolite.  However, I think, that aggressive evaluation of     her abdominal pain with an abdominal aortogram would be a very reasonable     consideration.                                                Vida Roller, M.D.    JH/MEDQ  D:  06/04/2002  T:  06/05/2002  Job:  [id number]

## 2010-07-21 NOTE — Group Therapy Note (Signed)
[name], [name]                ACCOUNT NO.:  [id number]   MEDICAL RECORD NO.:  [id number]          PATIENT TYPE:  ORB   LOCATION:  S105                          FACILITY:  [facility]   PHYSICIAN:  Hanley Hays. Dechurch, M.D.DATE OF BIRTH:  [birth year]   DATE OF PROCEDURE:  DATE OF DISCHARGE:                                   PROGRESS NOTE   The patient is a 77 year old African American female who is a resident of  the [facility], previously followed by Dr. [name].  I am assuming her  comprehensive care.  She was transferred to the [facility] after sustaining  a left hip fracture.   PAST MEDICAL HISTORY:  1.  Since that time, she has also had a distal left clavicle fracture,  2.  A left intertrochanteric fracture at the site of the nail.  3.  She had a proximal left humerus fracture at the time of her last hip      fracture.  4.  Previously, a history of a right hip fracture as well.  5.  She has severe osteoporosis.  6.  She has known coronary artery disease which is being managed medically.  7.  A history of peripheral vascular disease.  8.  AAA - 4 x 4 as of July 2006.  9.  Chronic renal insufficiency.  10. History of left internal carotid stenosis thought to be non-obstructive      with history of stroke.  11. A non-ST MI complicated by ventilator-dependent respiratory failure in      May 2006.  12. Apparently, she has a well preserved LV function.  13. History of status post video-assisted thoracoscopy with resection in      2000.  14. History of hysterectomy.  15. Tubal ligation.  16. Appendectomy.  17. Hypertension.  18. Hyperlipidemia.  19. Diabetes mellitus with a recent hemoglobin A1c of 8.8.  20. The patient also has dementia, oriented to self only but has remained      quite pleasant.  21. Periventricular white matter disease and microvascular ischemia of the      brain.  22. Posterior fossa meningioma, measuring 20 x 15-mm with some mass effect      in the left  transverse sinus in the region of the sigmoid sinus per MRI      in January 2007.  23. Possible seizure disorder on very low dose Keppra without recurrence.   SOCIAL HISTORY:  Pertinent.  She is married, apparently supportive family.  Apparently, very substantial tobacco history, quitting several years prior.  The notes reveal 135-pack years.  No alcohol abuse.   FAMILY HISTORY:  Noncontributory.   MEDICATION REGIMEN:  1.  Apresoline 400 mg t.i.d.  2.  Catapres 0.3 mg t.i.d.  3.  Altace 10 at h.s.  4.  Calcitonin daily.  5.  Keppra 250 daily.  6.  Multivitamin with mineral daily.  7.  Actos 45 daily.  8.  Glucotrol XL 5 b.i.d.  9.  Lasix 40 daily.  10. Micro-K 20 daily.  11. Aspirin 81 daily.  12. Amlodipine 10 daily.  13. Lopressor 100 b.i.d.  14. Os-Cal D b.i.d.   ALLERGIES:  PENICILLIN.   PHYSICAL EXAMINATION:  GENERAL:  Reveals a pleasant, confused, elderly  female, no distress, sitting in her chair.  She moves her extremities x4.  Verbal is somewhat limited but she can answer some simple questions, thought  not consistently.  VITAL SIGNS:  Blood pressure today is not available.  Review of her  pressures from the last month and over the last several months reveals some  variability.  For the most part it is well controlled.  She has occasional  pressures in the 90s.  Her last one June 3rd was 90/60.  Pulse was about 74  and regular.  Respirations are unlabored.  NECK:  Revealed bilateral soft carotid bruits.  No JVD.  Breath sounds are  diminished but no rales or rhonchi.  HEART:  Regular with a 2/6 systolic murmur at the left sternal border.  No  gallop is present.  SKIN:  Without rash or breakdown, somewhat dry.  ABDOMEN:  Soft with normal bowel sounds and nontender.  Full assessment is  not performed as she is in a chair.   ASSESSMENT:  1.  Severe osteoporosis with multiple fractures.  2.  History of multiple falls, question initially related to a seizure.  3.   Dementia, progressive.  4.  Hypertension.  5.  Known coronary artery disease.  6.  Chronic obstructive pulmonary disease with history of tobacco abuse.  7.  History of lung carcinoma without evidence of obvious recurrence.  8.  Peripheral vascular disease with abdominal aortic aneurysm, last      evaluated in 2006.  9.  Diabetes mellitus, not well controlled with recent adjustments in her      medications.  10. Chronic renal insufficiency.  Last BUN 2 weeks ago was at 20 with a      creatinine of 1.   PLAN:  1.  This is a very complicated elderly female who clearly is very disabled      secondary to progressive vascular dementia and her osteoporosis.  Her      safety awareness is limited as well.  There have been no real behavior      issues according to the staff.  Since her admission, she has had about a      12-pound weight loss, though she has been relatively stable over the      last month.  She was recently placed on Megace.  At this point, our goal      should be to optimize her diabetes management.  2.  She is on multiple medications that may be diminishing her appetite and      on a fairly complicated regimen.  At this point, we will assess her      diabetes management, follow up her BNP.  She is on Altace.  It may be      reasonable to consolidate some of these medications.  We will check      orthostatic blood pressures.  I am going to decrease her Lasix as there      is no evidence of volume overload.  We will change her calcium D to      calcium carbonate plus vitamin D at a more therapeutic dose and monitor.  3.  She has an advanced directive of full code.  No family is present.  And,      the patient's dementia limits her ability to comprehend the issues at  hand; therefore, we will discuss this further with the family.  We      should continue aggressive supportive care but her prognosis is poor      given her multiple co-morbidities. 4.  She also has significant  dementia and may benefit from the addition of a      pseudocholinesterase inhibitor as far as maintaining her functional      status.  Again, there are multiple issues in this elderly lady and I am      not going to make any other changes today.      Hanley Hays Josefine Class, M.D.  Electronically Signed     FED/MEDQ  D:  08/24/2005  T:  08/24/2005  Job:  [id number]

## 2010-07-21 NOTE — Consult Note (Signed)
[name], [name]                ACCOUNT NO.:  [id number]   MEDICAL RECORD NO.:  [id number]          PATIENT TYPE:  INP   LOCATION:  IC06                          FACILITY:  [facility]   PHYSICIAN:  Vida Roller, M.D.   DATE OF BIRTH:  March 16, 1925   DATE OF CONSULTATION:  03/26/2005  DATE OF DISCHARGE:                                   CONSULTATION   REFERRING PHYSICIAN:  Tesfaye D. [name], M.D.  Cardiologist, Dr. [place]ola Bing.   HISTORY OF PRESENT ILLNESS:  [name] is a lady who was admitted on  March 23, 2005, after a syncope episode with a fall.  She appears to have  a broken left hip with significant trauma.  She is unable to give adequate  history.  Most of the history is from the chart.  It appears that she has  had an acute confusional state and has been unable to manage her own  affairs.  She came in markedly hypertensive to the ER.  She was admitted to  the intensive care unit for evaluation.  We did an echocardiogram on her on  Friday, which shows normal LV systolic function and we were asked to comment  about her episode of syncope and her mildly abnormal cardiac enzymes.  She  is having no discomfort in her chest currently.  She complains, however, of  significant left leg pain with no shortness of breath.   PAST MEDICAL HISTORY:  1.  Coronary disease.  2.  Severely calcified coronaries with an occluded right coronary artery      with substantial collaterals.  Her LV systolic function was preserved at      that time and continues to be.  3.  History of paroxysmal atrial fibrillation.  4.  Hypertension.  5.  Hyperlipidemia.  6.  Diabetes.  7.  History of peripheral vascular disease with an abdominal aortic aneurysm      4.3 x 4.0 cm when evaluated by CT in July 2006.  8.  Chronic renal insufficiency.  9.  Stroke with left internal carotid stenosis which is thought to be      nonobstructive.  10. She was admitted and intubated back in May 2006, after a non-ST  segment      elevation myocardial infarction which was complicated by a ventilator-      dependent respiratory failure and that is when the catheterization was      done.  11. 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  12. Lung cancer in the past, status post video-assisted thoracoscopy with      resection back in 2000, thought to be for cure.  13. History of a hysterectomy and bilateral tubal ligation.  14. Appendectomy.   SOCIAL HISTORY:  I could not get a social history, but she has a relatively  impressive smoking history thought to be about 135 pack years.  She quit  about 2 years ago.   FAMILY HISTORY:  Not available due to her mild to moderate confusion.   REVIEW OF SYSTEMS:  Not  available due to her mild to moderate confusion.   MEDICATIONS:  1.  Norvasc 5 mg once a day.  2.  Aspirin 81 mg a day.  3.  Catapres 0.1 mg three times a day.  4.  Plavix 75 mg a day.  5.  Lovenox 40 mg subcu nightly.  6.  Lasix 40 mg b.i.d.  7.  Hydralazine 100 mg three times a day.  8.  Imdur 120 mg once a day.  9.  Reglan 10 mg a.c. and h.s.  10. Lopressor 10 mg twice a day.  11. Potassium.  12. Altace 10 mg once a day.   PHYSICAL EXAMINATION:  GENERAL:  She is an elderly, African-American female  who is moderate distress secondary to her leg pain.  VITAL SIGNS:  Pulse 78, respirations 18, blood pressure 172/58.  HEENT:  Mild temporal wasting, but otherwise unremarkable.  NECK:  Supple.  There are bilateral soft carotid bruits, but no jugular  venous distention.  CHEST:  Decreased breath sounds throughout her bases with no obvious rales.  CARDIAC:  Regular.  There is a soft, 2/6 systolic murmur with S1, S2 normal.  Her point of maximal impulse was not palpable.  SKIN:  Without significant rashes.  GENITALIA:  Deferred.  RECTAL:  Deferred.  BREASTS:  Deferred.  ABDOMEN:  Soft.  Normoactive bowel sounds.  EXTREMITIES:  Her left leg appears to  have a significant deformity with the  toe pointing significantly outward.  I did not examine further because of  the significant pain.  There is no edema noted.  Her pulses are 1+.  NEUROLOGIC:  Markedly abnormal with mental status that is alert and oriented  only x1.  She does not have any obvious focal findings.   LABORATORY DATA AND X-RAY FINDINGS:  She had a head CT which shows a left  posterior fossa meningioma which is larger than that found in August 2004.  She has atrophy and small vessel disease.  This was confirmed with MR of the  brain.  Chest x-ray shows low lung volumes with probably poor inspiration.  She does have some early aspiration changes.  She had an abdominal series  which showed a benign gas pattern.  Her electrocardiogram shows sinus rhythm  at a rate of 76 with left ventricular hypertrophy and repolarization changes  consistent with that.  Her intervals are all normal including her QT which  is 465 corrected.   White blood cell count 8.3, H&H 10 and 30, platelet count 115.  Sodium 134,  potassium 2.8, chloride 96, bicarb 29, BUN 10, creatinine 0.8, blood sugar  101.  Her LFTs all within normal limits.  She has three sets of cardiac  enzymes which showed markedly elevated CKs with normal MBs and troponins  which are just very slightly elevated consistent probably with a  musculoskeletal pattern as opposed to an acute myocardial pattern.  Her B-  type natriuretic peptide is 394.  Her prolactin level was markedly elevated  at 274.   ASSESSMENT/RECOMMENDATIONS:  1.  Coronary disease.  We have been managing her medically.  Her left      ventricular systolic function is fine and she has manifested no chest      discomfort.  Her enzymes are not consistent with acute myocardial      infarction and her electrocardiogram is not ischemic, although partly      abnormal with the left ventricular hypertrophy. 2.  Syncope.  This appears to be secondary to a  seizure with this  meningioma      which is getting larger on her head computed tomography.  The elevated      prolactin level and the history of being found down with seizure like      activity occurring.  3.  Peripheral vascular disease which is severe.  It is probably not      amenable to treatment given her other multiple medical problems.  4.  Hypertension.  This is also severe.  This could be secondary to medical      noncompliance as she is on multiple medications that could rebound on      her.  She will need to have the aggressively treated and it appears that      she is on multiple agents.  I would follow her BUN and creatinine quite      avidly as there is a chance this may become abnormal, although it is      noted that she has patent renal arteries on a computed tomography scan.      We will follow her with you.      Vida Roller, M.D.  Electronically Signed     JH/MEDQ  D:  03/26/2005  T:  03/26/2005  Job:  [id number]

## 2010-07-21 NOTE — Group Therapy Note (Signed)
   NAMEHESSIE, [name]                          ACCOUNT NO.:  [id number]   MEDICAL RECORD NO.:  [id number]                   PATIENT TYPE:  INP   LOCATION:  A212                                 FACILITY:  [facility]   PHYSICIAN:  Mila Homer. Sudie Bailey, M.D.           DATE OF BIRTH:  [birth year]   DATE OF PROCEDURE:  07/17/2002  DATE OF DISCHARGE:                                   PROGRESS NOTE   SUBJECTIVE:  She is feeling somewhat better.  Since her upper abdominal pain  is better she can move some without hurting.   OBJECTIVE:  Temperature 100.3, pulse 76, respiratory rate 16, blood pressure  188/67.  She is supine in bed, no acute distress.  She is thin but well-  developed.  Lungs are clear throughout.  The heart has a regular rhythm,  rate of about 70.  The abdomen is soft without hepatosplenomegaly or mass.  Deep palpation of the upper abdomen does not create pain - either right  upper quadrant or left upper quadrant.  There is no edema of the ankles.   Review of the notes from cardiology, who find that she has nonsustained SVT,  hypertension, diabetes mellitus, and hypercholesterolemia.  Note from Dr.  Franky Macho, general surgery, who doubts she has an intraabdominal process.   ASSESSMENT AND PLAN:  1. Upper abdominal pain, question etiology.  Ultrasound of the liver     pending.  CT of the chest with and without contrast pending.  2. Essential hypertension.  Blood pressures have been up since being in the     hospital and now she is on atenolol 50 mg daily with Catapres 0.2 mg     b.i.d. and Lotrel 5/10 daily.  Diastolic is fine but systolic is still     elevated as it has been in the past, probably related to her right renal     artery stenosis.  3. Abdominal aortic aneurysm, which is stable.  4. Lung cancer, status post video-assisted thoracoscopic surgery, now four     years ago in 2000.  5. Cardiomegaly.                                               Mila Homer. Sudie Bailey,  M.D.    SDK/MEDQ  D:  07/17/2002  T:  07/17/2002  Job:  [id number]

## 2010-07-21 NOTE — Discharge Summary (Signed)
NAME:  [name], [name]                          ACCOUNT NO.:  [id number]   MEDICAL RECORD NO.:  [id number]                   PATIENT TYPE:  IPS   LOCATION:  4140                                 FACILITY:  [facility]   PHYSICIAN:  Erick Colace, M.D.           DATE OF BIRTH:  1925/08/17   DATE OF ADMISSION:  11/02/2002  DATE OF DISCHARGE:                                 DISCHARGE SUMMARY   DATE OF DISCHARGE:  Anticipated date of discharge November 19, 2002.   DISCHARGE DIAGNOSES:  1. Right hip open reduction, internal fixation for fracture.  2. Proximal comminuted humeral fracture.  3. Ileus, resolved.  4. Hypertension.  5. Diabetes mellitus.  6. Chronic anorexia.  7. Fevers,  resolved.   HISTORY OF PRESENT ILLNESS:  The patient is a 77 year old female with  history of congestive heart failure, recent vertebral fracture, status post  vertebroplasty who fell on the way to church on October 25, 2002, sustained a  right hip intertrochanteric fracture and right proximal comminuted humerus  fracture.  She was evaluated by Dr. Hilda Lias and underwent open reduction,  internal fixation right hip on October 27, 2002.  Postoperative was touch-  down weightbearing on right leg and right humeral fracture being treated  with sling.  She did have some problems with hypoxia at admission.  A chest  CT scan done showed no pulmonary embolus, moderate to severe chronic  obstructive pulmonary disease, no recurrent neoplasm.  CT scan of bilateral  lower extremity showed no deep venous thrombosis.  The patient has had some  problems with postoperative confusion, has also required transfusion of  blood cells secondary to drop in her H and H.  Physical therapy initiated  and patient is minimal to moderate assist for transfers, minimal assist for  activities of daily living.   PAST MEDICAL HISTORY:  1. Osteoporosis.  2. DM type 2.  3. __________.  4. Heart murmur.  5. TIAs.  6. Hysterectomy.  7.  Lung biopsy.  8. Vertebroplasty August 2004.  9. Benign breast biopsy.  10.      Constipation.  11.      Congestive heart failure.  12.      Anorexia.  13.      Chronic insomnia.   ALLERGIES:  PENICILLIN.   SOCIAL HISTORY:  The patient is married, lives in one level home with one  step at entry.  She was independent prior to admission.  She does not use  any tobacco or alcohol.   HOSPITAL COURSE:  The patient was admitted to rehabilitation on November 02, 2002, for inpatient therapies to consist of physical therapy and  occupational therapy daily.  Past admission, she was maintained on SUBQ  Arixtra for DVT prophylaxis.  Her diabetes was monitored with AC/HS CBG  checks.  Labs done past admission showed hemoglobin 11.5, hematocrit 33.3,  white blood cell count 7.4, platelet count 282,000.  Sodium 131, potassium  4.2, chloride 99, cO2 27, BUN 9, creatinine 0.7, glucose 97.   The patient was initially noted to have low grade fevers.  She also was  noted to have elevated blood pressures and her Catapres was increased.  She  was given Catapres TTS 3 patch for more consistent blood pressure control as  well as low dose ACE inhibitor was added. Her p.o. intake had been poor  throughout her stay.  The patient and husband report history of anorexia and  patient taking some type of appetite stimulant at home.  She does also  report drinking Ensure supplements at home. Ensure supplement was used  during her stay.  Of note on November 13, 2002, the patient reported  problems with nausea for a 24 hour duration and a KUB was done showing mild  colonic ileus.  She was started on Reglan and placed on full liquid diet for  48 to 72 hours.  Her gastrointestinal symptoms did improve, however, the  patient was noted to have some confusion on Reglan and therefore this was  discontinued.  Currently the patient is able to tolerate a regular diet  without difficulty and no signs of recurrent ileus.    The patient has had intermittent nausea and on KUB that was done there was a  question of gallstone.  Abdominal ultrasound was done and this showed a few  hepatic hemangiomas with small abdominal aortic aneurysm.  Labs done for  followup on liver function tests showed albumin at 3.5, SGOT 24, SGPT 16,  total bilirubin 0.9, alkaline phosphatase 270, amylase was at 102, lipase  was slightly elevated at 91.  Last check of CBC from November 16, 2002,  showed hemoglobin 11.4, hematocrit 34.3, white blood cell count 7.7,  platelet count 328,000.  Last check of lytes on November 13, 2002, showed  patient's hyponatremia to be stable with sodium 132, renal status also  stable with BUN 6, creatinine 0.7.  The patient's hip incision has healed  well without any signs or symptoms of infection.  No drainage and no  erythema noted.  She does continue with use of sling for her right upper  extremity.  Patient's mobility extremely limited in part secondary to  patient maintaining touch-down weight-bearing status on the right lower  extremity and problems using the sling.  Also, the patient is unable to  tolerate any narcotics including Ultram as she was noted to have some  confusion with use of these medications.  Physical therapy and occupational  therapy was initiated and the patient is currently requiring assistance for  activities of daily living.  Occupational therapy has been ongoing with  flexion, range of motion of right upper extremity.  Physical therapy is  limited secondary to patient requiring constant cues, touch-down weight-  bearing status.  She is currently +2 moderate assist to transfer.  She is  able to maintain touch-down weight-bearing status 75% of the time with  verbal cues.  Endurance does continue to be an issue.  She is modified  independent for navigation in her wheelchair on the unit.  Ambulation same until weight-bearing is advanced.  Secondary to the patient's poor progress   a search for a nursing home was initiated and bed is available at [facility] in  [place].  The patient is to be discharged to this facility on November 19, 2002, with progressive physical therapy and occupational therapy to  continue past discharge.   DISCHARGE MEDICATIONS:  1. K-Dur 20  mEq one b.i.d.  2. Avandia 4 mg per day.  3. Milk of Magnesia 30 mL every other night.  4. Os-Cal plus D one p.o. t.i.d.  5. Vitamin A and D ointment to sacrum b.i.d.  6. Atenolol 100 mg per day.  7. Minoxidil 10 mg t.i.d.  8. Altace 5 mg b.i.d.  9. Lasix 40 mg daily.  10.      Catapres TTS 3 patch change weekly on Wednesday.  11.      Tylenol 650 mg p.o. q.i.d.  12.      Dulcolax suppository 10 mg q.a.m. PRN.  13.      Megace 400 mg p.o. b.i.d.  14.      __________calorie of liquid supplement b.i.d.  15.      Ensure chocolate q.i.d.   ACTIVITY:  Touch-down weightbearing right lower extremity, nonweightbearing  right upper extremity with range of motion right upper extremity as  tolerated.   DIET:  Diabetic restriction, diet as tolerated.   SPECIAL DISCHARGE INSTRUCTIONS:  Progressive physical therapy and  occupational therapy to continue past discharge.  Continue monitoring CBGs  with AC/HS CBG checks.  Continue to encourage p.o. intake.   FOLLOWUP:  Patient is to follow up with LMD at the nursing home in one to  two weeks.  Patient is to follow up with Dr. Hilda Lias on November 30, 2002,  at 10:15 A.M.  Follow up with Dr. Claudette Laws as  needed.      Greg Cutter, P.A.                    Erick Colace, M.D.    PP/MEDQ  D:  11/18/2002  T:  11/18/2002  Job:  [id number]   cc:   Mila Homer. Sudie Bailey, M.D.  [address] [place], [place] [address]  Fax: [phone]   J. Darreld Mclean, M.D.  [phone]  [place] [address]  Fax: [phone]

## 2010-07-21 NOTE — Group Therapy Note (Signed)
   NAMEBAYLYNN, [name]                          ACCOUNT NO.:  [id number]   MEDICAL RECORD NO.:  [id number]                   PATIENT TYPE:  INP   LOCATION:  A329                                 FACILITY:  [facility]   PHYSICIAN:  Mila Homer. Sudie Bailey, M.D.           DATE OF BIRTH:  [birth year]   DATE OF PROCEDURE:  DATE OF DISCHARGE:                                   PROGRESS NOTE   SUBJECTIVE:  The patient is feeling a lot better.  She did have her surgery  a couple of days ago.   OBJECTIVE:  VITAL SIGNS:  Temperature 98.6, pulse 86, respiratory rate 18,  blood pressure 132/58, O2 sat is about 99% on 2 liters.  GENERAL:  She appears to be oriented and alert, tonight.  MUSCULOSKELETAL:  In no acute distress except for some pain in the right  shoulder where she fractured the humerus and in the right hip where she  fractured the femur.  LUNGS:  Clear throughout, tonight.  HEART:  Regular rhythm.  Rate of  about 70.  ABDOMEN:  Soft without hepatosplenomegaly or mass.  EXTREMITIES:  She does have mild bilateral pedal edema.   H&H today was 9.8 and 21.2.  Glucose 122.   ASSESSMENT:  1. Status post right humeral fracture.  2. Status post right femoral fracture secondary to a fall.  3. Status post vertebral fractures and vertebroplasty done in [place]     several weeks ago.  4. Osteoporosis which is generalized.  5. Essential hypertension, stable on multiple medicines.  6. Anemia secondary to her hip fracture.   PLAN:  H&H is due in the a.m. __________ hypertensives.  Followup with Dr.  Hilda Lias, her orthopedist.                                               Mila Homer. Sudie Bailey, M.D.    SDK/MEDQ  D:  10/30/2002  T:  10/30/2002  Job:  [id number]

## 2010-07-21 NOTE — Group Therapy Note (Signed)
NAMELUCIA, [name]                ACCOUNT NO.:  [id number]   MEDICAL RECORD NO.:  [id number]           PATIENT TYPE:  ORB   LOCATION:  S105                          FACILITY:  [facility]   PHYSICIAN:  Kerri Hays. Dechurch, M.D.DATE OF BIRTH:  Jul 28, 1925   DATE OF PROCEDURE:  11/28/2005  DATE OF DISCHARGE:                                   PROGRESS NOTE   [name] has overall remained stable.  She is still having some low  sugars occasionally in the afternoons.  She is alert and pleasant, denies  any complaints.  She has some confusion but that is her baseline.  She has  had no changes in her mental status.  Her blood pressures: Occasional  systolic in the 90s but no symptomatology of orthostasis.  She remains on a  complex medical regimen including aspirin 81 daily, multivitamin-  multimineral daily, Norvasc 10 daily, Lasix 20 daily, Vitamin D daily,  Lopressor 100 mg b.i.d., calcium 500 mg b.i.d., Alprazolam 100 mg t.i.d.,  Catapres 0.3 t.i.d., Altace 10 mg at bedtime, Mycelian daily, Keppra 250 at  bedtime, Glargine 12 units q.a.m. and Glucotrol XL 5 mg q.a.m., Lortab 5/500  q.a.m. pain.   PHYSICAL EXAMINATION:  She is alert and pleasant, no acute distress, no  edema is present.  Lungs clear, heart regular, abdomen soft.   ASSESSMENT AND PLAN:  1. Diabetes mellitus with hypoglycemia.  I am going to go ahead and      discontinued her Glucotrol XL and monitor.  She is due for a hemoglobin      A1c.  2. Hypertension.  She is on a very complicated regimen.  It would be      reasonable to start backing down some of these medications and      following her blood pressures.  She has had no problems with      orthostasis symptomatically.  However she is essentially non-ambulatory      so its to difficult to fully know.  3. Coronary artery disease being managed medically.  She is on beta      blockers and aspirin, with well-preserved LV function.  No evidence of      heart failure.  I am  going to discontinue her Lasix and potassium,      monitor her BMP, decrease her Catapres to 0.2 mg b.i.d.  4. Severe history of osteoporosis, remains on Vitamin D, calcium, and      calcitonin.  She has had a recent left clavicle fracture as well      intertrochanteric fracture and a left humerus fracture.  5. Dementia.  She is not on an anti-cholinesterase inhibitor and it may be      an option, though there has been no change in her functional status.  6. Chronic renal insufficiency.  Follow BMP.  7. History of anemia.  We will follow her hemoglobin as well.  She may      need iron studies, etc.  She is far enough out from her hospital stay      that it should not be  compromised.  8. Advanced directive.  There is no evidence of an advanced directive in      the chart.  Given the patient's dementia,      this needs to be reviewed with her family who were not present this      time and as I will not be managing her primary care, this will need to      be further discussed with her family.  My previous note had stated that      she was a full code.      [name], M.D.  Electronically Signed     FED/MEDQ  D:  11/28/2005  T:  11/30/2005  Job:  [id number]

## 2010-07-21 NOTE — H&P (Signed)
[name], [name]                ACCOUNT NO.:  [id number]   MEDICAL RECORD NO.:  [id number]          PATIENT TYPE:  INP   LOCATION:  A228                          FACILITY:  [facility]   PHYSICIAN:  Tesfaye D. [name], MD   DATE OF BIRTH:  08-06-1925   DATE OF ADMISSION:  04/13/2005  DATE OF DISCHARGE:  LH                                HISTORY & PHYSICAL   CHIEF COMPLAINT:  Shortness of breath.   HISTORY OF PRESENT ILLNESS:  This is a 78 year old female patient with  multiple medical illnesses who was recently discharged from this hospital to  [facility].  She was brought to the emergency room due to shortness of  breath.  The patient was seen in the emergency room 24 hours ago due to  similar condition.  Her D-dimer was elevated.  CT scan of the chest was done  and pulmonary embolism was ruled out.  The patient has been on subcu Lovenox  for DVT prophylaxis.  She has recent history of accidental fall and multiple  bone fractures.  She underwent open reduction internal fixation of the left  hip.  Her chest x-ray that was done in the emergency room showed sign of  increased edema.  Possibility of pneumonia has been considered.  Her white  blood cell count was also elevated.  The patient was then started on IV  antibiotics and admitted for further treatment.  The patient was previously  also treated for congestive heart failure and pulmonary edema.  Possibility  of congestive heart failure is to be considered.   REVIEW OF SYSTEMS:  The patient feels generally weak.  She sometimes gets  confused.  The patient has been severely short of breath and coughing.  She  also had a low-grade fever between 99-100 degrees Fahrenheit.  No headaches,  chills, chest pain, nausea, vomiting, dysuria, urgency or frequency of  urination.   PAST MEDICAL HISTORY:  1.  Status post left hip fracture and open reduction internal fixation.  2.  History of fracture of her left distal radius.  3.  Fracture of  the left ribs.  4.  Status post syncopal episode.  5.  Seizure disorder.  6.  Hypertension.  7.  Congestive heart failure.  8.  Diabetes mellitus.  9.  History of non-Q wave MI.  10. Renal insufficiency.  11. Aspiration pneumonia.  12. Anemia.   CURRENT MEDICATIONS:  1.  Accu-Chek with sliding scale.  2.  Reglan 10 mg p.o. a.c. and nightly.  3.  Hydralazine 100 mg p.o. t.i.d.  4.  Plavix 75 mg daily.  5.  Aspirin 81 mg daily.  6.  Metoprolol 100 mg b.i.d.  7.  Norvasc 500 mg p.o. daily.  8.  Clonidine 0.1 mg t.i.d.  9.  Keppra 250 mg p.o. nightly.  10. Calcium with Vitamin D 500 mg p.o. b.i.d.  11. Calcitonin nasal spray, one spray alternating nostril.  12. Multivitamin one tablet p.o. daily.  13. Altace 10 mg p.o. nightly.  14. Lovenox 30 mg subcu.  15. Avandia 4 mg p.o. daily.  16.  Lotrel 5/500 one tablet p.o. q.6h.  17. Colace 100 mg p.o. b.i.d.   SOCIAL HISTORY:  The patient is married.  She is currently in the nursing  home.  No history of alcohol, tobacco or substance abuse.   PHYSICAL EXAMINATION:  GENERAL:  The patient is alert and awake, somewhat  confused and sick-looking.  VITAL SIGNS:  Blood pressure 231/88.  After treatment, blood pressure  145/64, pulse 88, respirations 20, temperature 99.8 degrees Fahrenheit.  HEENT:  Pupils are equal and reactive.  NECK:  Supple.  CHEST:  Decreased air entry.  There are bilateral rales at the bases of the  lung.  There is also scattered rhonchi.  CARDIAC:  S1, S2 heard.  Tachycardic.  No murmurs, rubs or gallops.  ABDOMEN:  Soft and lax, bowel sounds positive.  No mass or organomegaly.  EXTREMITIES:  1+ edema.   LABORATORY DATA AND X-RAY FINDINGS:  ABG on 100% face mask shows pH 7.28,  pCO2 51.8, pO2 167, bicarb 23 and saturations 98%.  WBC 13.3, hemoglobin  12.8, hematocrit 37.6, platelets 360.  PT 14.8, INR 1.1, PTT 23.  Sodium  133, potassium 3.9, chloride 104, carbon dioxide 25, glucose 232, BUN 10,  creatinine  0.8.  BNP 875, calcium 8.5.   Chest x-ray showed increased edema with interstitial opacity bilaterally and  peripheral air space opacity in the right lung.   ASSESSMENT:  1.  Probable pneumonia.  2.  Rule out congestive heart failure.  3.  Hypertension.  4.  Status post multiple fractures.  5.  Syncopal episode.  6.  Seizure disorder.  7.  History of coronary artery disease, status post MI.  8.  History of pulmonary edema.  9.  Anemia.  10. History of renal insufficiency.   PLAN:  1.  Will continue the patient on IV antibiotics.  2.  Will monitor her intake and output.  3.  Will give the patient IV diuretics.  4.  Will monitor her BMP.  5.  Continue the patient on oxygen therapy.  6.  Continue on regular treatment and supportive care.      Tesfaye D. [name], MD  Electronically Signed     TDF/MEDQ  D:  04/13/2005  T:  04/13/2005  Job:  [id number]

## 2010-07-21 NOTE — Group Therapy Note (Signed)
   [name], [name]                          ACCOUNT NO.:  [id number]   MEDICAL RECORD NO.:  [id number]                   PATIENT TYPE:  INP   LOCATION:  A331                                 FACILITY:  [facility]   PHYSICIAN:  Mila Homer. Sudie Bailey, M.D.           DATE OF BIRTH:  Sep 24, 1925   DATE OF PROCEDURE:  06/04/2002  DATE OF DISCHARGE:                                   PROGRESS NOTE   SUBJECTIVE:  The patient says she actually feels a good deal better today.  She has now been in the hospital several days on no antihypertensives, even  though she has had severe systolic hypertension in the past.   OBJECTIVE:  She is semi-recumbent in bed this morning, oriented and alert,  no acute distress; well-developed and thin.  She is chatting with her nephew  who is in the room with her.  She has already eaten some of her breakfast.  This morning, temperature is 100.5, pulse 112 with a respiratory rate 20,  blood pressure 130/67; recheck blood pressure 138/46.  Glucoses have been  134, 148 and 145.  The lungs are actually clear throughout, moving air well.  The heart has a regular rhythm but hyperdynamic, rate of around 80.  The  abdomen is soft and thin without hepatosplenomegaly or mass.  There is no  tenderness in the abdomen.  There is no edema of the ankles.   Today's blood work showed white cell count of 7800 with 80% neutrophils and  9% lymphs, H&H 10.4/31.0.  MET-7 showed a BUN 28, creatinine 1.0 -  significantly improved.  Her cardiac enzymes showed a CK-MB of 4.1 and a  troponin 0.07.  EKG was reviewed and showed no obvious acute ischemic  changes.   ASSESSMENT:  1. The patient's entire clinical spectrum over the last few weeks including     confusion a couple of weeks ago and then hypotension and inanition this     last week may all be secondary to a silent myocardial infarction.  2. Dehydration, much improved.  3. Systolic hypertension.  Blood pressure well controlled, which  may be     either secondary to hypovolemia from dehydration or to MI.   PLAN:  Cardiac echo today.  Repeat cardiac enzymes.  Cardiology consult.  Decrease the IV from 100 to 50 mL/hour.                                               Mila Homer. Sudie Bailey, M.D.    SDK/MEDQ  D:  06/04/2002  T:  06/05/2002  Job:  [id number]

## 2010-07-21 NOTE — Consult Note (Signed)
NAMELUCCA, GREGGS                ACCOUNT NO.:  [id number]   MEDICAL RECORD NO.:  [id number]          PATIENT TYPE:  INP   LOCATION:  IC06                          FACILITY:  [facility]   PHYSICIAN:  Kofi A. Gerilyn Pilgrim, M.D. DATE OF BIRTH:  [birth year]   DATE OF CONSULTATION:  DATE OF DISCHARGE:                                   CONSULTATION   REASON FOR CONSULTATION:  Confusion.   HISTORY OF PRESENT ILLNESS:  This is 77 year old black female who presented  to the emergency room with confusion and lethargy.  The patient had previous  hospitalization for shortness of breath __________ and also associated with  confusion __________ .  Initial workup __________  hypertension.  __________   There appears to be some ischemic areas __________ occipital area __________   __________ platelet count of 195 __________ chloride 103, CO2 __________ ,  glucose __________ , BUN 9, creatinine 1.1.  __________ . Liver enzymes  normal.  CPK __________ 204, repeat 402.  Urinalysis __________ .  Leukocyte  esterase negative.  Nitrates negative.   ASSESSMENT:  __________ encephalopathy __________ .   RECOMMENDATIONS:  1.  EEG.  2.  Prolactin level.  3.  Blood pressure control.      Kofi A. Gerilyn Pilgrim, M.D.  Electronically Signed     KAD/MEDQ  D:  03/23/2005  T:  03/23/2005  Job:  [id number]

## 2010-07-21 NOTE — Group Therapy Note (Signed)
   NAMESHALAN, NEAULT                          ACCOUNT NO.:  [id number]   MEDICAL RECORD NO.:  [id number]                   PATIENT TYPE:  INP   LOCATION:  IC06                                 FACILITY:  [facility]   PHYSICIAN:  Mila Homer. Sudie Bailey, M.D.           DATE OF BIRTH:  02/27/1926   DATE OF PROCEDURE:  10/27/2002  DATE OF DISCHARGE:                                   PROGRESS NOTE   SUBJECTIVE:  Feeling better today.   OBJECTIVE:  She is alert, oriented, distressed due to still the right hip  fracture, right humeral fracture.  She is well-developed but thin.  She is  still in the ICU.  Temperature is 100.1, pulse 86, respiratory rate 16,  blood pressure 168/69.  O2 saturations have been 97%, Accu-Chek 160.  Heart  has a regular rhythm this morning, rate of 70.  Lungs clear throughout,  moving air well.  Abdomen is soft.  The right leg is still in Buck's  traction, still shortened and everted.  The left leg is normal.   ASSESSMENT:  1. Right hip fracture.  2. Right humeral fracture.  3. Osteoporosis.  4. Hypertension.   PLAN:  Hip fracture repair - Dr. Hilda Lias, orthopedist this morning.  Will  watch her blood pressure and electrolytes carefully.  Reviewed her H&H from  this morning (9.9/29.2).                                               Mila Homer. Sudie Bailey, M.D.    SDK/MEDQ  D:  10/27/2002  T:  10/27/2002  Job:  [id number]

## 2010-07-21 NOTE — H&P (Signed)
[name], [name]                ACCOUNT NO.:  [id number]   MEDICAL RECORD NO.:  [id number]          PATIENT TYPE:  INP   LOCATION:  IC06                          FACILITY:  [facility]   PHYSICIAN:  Tesfaye D. [name], MD   DATE OF BIRTH:  [birth year]   DATE OF ADMISSION:  03/23/2005  DATE OF DISCHARGE:  LH                                HISTORY & PHYSICAL   CHIEF COMPLAINT:  Loss of consciousness.   HISTORY OF PRESENT ILLNESS:  This is a 77 year old female patient with a  history of multiple medical illnesses who was brought to the emergency room  with the above complaints.  The patient was found by her husband early this  morning unconscious andmaking some noise.  The husband called EMS and the  patient was brought to the emergency room.  According to the EMS, her eyes  were deviated to the left and her jaws were clenched.  The husband has not  noticed any convulsionor sign of accidental fall.  The patient was evaluated  in the emergency room where her blood pressure was initially found to be  260/110 with a pulse of 140 per minute.  The patient was given IV labetalol  and her blood pressure gradually brought down to 164/71.  A CT scan of the  head was done which no acute neurological event.  The patient was then  started on IV fluid and admitted to ICU to control her blood pressure and  evaluate further.   REVIEW OF SYSTEMS:  The patient is currently unresponsive to verbal  communication and unable to give a history.   PAST MEDICAL HISTORY:  1.  History of acute ventilatory-dependent respiratory failure.  2.  History of non-Q wave MI.  3.  Aspiration pneumonia.  4.  History of pulmonary edema.  5.  Hypokalemia.  6.  Renal insufficiency.  7.  Diabetes mellitus.   CURRENT MEDICATIONS:  1.  Isosorbide mononitrate 120 mg, one tablet p.o. every day.  2.  Furosemide 40 mg p.o. b.i.d.  3.  Potassium  daily.  4.  Reglan 10 mg p.o. b.i.d.  5.  Avandia 4 mg p.o. daily.  6.  Hydralazine  100 mg p.o. t.i.d.  7.  Altace 12.5 mg p.o. every day.  8.  Metoprolol 50 mg p.o. b.i.d.  9.  Plavix 75 mg p.o. every day.  10. Aspirin 81 mg p.o. daily.   SOCIAL HISTORY:  The patient is married.  She lives at home with her  husband.  The patient stopped tobacco smoking seven years ago, after she  smoked for more than forty years.  No history of alcohol or substance abuse.   PHYSICAL EXAMINATION:  GENERAL:  The patient is not responsive to verbal  communication.  However, she responds to painful stimuli.  VITAL SIGNS:  On admission, blood pressure 177/66, pulse 86, respiratory  rate 20, temperature 98.6 degrees Fahrenheit.  HEENT:  Pupils are equal, reactive.  NECK:  Supple.  CHEST:  Decreased air entry bilateral.  CARDIOVASCULAR:  First and second heart sounds heard.  No murmur.  No  gallop.  ABDOMEN:  Soft and lax.  Bowel sounds are positive.  No mass.  No  organomegaly.  EXTREMITIES:  No leg edema.   ADMISSION LABORATORY:  PH 7.323, pCO2 44, pO2 206, bicarb 22, saturation  99.2, and the patient is on 100% nonrebreather mask.  CBC:  WBC 9.3,  hemoglobin 12.7, hematocrit 36.8, and the platelets 195.  PT 14.8, INR 1.1.  Sodium 132, potassium 3.7, chloride of 103, carbon dioxide 19, glucose 228,  BUN 9, creatinine 1.1.  Bilirubin 0.8, ANCA164, AST 35, ALT 15, total  protein 7.2, albumin 3.8, calcium 7.1.  CPK 204, CK-MB 3.9, troponin 0.04.   ASSESSMENT:  This is a 77 year old female patient with a history of multiple  medical illnesses who is brought due to acute change in mental status.  The  patient has a blood pressure of 260/110.  The patient is obviously in  hypertensive crisis.  I am not quite sure what is cause of her change in  mental status and unresponsiveness.  Hypertensive encephalopathy could  definitely contribute to her mental status.  However, the possibility of  seizure disorder and arrhythmias has to be ruled out.   PLAN:  1.  We will admit the patient to  ICU.  2.  We will continue the patient on IV labetalol.  3.  We will do MRI of the head.  4.  We will do carotid Doppler.  5.  We will do EKG, cardiac enzymes, and an echocardiogram.  6.  We will do a neurology consult.      Tesfaye D. [name], MD  Electronically Signed     TDF/MEDQ  D:  03/23/2005  T:  03/23/2005  Job:  [id number]

## 2010-07-21 NOTE — Discharge Summary (Signed)
NAMECAMIRA, GEIDEL                ACCOUNT NO.:  [id number]   MEDICAL RECORD NO.:  [id number]          PATIENT TYPE:  INP   LOCATION:  IC06                          FACILITY:  [facility]   PHYSICIAN:  Tesfaye D. [name], MD   DATE OF BIRTH:  Jul 07, 1925   DATE OF ADMISSION:  07/30/2004  DATE OF DISCHARGE:  05/30/2006LH                                 DISCHARGE SUMMARY   DISCHARGE DIAGNOSES:  1. Acute ventilator-dependent respiratory failure.  2. Non-Q-wave myocardial infarction.  3. Aspiration pneumonia.  4. Pulmonary edema.  5. Hypokalemia.  6. Renal insufficiency.  7. Diabetes mellitus.   DISPOSITION:  The patient was transferred to [facility] under cardiology  service for further evaluation and possible cardiac catheterization. The  patient was admitted on Jul 30, 2004 by Dr. Oval Linsey for severe  shortness of breath and change in mental status. The patient was immediately  intubated in the emergency room and was admitted as a case of pulmonary  edema. She had serial EKG and cardiac enzymes which showed progressively  increasing troponin. The patient obviously had sign of non-Q-wave MI.  Cardiology consult was done, and arrangement was made for transfer to [facility]. She was transferred to [facility] on the cardiology  service for further evaluation and possible cardiac catheterization.       TDF/MEDQ  D:  08/22/2004  T:  08/22/2004  Job:  [id number]

## 2010-07-21 NOTE — Consult Note (Signed)
[name], [name] NO.:  [id number]   MEDICAL RECORD NO.:  [id number]          PATIENT TYPE:  INP   LOCATION:  2928                         FACILITY:  [facility]   PHYSICIAN:  Mindi Slicker. Lowell Guitar, M.D.  DATE OF BIRTH:  06/12/1925   DATE OF CONSULTATION:  08/02/2004  DATE OF DISCHARGE:                                   CONSULTATION   I was asked to see this 77 year old female with a history of atherosclerotic  cardiovascular disease admitted to [facility] on May 28,  with acute pulmonary edema secondary to acute myocardial infarction.  The  patient has known chronic kidney disease with a history of renal artery  stenosis by MRA in April of 2004, an atrophic right kidney, and cortical  venting by subsequent ultrasound.  Prior renal function has revealed the  following:  BUN and creatinine levels on November 03, 2002; 9/0.7, June 04, 2002, 28/1.0; Jul 30, 2004 (admission), 33/1.7; Aug 01, 2004, 44/2.2; Aug 01, 2004, 47/2.1; and Aug 02, 2004, 48/2.1.  There is a report in the chart  that the patient had a baseline serum creatinine of approximately 2.5 mgdl  in 2005, but we cannot find a copy of that report.  The patient was  intubated upon admission and then subsequently extubated today.  We were  asked to assist with management.   PAST MEDICAL HISTORY:  Status post VATS procedure for right lung mass,  status post resection.  Apparently this turned out to be lung cancer in  2000.  History of congestive heart failure, anemia, paroxysmal atrial  fibrillation, hypertension, history of right hip fracture, history of right  humeral fracture, history of abdominal aortic aneurysm which is being  monitored, osteoporosis, status post hysterectomy, status post appendectomy,  status post bilateral tubal ligation, ejection fraction 25 to 30%, history  of cigarette abuse, history of liver hemangioma by ultrasound.   CURRENT MEDICATIONS:  1.  Lipitor.  2.   Aspirin.  3.  Zithromax.  4.  Rocephin.  5.  Xopenex.  6.  Protonix.  7.  Insulin.   Social history, family history, and review of systems are unobtainable.  The  patient is confused.   PHYSICAL EXAMINATION:  VITAL SIGNS:  Blood pressure 132/50, temperature 100  degrees maximum.  I&O's over the last 8 hours; 360 in, 850 out.  GENERAL:  This is a confused African-American female.  HEENT:  Neck veins are distended.  LUNGS:  Decreased breath sounds.  HEART:  Regular rate and rhythm.  ABDOMEN:  Soft.  EXTREMITIES:  Trace edema.  NEUROLOGY:  No obvious focality, but the patient is confused.   LABORATORY DATA:  Urinalysis with specific gravity of 1.015, pH 5.0, 100 mg  of protein, 100 mg of glucose, 7 to 10 white blood cells, 7 to 10 red blood  cells.  Abdominal and renal ultrasound done in May of 2004, showed a right  kidney of 10.1 cm, left kidney 12.6 cm with thin cortex.   ASSESSMENT:  1.  Probable acute renal failure, hemodynamically mediated with a history of  severe hypertension on admission, now improved after treatment.  2.  History of right renal artery stenosis by MRA as well as small right      kidney, and decreased cardiac output secondary to the acute myocardial      infarction.  3.  Underlying chronic kidney disease secondary to atherosclerotic renal      vascular disease and nephrosclerosis.   RECOMMENDATIONS:  1.  Supportive therapy.  2.  Optimize cardiac output.  3.  Need more data, serum creatinines in the year 2005 would be helpful if      available to help [place] age chronicity.  4.  If contrast is needed for coronary angiography, would recommend      minimizing contrast volume, no left ventriculogram, no aortogram,      hydration as tolerated, and Mucomyst by mouth.   We will follow with you.  Thanks for allowing [place] to see the patient.       ACP/MEDQ  D:  08/02/2004  T:  08/03/2004  Job:  [id number]   cc:   Thomas C. Wall, M.D.

## 2010-07-21 NOTE — Group Therapy Note (Signed)
   [name], [name]                          ACCOUNT NO.:  [id number]   MEDICAL RECORD NO.:  [id number]                   PATIENT TYPE:  INP   LOCATION:  A331                                 FACILITY:  [facility]   PHYSICIAN:  Mila Homer. Sudie Bailey, M.D.           DATE OF BIRTH:  [birth year]   DATE OF PROCEDURE:  06/03/2002  DATE OF DISCHARGE:                                   PROGRESS NOTE   SUBJECTIVE:  The patient is feeling somewhat better today.  She is not  complaining of abdominal pain.  She was admitted yesterday with dehydration  and abdominal pain.  I desired MRI of the abdomen to see if she had any  ischemic bowel but I heard from x-ray that this cannot be done at [facility] at present and she will have to be transferred to [facility] 30 miles away to have this done adequately.   OBJECTIVE:  She is supine in bed.  Appears to be alert and oriented, no  acute distress at present.  She is thin but well-developed.  Temperature  99.9, pulse 72, respiratory rate 20, blood pressure 110/44.  Glucoses have  been 118 and 145.  The lungs are clear throughout.  The heart is  hyperdynamic, rate of about 110.  Skin turgor looks normal, mucous membranes  moist.  The abdomen is soft without hepatosplenomegaly or mass, no  tenderness on palpation, and there is trace edema of the ankles bilaterally.   Today the white cell count is 8100 of which 81% are neutrophils, 10 lymphs.  The H&H is 10.1/30.0.  Her BUN was 56, creatinine 1.6, down from a BUN of 64  yesterday, and the potassium is low at 3.3, sodium 133.  Her glucose is 132.   ASSESSMENT:  1. Dehydration, improved on IV one-half normal saline at 100 mL/hour.  2. Hypertension, which is well controlled even off all of her usual     antihypertensives.  3. Diabetes, which is stable.  4. Lung cancer status post VATS procedure by Dr. Edwyna Shell in [place] in     2000.   PLAN:  Continue with IV fluids.  At present will not  add back her  antihypertensives.  As long as she is stable now would not change treatment  but if she has recurrent abdominal pain with no cause consider having an MRI  done at [facility].                                               Mila Homer. Sudie Bailey, M.D.    SDK/MEDQ  D:  06/03/2002  T:  06/04/2002  Job:  [id number]

## 2010-07-21 NOTE — Group Therapy Note (Signed)
NAMEHORACE, WISHON                ACCOUNT NO.:  [id number]   MEDICAL RECORD NO.:  [id number]          PATIENT TYPE:  INP   LOCATION:  A223                          FACILITY:  [facility]   PHYSICIAN:  Vickki Hearing, M.D.DATE OF BIRTH:  1925-10-16   DATE OF PROCEDURE:  04/04/2005  DATE OF DISCHARGE:                                   PROGRESS NOTE   Status post cast application, left wrist, for distal radius fracture. Status  post left hip open reduction internal fixation with gamma nail.  Postoperative day #1 has stable Chem-7. Vital signs are 99.6, 77, 20 and  155/70. Glucose 161. Input/output 1,185/400 with a net of 785, urine output  400. Today, we can start Lovenox. We do need to get a hemoglobin on her, and  she can get out of bed to chair today.      Vickki Hearing, M.D.  Electronically Signed     SEH/MEDQ  D:  04/04/2005  T:  04/04/2005  Job:  [id number]

## 2010-07-21 NOTE — Group Therapy Note (Signed)
   [name], [name]                          ACCOUNT NO.:  [id number]   MEDICAL RECORD NO.:  [id number]                   PATIENT TYPE:  INP   LOCATION:  A212                                 FACILITY:  [facility]   PHYSICIAN:  Mila Homer. Sudie Bailey, M.D.           DATE OF BIRTH:  [birth year]   DATE OF PROCEDURE:  07/16/2002  DATE OF DISCHARGE:                                   PROGRESS NOTE   SUBJECTIVE:  She is still having upper abdominal pain.  If she is very quiet  she does not notice it at all, but immediately if she starts to move the  least little bit she notes a pulling upper abdominal pain.  The pain was  fairly severe last night when she came in.   OBJECTIVE:  The temperature today is 98.4, pulse 79, respiratory rate 20,  blood pressure 204/62 - although 148/92 a few hours earlier.  The weight is  108 pounds.  She is supine in bed, appears to be oriented and alert, no  acute distress unless she moves.  She is thin but well-developed.  The heart  has a regular rhythm without murmur, rate of 70.  The lungs appear clear  throughout.  The area of tenderness is really upper abdomen but on deep  palpation I cannot reproduce the tenderness.  There is no edema of the  ankles.   We did have her have an abdominal CT last night which showed a 3.6 cm  abdominal aortic aneurysm without evidence of rupture.  Diffuse  atherosclerosis is noted and also nonspecific liver lesions thought to be  possibly metastases, but no acute findings.   ASSESSMENT:  1. Abdominal pain, question etiology.  2. Abdominal aortic aneurysm.  3. Diffuse atherosclerosis of the abdominal blood vessels.  4. Lung cancer status post video-assisted thorascopic surgery, now four     years ago.  5. Liver lesions, question metastatic disease.   PLAN:  Review with radiology.  The liver lesions will need to be compared to  her prior CTs of which she has had a number in the last several months.  General surgical review for  the abdominal pain.  Continue with Darvocet  q.4h. at present for pain.                                               Mila Homer. Sudie Bailey, M.D.    SDK/MEDQ  D:  07/16/2002  T:  07/16/2002  Job:  [id number]

## 2010-07-21 NOTE — Group Therapy Note (Signed)
   [name], [name]                          ACCOUNT NO.:  [id number]   MEDICAL RECORD NO.:  [id number]                   PATIENT TYPE:  INP   LOCATION:  IC06                                 FACILITY:  [facility]   PHYSICIAN:  Mila Homer. Sudie Bailey, M.D.           DATE OF BIRTH:  1926/01/07   DATE OF PROCEDURE:  10/26/2002  DATE OF DISCHARGE:                                   PROGRESS NOTE   SUBJECTIVE:  The patient feels better today.  She is able to give me more  history than she was yesterday.  Apparently she was at church, slipped on  the steps, and fell.  She does not remember that her hip gave-way to start  this but rather that she slipped.   OBJECTIVE:  Today temperature is 100.8, pulse 66, respiratory rate 20, blood  pressure 125/44.  Lungs appear clear throughout, she is moving air well.  The heart has a regular rhythm, rate of about 70.  The abdomen is soft.  There is just trace edema of the left leg.  The right leg is in Buck's  traction.  Today she appears to be a good historian.  Sentence structure is  intact.  There is no slurring of the speech.   Lab work today showed potassium now 3.7, sodium 137, glucose mildly elevated  at 125, BUN 9, creatinine 0.8.  Blood gas on FIO2 of 28% showed PO2 of 94,  PCO2 of 42.   ASSESSMENT:  1. Right hip fracture.  2. Right humeral fracture.  3. Osteoporosis.  4. Electrolyte abnormalities, now cleared.  5. Hypoxia on admission with no evidence for pulmonary emboli on her lung     CT.   PLAN:  I believe the patient is now stable for surgery and Dr. Hilda Lias is  notified.                                                Mila Homer. Sudie Bailey, M.D.    SDK/MEDQ  D:  10/26/2002  T:  10/26/2002  Job:  [id number]

## 2010-07-21 NOTE — Discharge Summary (Signed)
NAMEKYLEEANN, CREMEANS                ACCOUNT NO.:  [id number]   MEDICAL RECORD NO.:  [id number]          PATIENT TYPE:  INP   LOCATION:  4712                         FACILITY:  [facility]   PHYSICIAN:  Thomas C. Wall, M.D.   DATE OF BIRTH:  06/10/1925   DATE OF ADMISSION:  08/01/2004  DATE OF DISCHARGE:  08/22/2004                                 DISCHARGE SUMMARY   PRINCIPAL DIAGNOSIS:  Non-ST elevation myocardial infarction.   OTHER DIAGNOSES:  1.  Hypertension.  2.  Hyperlipidemia.  3.  Type 2 diabetes mellitus.  4.  Coronary artery disease.  5.  Peripheral vascular disease.  6.  History of cerebrovascular accident.  7.  Chronic renal insufficiency.  8.  Osteoporosis.  9.  Anemia of chronic disease.  10. Abdominal aortic aneurysm.  11. Lung cancer.   ALLERGIES:  PENICILLIN.   PROCEDURES:  1.  Echocardiogram.  2.  Left heart cardiac catherization.   HISTORY OF PRESENT ILLNESS:  This 77 year old African-American female with  the above problem list who presented to [facility] on Jul 30, 2004  with complaints of significant shortness of breath.  She was noted to be in  pulmonary edema and ruled in for non-ST elevation MI.  She was intubated in  the emergency room and admitted to the ICU and then transferred to [facility] on Aug 01, 2004.   HOSPITAL COURSE:  The patient was admitted to the CCU at [facility] and  remained intubated on dopamine until Aug 01, 2004.  She was eventually  extubated on Aug 02, 2004.  She peaked her CK at 95, MB 31.4 and troponin I  at 16.30.  Following sedation she remained in IV diuresis.  An  echocardiogram was performed August 03, 2004 showing an EF of 25% with moderate  to severe AI and moderate MR.  She was maintained on Hydralazine nitrate and  beta blocker and while in the CCU experienced intermittent paroxysmal atrial  fibrillation.  She was not initiated on Coumadin therapy as she was felt to  be a poor Coumadin candidate.  She was  eventually transferred out to the  floor and on August 07, 2004 complained of nausea and vomiting with an ECG that  showed sinus tachycardia with PACs and anterior ST elevations.  She was  transferred back to the CCU.  Her cardiac enzymes continued to trend down  from admission rather than bumping back up.  GI was consulted and  recommended continuation of current medical therapy an added Reglan b.i.d.  The renal service was also consulted during this admission secondary to  chronic renal insufficiency and they were kind enough to follow her along.  Her renal status has remained at baseline during this admission.  The  patient was also seen by CVTS and Dr. Edwyna Shell secondary to history of a lung  mass.  The patient underwent a PET scan on August 11, 2004, which showed no  abnormal FDG uptake to suggest presence of recurrent tumor.  Therefore, no  additional workup in regards to lung cancer was performed.  The patient's  renal status and GI status had improved and she underwent cardiac  catheterization on August 18, 2004 which showed a subtotal occlusion of the  right coronary artery and otherwise insignificant coronary disease.  She did  have significant peripheral vascular disease with a total occlusion of the  left iliac and severe diffuse disease of the right iliac.  Medical  management was recommended.  She underwent followup echocardiogram on August 22, 2004 which showed marked improvement in LV function with an EF between  55-65% and otherwise no change in the valvular disorder as previously  recorded.  Her medications have been consolidated and she has now been on  oral Lasix for several days.  She is euvolemic on exam and being discharged  home today in satisfactory condition.   DISCHARGE LABORATORY:  Hemoglobin 10.8, hematocrit 31.8, WBC 4.9, platelets  191.  Sodium 134, potassium 3.6, chloride 104, CO2 23, BUN 11, creatinine  1.1, glucose 93, calcium 8.9, BNP 1604.2.   DISPOSITION:  The  patient is being discharged home in good condition.  She  will have home health PT and home health nursing available at home.   FOLLOW UP:  She has a followup with Dr. Elspeth Cho P.A. September 06, 2004 at 1:30 p.m.   DISCHARGE MEDICATIONS:  1.  Aspirin 81 mg daily.  2.  Lopressor 50 mg b.i.d.  3.  Lasix 40 mg b.i.d.  4.  Potassium 20 mEq b.i.d.  5.  Hydralazine 100 mg t.i.d.  6.  Imdur 120 mg daily.  7.  Altace 2.5 mg daily.  8.  Plavix 75 mg daily.  9.  Reglan 10 mg b.i.d. with meals.  10. Avandia 4 mg daily.  11. Nitroglycerin 0.4 mg sublingual p.r.n. severe chest pain.   OUTSTANDING LABORATORY STUDIES:  None.   TIME OF DISCHARGE ENCOUNTER:  Sixty minutes.       CRB/MEDQ  D:  08/22/2004  T:  08/22/2004  Job:  [id number]

## 2010-07-21 NOTE — Discharge Summary (Signed)
NAMEKAROL, SKARZYNSKI                          ACCOUNT NO.:  [id number]   MEDICAL RECORD NO.:  [id number]                   PATIENT TYPE:  INP   LOCATION:  A212                                 FACILITY:  [facility]   PHYSICIAN:  Mila Homer. Sudie Bailey, M.D.           DATE OF BIRTH:  09/16/1925   DATE OF ADMISSION:  07/15/2002  DATE OF DISCHARGE:  07/24/2002                                 DISCHARGE SUMMARY   INTRODUCTION:  This 77 year old presented to the hospital with severe upper  abdominal pain of sudden onset.  She had a fairly benign 10-day hospital  course extending from May 12 through Jul 24, 2002.  During that time vital  signs remained stable, although blood pressure was elevated on some  occasions.   LABORATORY WORK:  Laboratory work included a CBC showing H&H 10.5/30.6.  A  CMP was normal except for sodium 134, glucose 139.  Amylase and lipase were  normal.  CK and troponin were normal.   Her radiological workup included a chest that showed emphysema with  cardiomegaly and some minimal left base atelectasis versus infiltrate.  CT  of the abdomen had a number of findings including small bilateral pleural  effusions, basilar atelectasis left greater than right, three 5 mm low-  density lesions in the dome of the right lobe of the liver and a 1.6 cm  lesion in the mid right lobe.  There is a 3.6 cm in maximum diameter  abdominal aortic aneurysm.  CT of the pelvis showed uterus and appendicitis  essentially gone.  There was no bony metastatic disease noted of the lumbar  spine.  T-spine showed osteoporosis and minimal wedge fractures at T8 and  L1.  CT of the chest showed small pericardial effusion, dense coronary  artery calcifications, and right lung surgical scar-type changes.  No  cardiac enlargement noted.  Small left pleural effusion.  Ultrasound of the  abdomen showed the mid right lobe liver lesion with hemangioma and the small  areas were too small to characterize.  She  had no gallstones, and normal  common bile duct, and the 3.6 cm aneurysm as mentioned above.  Bone scan  showed two foci of increased activity in the left second and third ribs,  decreased activity at L4-5 with degenerative changes in the major joints and  atherosclerotic changes in the femoral arteries bilaterally.  MRI of the  thoracic spine showed a remote wedge compression fracture of T8 vertebral  body with retropulsion of a portion of the vertebral body into the central  canal, but no abnormal cord signal.   HOSPITAL COURSE:  The patient was admitted to the hospital with initial  diagnosis of questionable angina.  She was put on Darvocet-N 100 q.4h. for  pain with atenolol 50 mg daily, Catapres 0.2 mg b.i.d., and Lotrel 5/10  daily for hypertension.  She was started on Avandia 4 mg daily.   I  asked for consults from cardiology, general surgery, and GI.   She was put on Motrin 800 mg three times daily, Protonix 40 mg daily, Pepto-  Bismol p.r.n. from home.   She kept on having pain when she would move a certain way, twist a certain  way, but gradually got better and, by the day of discharge, was up and  around doing things in her room.   FINAL DIAGNOSES:  1. Musculoskeletal pain of the upper abdomen, lower thoracic region.  2. Old wedge compression fracture of T8.  3. Abdominal aortic aneurysm.  4. Pericardial effusion.  5. Pleural effusion.  6. Hemangioma of the liver.  7. Coronary atherosclerosis.  8. Femoral atherosclerosis.  9. Diabetes.  10.      Essential hypertension.  11.      Right renal artery stenosis.   DISCHARGE MEDICATIONS:  1. Ibuprofen 200 mg OTC 4 three times daily.  2. She is to continue with her antihypertensive at home.   DISCHARGE INSTRUCTIONS:  Home health to come out to the house and check her  BP and pain level three times a week for two weeks.   FOLLOW-UP:  Follow up with [place] next month.                                               Mila Homer. Sudie Bailey, M.D.    SDK/MEDQ  D:  07/24/2002  T:  07/24/2002  Job:  [id number]

## 2010-07-21 NOTE — Group Therapy Note (Signed)
   [name], [name]                            ACCOUNT NO.:  [id number]   MEDICAL RECORD NO.:  [id number]                    PATIENT TYPE:   LOCATION:                                       FACILITY:   PHYSICIAN:  Edward L. Juanetta Gosling, M.D.             DATE OF BIRTH:   DATE OF PROCEDURE:  DATE OF DISCHARGE:                                   PROGRESS NOTE   PRIMARY CARE PHYSICIAN:  Patient of Dr. Sudie Bailey   SUBJECTIVE:  This patient says that she is doing fairly well.  She still has  pain in the left rib area that is associated with movement.  She has no  other new complaints.  She says that the pain is getting on her nerves and  she is not able to deal with it.  She is noted on the CT and pelvis to have  body wall edema, but no abdominal wall mass lesion.  We are awaiting the  results of CT of the chest.   OBJECTIVE:  Her exam today shows temperature is 98.2; pulse is 70;  respirations 18.  Blood sugars 82, blood pressure 215/72.  O2 saturation is  91% on room air.  Her chest is actually fairly clear; although she does  splint on the left side a little bit.  I&O is minus 1.   ASSESSMENT/PLAN:  She continues to have abdominal pain of unknown cause.  Her blood pressure is up which may be related to renal artery stenosis which  is a known diagnosis.  She has abdominal aortic aneurysm that is stable by  CT criteria.  She has had a lung cancer, and we do not have any evidence  that that is a recurrence.  She is now quite anxious. I am going to put her  on something for anxiety.  I will try to find about her CT of the lung.                                               Edward L. Juanetta Gosling, M.D.    ELH/MEDQ  D:  07/19/2002  T:  07/19/2002  Job:  [id number]

## 2010-07-22 LAB — GLUCOSE, CAPILLARY: Glucose-Capillary: 267 mg/dL — ABNORMAL HIGH (ref 70–99)

## 2010-07-23 LAB — GLUCOSE, CAPILLARY: Glucose-Capillary: 218 mg/dL — ABNORMAL HIGH (ref 70–99)

## 2010-07-24 LAB — GLUCOSE, CAPILLARY: Glucose-Capillary: 104 mg/dL — ABNORMAL HIGH (ref 70–99)

## 2010-07-25 LAB — GLUCOSE, CAPILLARY: Glucose-Capillary: 128 mg/dL — ABNORMAL HIGH (ref 70–99)

## 2010-07-26 LAB — GLUCOSE, CAPILLARY: Glucose-Capillary: 123 mg/dL — ABNORMAL HIGH (ref 70–99)

## 2010-07-28 ENCOUNTER — Inpatient Hospital Stay (HOSPITAL_COMMUNITY)
Admission: EM | Admit: 2010-07-28 | Discharge: 2010-08-01 | DRG: 194 | Disposition: A | Discharge status: Skilled Nursing Facility | Payer: Medicare Other | Source: Ambulatory Visit | Attending: Otolaryngology | Admitting: Otolaryngology

## 2010-07-28 ENCOUNTER — Ambulatory Visit (HOSPITAL_COMMUNITY): Payer: Medicare Other | Attending: Internal Medicine

## 2010-07-28 ENCOUNTER — Inpatient Hospital Stay (HOSPITAL_COMMUNITY): Payer: Medicare Other

## 2010-07-28 ENCOUNTER — Emergency Department (HOSPITAL_COMMUNITY): Payer: Medicare Other

## 2010-07-28 ENCOUNTER — Ambulatory Visit (HOSPITAL_COMMUNITY)
Admission: EM | Admit: 2010-07-28 | Discharge: 2010-07-28 | Disposition: A | Discharge status: Home / Self Care | Payer: Medicare Other | Source: Ambulatory Visit | Attending: Emergency Medicine | Admitting: Emergency Medicine

## 2010-07-28 DIAGNOSIS — M8448XA Pathological fracture, other site, initial encounter for fracture: Secondary | ICD-10-CM | POA: Diagnosis present

## 2010-07-28 DIAGNOSIS — I517 Cardiomegaly: Secondary | ICD-10-CM | POA: Insufficient documentation

## 2010-07-28 DIAGNOSIS — R5381 Other malaise: Secondary | ICD-10-CM | POA: Insufficient documentation

## 2010-07-28 DIAGNOSIS — I10 Essential (primary) hypertension: Secondary | ICD-10-CM | POA: Diagnosis present

## 2010-07-28 DIAGNOSIS — J189 Pneumonia, unspecified organism: Principal | ICD-10-CM | POA: Diagnosis present

## 2010-07-28 DIAGNOSIS — G40909 Epilepsy, unspecified, not intractable, without status epilepticus: Secondary | ICD-10-CM | POA: Diagnosis present

## 2010-07-28 DIAGNOSIS — F0390 Unspecified dementia without behavioral disturbance: Secondary | ICD-10-CM | POA: Diagnosis present

## 2010-07-28 DIAGNOSIS — Z66 Do not resuscitate: Secondary | ICD-10-CM | POA: Diagnosis present

## 2010-07-28 DIAGNOSIS — E860 Dehydration: Secondary | ICD-10-CM | POA: Diagnosis present

## 2010-07-28 DIAGNOSIS — R918 Other nonspecific abnormal finding of lung field: Secondary | ICD-10-CM | POA: Insufficient documentation

## 2010-07-28 DIAGNOSIS — E876 Hypokalemia: Secondary | ICD-10-CM | POA: Diagnosis present

## 2010-07-28 LAB — URINALYSIS, ROUTINE W REFLEX MICROSCOPIC
Glucose, UA: >1000 mg/dL — AB
Leukocytes, UA: NEGATIVE
Protein, ur: 100 mg/dL — AB
Specific Gravity, Urine: 1.025 (ref 1.005–1.030)
pH: 5.5 (ref 5.0–8.0)

## 2010-07-28 LAB — LACTIC ACID, PLASMA: Lactic Acid, Venous: 1.4 mmol/L (ref 0.5–2.2)

## 2010-07-28 LAB — COMPREHENSIVE METABOLIC PANEL
Alkaline Phosphatase: 88 U/L (ref 39–117)
BUN: 17 mg/dL (ref 6–23)
Creatinine, Ser: 0.74 mg/dL (ref 0.4–1.2)
Glucose, Bld: 154 mg/dL — ABNORMAL HIGH (ref 70–99)
Potassium: 3.4 mEq/L — ABNORMAL LOW (ref 3.5–5.1)
Total Bilirubin: 0.8 mg/dL (ref 0.3–1.2)
Total Protein: 8.2 g/dL (ref 6.0–8.3)

## 2010-07-28 LAB — LIPASE, BLOOD: Lipase: 20 U/L (ref 11–59)

## 2010-07-28 LAB — URINE MICROSCOPIC-ADD ON

## 2010-07-28 LAB — GLUCOSE, CAPILLARY
Glucose-Capillary: 324 mg/dL — ABNORMAL HIGH (ref 70–99)
Glucose-Capillary: 346 mg/dL — ABNORMAL HIGH (ref 70–99)
Glucose-Capillary: 434 mg/dL — ABNORMAL HIGH (ref 70–99)

## 2010-07-28 MED ORDER — IOHEXOL 300 MG/ML  SOLN
100.0000 mL | Freq: Once | INTRAMUSCULAR | Status: AC | PRN
  Administered 2010-07-28: 100 mL via INTRAVENOUS

## 2010-07-29 ENCOUNTER — Inpatient Hospital Stay (HOSPITAL_COMMUNITY): Payer: Medicare Other

## 2010-07-29 LAB — GLUCOSE, CAPILLARY: Glucose-Capillary: 112 mg/dL — ABNORMAL HIGH (ref 70–99)

## 2010-07-29 LAB — CBC
Platelets: 177 10*3/uL (ref 150–400)
RBC: 4.78 MIL/uL (ref 3.87–5.11)
RDW: 13.4 % (ref 11.5–15.5)
WBC: 18.9 10*3/uL — ABNORMAL HIGH (ref 4.0–10.5)

## 2010-07-29 LAB — DIFFERENTIAL
Basophils Absolute: 0 10*3/uL (ref 0.0–0.1)
Eosinophils Absolute: 0 10*3/uL (ref 0.0–0.7)
Eosinophils Relative: 0 % (ref 0–5)
Lymphocytes Relative: 4 % — ABNORMAL LOW (ref 12–46)
Neutrophils Relative %: 88 % — ABNORMAL HIGH (ref 43–77)

## 2010-07-30 LAB — GLUCOSE, CAPILLARY
Glucose-Capillary: 133 mg/dL — ABNORMAL HIGH (ref 70–99)
Glucose-Capillary: 117 mg/dL — ABNORMAL HIGH (ref 70–99)
Glucose-Capillary: 128 mg/dL — ABNORMAL HIGH (ref 70–99)

## 2010-07-30 LAB — BASIC METABOLIC PANEL
BUN: 26 mg/dL — ABNORMAL HIGH (ref 6–23)
Creatinine, Ser: 1.16 mg/dL (ref 0.4–1.2)
GFR calc non Af Amer: 45 mL/min — ABNORMAL LOW (ref >60)
Potassium: 5.1 mEq/L (ref 3.5–5.1)

## 2010-07-30 LAB — DIFFERENTIAL
Eosinophils Absolute: 0 10*3/uL (ref 0.0–0.7)
Eosinophils Relative: 0 % (ref 0–5)
Lymphs Abs: 1.1 10*3/uL (ref 0.7–4.0)

## 2010-07-30 LAB — CBC
MCV: 92.8 fL (ref 78.0–100.0)
Platelets: 138 10*3/uL — ABNORMAL LOW (ref 150–400)
RDW: 13.6 % (ref 11.5–15.5)
WBC: 11.2 10*3/uL — ABNORMAL HIGH (ref 4.0–10.5)

## 2010-07-30 LAB — MAGNESIUM: Magnesium: 2.5 mg/dL (ref 1.5–2.5)

## 2010-07-31 LAB — GLUCOSE, CAPILLARY
Glucose-Capillary: 58 mg/dL — ABNORMAL LOW (ref 70–99)
Glucose-Capillary: 130 mg/dL — ABNORMAL HIGH (ref 70–99)

## 2010-08-01 ENCOUNTER — Inpatient Hospital Stay
Admission: RE | Admit: 2010-08-01 | Discharge: 2016-03-29 | Disposition: A | Discharge status: Other Acute Inpatient Hospital | Payer: BC Managed Care – PPO | Source: Ambulatory Visit | Attending: Internal Medicine | Admitting: Internal Medicine

## 2010-08-01 DIAGNOSIS — K469 Unspecified abdominal hernia without obstruction or gangrene: Secondary | ICD-10-CM

## 2010-08-01 DIAGNOSIS — J189 Pneumonia, unspecified organism: Secondary | ICD-10-CM

## 2010-08-01 DIAGNOSIS — Z09 Encounter for follow-up examination after completed treatment for conditions other than malignant neoplasm: Secondary | ICD-10-CM

## 2010-08-01 DIAGNOSIS — I714 Abdominal aortic aneurysm, without rupture, unspecified: Secondary | ICD-10-CM

## 2010-08-01 DIAGNOSIS — I719 Aortic aneurysm of unspecified site, without rupture: Secondary | ICD-10-CM

## 2010-08-01 DIAGNOSIS — W19XXXA Unspecified fall, initial encounter: Secondary | ICD-10-CM

## 2010-08-01 LAB — CBC
HCT: 39.5 % (ref 36.0–46.0)
MCH: 30.6 pg (ref 26.0–34.0)
MCHC: 32.9 g/dL (ref 30.0–36.0)
MCV: 92.9 fL (ref 78.0–100.0)
RDW: 13.5 % (ref 11.5–15.5)

## 2010-08-01 LAB — BASIC METABOLIC PANEL
BUN: 19 mg/dL (ref 6–23)
Calcium: 9.7 mg/dL (ref 8.4–10.5)
GFR calc non Af Amer: >60 mL/min (ref >60)
Glucose, Bld: 147 mg/dL — ABNORMAL HIGH (ref 70–99)

## 2010-08-01 LAB — DIFFERENTIAL
Eosinophils Relative: 3 % (ref 0–5)
Monocytes Absolute: 1.2 10*3/uL — ABNORMAL HIGH (ref 0.1–1.0)
Monocytes Relative: 10 % (ref 3–12)
Neutro Abs: 9 10*3/uL — ABNORMAL HIGH (ref 1.7–7.7)
Neutrophils Relative %: 76 % (ref 43–77)

## 2010-08-02 LAB — CULTURE, BLOOD (ROUTINE X 2): Culture: NO GROWTH

## 2010-08-02 LAB — GLUCOSE, CAPILLARY: Glucose-Capillary: 276 mg/dL — ABNORMAL HIGH (ref 70–99)

## 2010-08-03 LAB — GLUCOSE, CAPILLARY
Glucose-Capillary: 147 mg/dL — ABNORMAL HIGH (ref 70–99)
Glucose-Capillary: 339 mg/dL — ABNORMAL HIGH (ref 70–99)

## 2010-08-04 LAB — GLUCOSE, CAPILLARY
Glucose-Capillary: 204 mg/dL — ABNORMAL HIGH (ref 70–99)
Glucose-Capillary: 208 mg/dL — ABNORMAL HIGH (ref 70–99)
Glucose-Capillary: 294 mg/dL — ABNORMAL HIGH (ref 70–99)

## 2010-08-04 NOTE — H&P (Signed)
[name], [name]                ACCOUNT NO.:  [id number]  MEDICAL RECORD NO.:  [id number]           PATIENT TYPE:  LOCATION:                                 FACILITY:  PHYSICIAN:  Tarry Kos, MD       DATE OF BIRTH:  08/21/1925  DATE OF ADMISSION: DATE OF DISCHARGE:  LH                             HISTORY & PHYSICAL   CHIEF COMPLAINT:  Leukocytosis.  Sent here from SNF.  HISTORY OF PRESENT ILLNESS:  This is an 77 year old female, who has advanced dementia, history of COPD, diabetes, seizure disorder, who comes to the ED, was sent here because supposedly of an elevated white count of 20.  I was told by the emergency room physician that she had a CBC done today as an outpatient and her white count was 20, but however I cannot find a hard copy of this on the chart and this was not repeated here in the ED.  The patient cannot provide any history and there is limited history from the emergency room, however, she is a nursing home patient with advanced dementia.  Review of systems, otherwise unobtainable.  All the rest of the history is obtained from her past medical record.  PAST MEDICAL HISTORY:  Diabetes, Alzheimer's dementia, coronary artery disease, history of CHF, seizure disorder, COPD, osteoporosis, history of lung cancer with surgical resection in 2000, status post appendectomy, status post hysterectomy, status post BTL, status post left hip repair, history of renal insufficiency, multiple pelvic fractures in January 2011.  ALLERGIES:  PENICILLIN.  SOCIAL HISTORY:  She lives at a nursing home.  I was told by the emergency room physician that she is DNR.  However, again I do not see any DNR paperwork from the nursing home on the chart, and according to her MAR from the nursing home, it says she is a full code.  MEDICATIONS: 1. Lexapro 10 mg daily. 2. Fosamax 70 mg weekly. 3. Norvasc 10 mg daily. 4. Vitamin D3 1000 mg daily. 5. Docusate 100 mg daily. 6. Aspirin  81 mg daily. 7. Multivitamin a day. 8. Calcium 500 mg twice a day. 9. Xanax 0.25 mg one half tablet twice a day. 10.Clonidine 0.3 mg twice a day. 11.Lopressor 25 mg 3 tablets twice a day. 12.Levemir 4 units subcu at bedtime. 13.Milk of magnesia as needed. 14.Phenergan as needed. 15.Spironolactone 25 mg one half tablet daily. 16.Altace 10 mg daily. 17.Keppra 250 mg at bedtime.  PHYSICAL EXAMINATION:  VITAL SIGNS:  Afebrile, temperature is 97.7, initial blood pressure 197/63, heart rate 87, respirations 20, and 95% O2 sats on 2 liters nasal cannula. GENERAL:  She is alert, in no apparent distress, nonverbal at her baseline with advanced dementia. HEENT:  Extraocular muscles intact.  Pupils equal and reactive to light. Oropharynx is clear.  Mucous membranes are moist. NECK:  No JVD.  No carotid bruits. CARDIAC:  Regular rate and rhythm without murmurs or gallops. CHEST:  Clear to auscultation bilaterally.  No wheezing, rhonchi, or rales. ABDOMEN:  Soft, nontender.  Positive bowel sounds.  No hepatosplenomegaly. EXTREMITIES:  No clubbing, cyanosis, or edema. PSYCH:  Normal mood and affect. NEURO:  No focal neurologic deficits.  LABS:  Lactic acid level is 1.4.  Lipase level is 20.  CMP:  Sodium is 144, potassium is 3.4.  BUN and creatinine are normal.  Urinalysis, large amount of blood, negative nitrite, negative leukocytes, white blood cell 0-2.  Chest x-ray, no infiltrates.  CT of the abdomen and pelvis, no acute issues, stress fractures noted in the pelvis.  ASSESSMENT AND PLAN:  This is an 77 year old female, who was sent here for leukocytosis of unclear reason. 1. Leukocytosis.  I am going to repeat her CBC as I cannot find a hard     copy of the white count of 20 that was reported to me by the ED.     There is no hard copy on the chart, so I am going to repeat this to     clarify whether or not her white count is really that elevated.     She is afebrile.  Her chest x-ray  is negative for any infiltrate.     Urinalysis is pretty clean.  I am going to hold off on any     antibiotics at this time.  We will obtain urine culture, blood     cultures, sputum cultures, and repeat a chest x-ray in the morning.     It was also reported that she was sent here for abdominal pain,     however, the patient right now is not complaining of anything, but     I am not really sure what her baseline status is with her dementia.     However, her CT of her abdomen and pelvis did not show anything     acute, and her lipase is normal, LFTs are normal. 2. Advanced dementia, stable. 3. It was reported by the ED that she was DNR, however, I cannot find     any DNR paperwork on the chart and it is actually listed on her MAR     that she is a full code.  We will keep her full code for now and     clarify this with the nursing home. 4. Insulin-dependent diabetes.  Continue her regimen from prior to     this admission. 5. Certainly if the patient runs a fever, we will empirically start     antibiotics without a clear source due to her high-risk nature of     infection.  Again, we will also await the results of repeat CBC.                                           ______________________________ Tarry Kos, MD     RD/MEDQ  D:  07/28/2010  T:  07/29/2010  Job:  [id number]  Electronically Signed by Tarry Kos MD on 08/04/2010 11:51:16 AM

## 2010-08-05 LAB — GLUCOSE, CAPILLARY
Glucose-Capillary: 165 mg/dL — ABNORMAL HIGH (ref 70–99)
Glucose-Capillary: 230 mg/dL — ABNORMAL HIGH (ref 70–99)

## 2010-08-06 LAB — GLUCOSE, CAPILLARY
Glucose-Capillary: 157 mg/dL — ABNORMAL HIGH (ref 70–99)
Glucose-Capillary: 216 mg/dL — ABNORMAL HIGH (ref 70–99)
Glucose-Capillary: 318 mg/dL — ABNORMAL HIGH (ref 70–99)

## 2010-08-07 LAB — GLUCOSE, CAPILLARY

## 2010-08-08 ENCOUNTER — Ambulatory Visit (HOSPITAL_COMMUNITY): Payer: Medicare Other | Attending: Internal Medicine

## 2010-08-08 DIAGNOSIS — R1310 Dysphagia, unspecified: Secondary | ICD-10-CM | POA: Insufficient documentation

## 2010-08-08 LAB — GLUCOSE, CAPILLARY
Glucose-Capillary: 219 mg/dL — ABNORMAL HIGH (ref 70–99)
Glucose-Capillary: 133 mg/dL — ABNORMAL HIGH (ref 70–99)

## 2010-08-09 LAB — GLUCOSE, CAPILLARY
Glucose-Capillary: 118 mg/dL — ABNORMAL HIGH (ref 70–99)
Glucose-Capillary: 182 mg/dL — ABNORMAL HIGH (ref 70–99)

## 2010-08-10 ENCOUNTER — Ambulatory Visit (HOSPITAL_COMMUNITY): Payer: Medicare Other | Attending: Internal Medicine

## 2010-08-10 DIAGNOSIS — R1310 Dysphagia, unspecified: Secondary | ICD-10-CM | POA: Insufficient documentation

## 2010-08-10 LAB — GLUCOSE, CAPILLARY: Glucose-Capillary: 181 mg/dL — ABNORMAL HIGH (ref 70–99)

## 2010-08-10 NOTE — Discharge Summary (Signed)
NAMELETHIA, DONLON                ACCOUNT NO.:  [id number]  MEDICAL RECORD NO.:  [id number]           PATIENT TYPE:  I  LOCATION:  A330                          FACILITY:  [facility]  PHYSICIAN:  Derry Kassel L. Lendell Caprice, MDDATE OF BIRTH:  [birth year]  DATE OF ADMISSION:  07/28/2010 DATE OF DISCHARGE:  05/29/2012LH                              DISCHARGE SUMMARY   DISCHARGE DIAGNOSES: 1. Healthcare-associated pneumonia. 2. Advanced dementia. 3. Dehydration. 4. Type 2 diabetes. 5. Hypokalemia. 6. Multiple old and new compression and stress factors. 7. Hypertension. 8. Do not resuscitate.  DISCHARGE MEDICATIONS: 1. Avelox 400 mg daily until August 04, 2010. 2. Glucerna twice a day between meals. 3. Spironolactone 12.5 mg a day. 4. Xanax 0.25 mg one half tablet nightly, hold for sedation. 5. Altace 10 mg nightly. 6. Aspirin 81 mg a day. 7. Calcium 500 mg b.i.d. 8. Colace 100 mg daily. 9. Fosamax 70 mg weekly. 10.Keppra 250 mg nightly.11.Levemir 4 units subcutaneously nightly. 12.Lopressor 75 mg p.o. b.i.d. 13.Milk of magnesia 2 tablespoons daily as needed for constipation. 14.Multivitamin a day. 15.Norvasc 10 mg a day. 16.Phenergan 12.5 mg every 6 hours as needed for nausea, vomiting. 17.Vitamin D3 daily. 18.Her clonidine has been held to prevent hypotension.  CONDITION:  Stable.  ACTIVITY:  Fall precautions.  Decubitus precautions.  Continue physical therapy for range of motion and strengthening.  Diet should be pureed with thin liquids.  Follow up with nursing home staff within 2 weeks.  CONSULTATIONS:  Speech Therapy.  PROCEDURES:  None.  CODE STATUS:  Do not resuscitate.  LABORATORY DATA:  CBC on admission was 18,900 with 88% neutrophils. Venous lactic acid was normal.  Lipase was normal.  Complete metabolic panel significant for a glucose of 154.  Potassium of 3.4.  Urinalysis showed 0-2 white cells, 7-10 red cells, negative nitrite, negative leukocyte esterase.   Hyaline casts, few bacteria.  Urine glucose greater than 1000, 40 ketones, large blood, 100 protein.  TSH was 3.083.  Normal T4.  Blood cultures negative.  At discharge, her potassium is 4.1, white blood cell count is 11,900.  Magnesium was normal.  DIAGNOSTICS AND RADIOLOGY:  Chest x-ray on admission showed minimal left basilar atelectasis.  Enlargement of cardiac silhouette.  CT of the abdomen and pelvis showed suspected stress fracture in the right sacral ala, right superior pubic ramus, right inferior pubic ramus, old inferior pubic ramus fracture, age indeterminate compression fractures at T11 and T12.  Old compression fractures at L2, L4, L5 with previous vertebroplasty.  A 4.3 cm infrarenal fusiform abdominal aortic aneurysm, patchy ground-glass opacity in the lung bases, possibly from atypical infection or low-level edema, mild cardiomegaly, cholelithiasis.  Repeat chest x-ray showed new faint infiltrate or atelectasis at the right lung base.  HISTORY AND HOSPITAL COURSE:  Please see H and P for details.  Ms. Pitz is an 77 year old black female permanent resident of the [facility] who was sent to the emergency room reportedly for a white blood cell count of 20,000.  She was found to have a pneumonia, was admitted to the hospitalist service.  She had clear lung sounds.  She was unable to give any history.  She was afebrile and had normal respirations and saturation.  She was started on Avelox and vancomycin for healthcare-associated pneumonia.  Her leukocytosis is essentially resolved.  She did have evidence of dehydration which was treated.  Her electrolyte disturbances were corrected.  She was seen by Speech Therapy who recommended pureed diet with thin liquids.  She is improved and has received maximal hospital benefit.  Her code status was do not resuscitate upon admission which will be continued.  Initially, her antihypertensives were held due to  borderline low blood pressures and they been added back slowly.  This will need to be monitored and her clonidine can be resumed if necessary.  I have spoken with her husband who agrees with the plan.  Total time on the day of discharge greater than 30 minutes.     Lysandra Loughmiller L. Lendell Caprice, MD     CLS/MEDQ  D:  08/01/2010  T:  08/01/2010  Job:  [id number]  Electronically Signed by Crista Curb MD on 08/10/2010 07:26:33 AM

## 2010-08-11 LAB — GLUCOSE, CAPILLARY
Glucose-Capillary: 181 mg/dL — ABNORMAL HIGH (ref 70–99)
Glucose-Capillary: 190 mg/dL — ABNORMAL HIGH (ref 70–99)

## 2010-08-12 LAB — GLUCOSE, CAPILLARY
Glucose-Capillary: 108 mg/dL — ABNORMAL HIGH (ref 70–99)
Glucose-Capillary: 215 mg/dL — ABNORMAL HIGH (ref 70–99)
Glucose-Capillary: 164 mg/dL — ABNORMAL HIGH (ref 70–99)

## 2010-08-14 LAB — GLUCOSE, CAPILLARY
Glucose-Capillary: 137 mg/dL — ABNORMAL HIGH (ref 70–99)
Glucose-Capillary: 149 mg/dL — ABNORMAL HIGH (ref 70–99)
Glucose-Capillary: 155 mg/dL — ABNORMAL HIGH (ref 70–99)

## 2010-08-15 LAB — GLUCOSE, CAPILLARY
Glucose-Capillary: 121 mg/dL — ABNORMAL HIGH (ref 70–99)
Glucose-Capillary: 133 mg/dL — ABNORMAL HIGH (ref 70–99)

## 2010-08-18 LAB — GLUCOSE, CAPILLARY
Glucose-Capillary: 152 mg/dL — ABNORMAL HIGH (ref 70–99)
Glucose-Capillary: 136 mg/dL — ABNORMAL HIGH (ref 70–99)

## 2010-08-19 LAB — GLUCOSE, CAPILLARY: Glucose-Capillary: 171 mg/dL — ABNORMAL HIGH (ref 70–99)

## 2010-08-20 LAB — GLUCOSE, CAPILLARY

## 2010-08-22 LAB — GLUCOSE, CAPILLARY: Glucose-Capillary: 177 mg/dL — ABNORMAL HIGH (ref 70–99)

## 2010-08-23 LAB — GLUCOSE, CAPILLARY: Glucose-Capillary: 120 mg/dL — ABNORMAL HIGH (ref 70–99)

## 2010-08-24 LAB — GLUCOSE, CAPILLARY
Glucose-Capillary: 106 mg/dL — ABNORMAL HIGH (ref 70–99)
Glucose-Capillary: 135 mg/dL — ABNORMAL HIGH (ref 70–99)

## 2010-08-28 LAB — GLUCOSE, CAPILLARY
Glucose-Capillary: 107 mg/dL — ABNORMAL HIGH (ref 70–99)
Glucose-Capillary: 179 mg/dL — ABNORMAL HIGH (ref 70–99)

## 2010-09-01 LAB — GLUCOSE, CAPILLARY: Glucose-Capillary: 155 mg/dL — ABNORMAL HIGH (ref 70–99)

## 2010-09-04 LAB — GLUCOSE, CAPILLARY: Glucose-Capillary: 113 mg/dL — ABNORMAL HIGH (ref 70–99)

## 2010-09-08 LAB — GLUCOSE, CAPILLARY
Glucose-Capillary: 116 mg/dL — ABNORMAL HIGH (ref 70–99)
Glucose-Capillary: 117 mg/dL — ABNORMAL HIGH (ref 70–99)

## 2010-09-22 LAB — GLUCOSE, CAPILLARY: Glucose-Capillary: 112 mg/dL — ABNORMAL HIGH (ref 70–99)

## 2010-09-25 LAB — GLUCOSE, CAPILLARY
Glucose-Capillary: 107 mg/dL — ABNORMAL HIGH (ref 70–99)
Glucose-Capillary: 129 mg/dL — ABNORMAL HIGH (ref 70–99)

## 2010-09-28 LAB — GLUCOSE, CAPILLARY: Glucose-Capillary: 138 mg/dL — ABNORMAL HIGH (ref 70–99)

## 2010-09-29 LAB — GLUCOSE, CAPILLARY: Glucose-Capillary: 118 mg/dL — ABNORMAL HIGH (ref 70–99)

## 2010-10-11 LAB — GLUCOSE, CAPILLARY: Glucose-Capillary: 182 mg/dL — ABNORMAL HIGH (ref 70–99)

## 2010-10-16 LAB — GLUCOSE, CAPILLARY
Glucose-Capillary: 102 mg/dL — ABNORMAL HIGH (ref 70–99)
Glucose-Capillary: 126 mg/dL — ABNORMAL HIGH (ref 70–99)

## 2010-10-20 LAB — GLUCOSE, CAPILLARY: Glucose-Capillary: 108 mg/dL — ABNORMAL HIGH (ref 70–99)

## 2010-10-24 LAB — GLUCOSE, CAPILLARY

## 2010-10-27 LAB — GLUCOSE, CAPILLARY: Glucose-Capillary: 116 mg/dL — ABNORMAL HIGH (ref 70–99)

## 2010-10-30 LAB — GLUCOSE, CAPILLARY
Glucose-Capillary: 98 mg/dL (ref 70–99)
Glucose-Capillary: 181 mg/dL — ABNORMAL HIGH (ref 70–99)

## 2010-11-06 LAB — GLUCOSE, CAPILLARY: Glucose-Capillary: 111 mg/dL — ABNORMAL HIGH (ref 70–99)

## 2010-11-12 LAB — GLUCOSE, CAPILLARY: Glucose-Capillary: 102 mg/dL — ABNORMAL HIGH (ref 70–99)

## 2010-11-13 LAB — GLUCOSE, CAPILLARY: Glucose-Capillary: 205 mg/dL — ABNORMAL HIGH (ref 70–99)

## 2010-11-15 LAB — GLUCOSE, CAPILLARY: Glucose-Capillary: 172 mg/dL — ABNORMAL HIGH (ref 70–99)

## 2010-11-19 LAB — GLUCOSE, CAPILLARY: Glucose-Capillary: 96 mg/dL (ref 70–99)

## 2010-11-24 LAB — GLUCOSE, CAPILLARY
Glucose-Capillary: 109 mg/dL — ABNORMAL HIGH (ref 70–99)
Glucose-Capillary: 133 mg/dL — ABNORMAL HIGH (ref 70–99)

## 2010-11-27 LAB — GLUCOSE, CAPILLARY: Glucose-Capillary: 103 mg/dL — ABNORMAL HIGH (ref 70–99)

## 2010-12-01 LAB — GLUCOSE, CAPILLARY: Glucose-Capillary: 105 mg/dL — ABNORMAL HIGH (ref 70–99)

## 2010-12-04 LAB — GLUCOSE, CAPILLARY
Glucose-Capillary: 105 mg/dL — ABNORMAL HIGH (ref 70–99)
Glucose-Capillary: 108 mg/dL — ABNORMAL HIGH (ref 70–99)
Glucose-Capillary: 123 mg/dL — ABNORMAL HIGH (ref 70–99)
Glucose-Capillary: 133 mg/dL — ABNORMAL HIGH (ref 70–99)
Glucose-Capillary: 86 mg/dL (ref 70–99)
Glucose-Capillary: 93 mg/dL (ref 70–99)
Glucose-Capillary: 95 mg/dL (ref 70–99)
Glucose-Capillary: 164 mg/dL — ABNORMAL HIGH (ref 70–99)

## 2010-12-05 LAB — GLUCOSE, CAPILLARY
Glucose-Capillary: 102 mg/dL — ABNORMAL HIGH (ref 70–99)
Glucose-Capillary: 102 mg/dL — ABNORMAL HIGH (ref 70–99)
Glucose-Capillary: 104 mg/dL — ABNORMAL HIGH (ref 70–99)
Glucose-Capillary: 108 mg/dL — ABNORMAL HIGH (ref 70–99)
Glucose-Capillary: 111 mg/dL — ABNORMAL HIGH (ref 70–99)
Glucose-Capillary: 111 mg/dL — ABNORMAL HIGH (ref 70–99)
Glucose-Capillary: 113 mg/dL — ABNORMAL HIGH (ref 70–99)
Glucose-Capillary: 114 mg/dL — ABNORMAL HIGH (ref 70–99)
Glucose-Capillary: 115 mg/dL — ABNORMAL HIGH (ref 70–99)
Glucose-Capillary: 119 mg/dL — ABNORMAL HIGH (ref 70–99)
Glucose-Capillary: 83 mg/dL (ref 70–99)
Glucose-Capillary: 85 mg/dL (ref 70–99)
Glucose-Capillary: 86 mg/dL (ref 70–99)
Glucose-Capillary: 91 mg/dL (ref 70–99)
Glucose-Capillary: 94 mg/dL (ref 70–99)
Glucose-Capillary: 111 mg/dL — ABNORMAL HIGH (ref 70–99)
Glucose-Capillary: 111 mg/dL — ABNORMAL HIGH (ref 70–99)
Glucose-Capillary: 116 mg/dL — ABNORMAL HIGH (ref 70–99)
Glucose-Capillary: 122 mg/dL — ABNORMAL HIGH (ref 70–99)
Glucose-Capillary: 76 mg/dL (ref 70–99)
Glucose-Capillary: 103 mg/dL — ABNORMAL HIGH (ref 70–99)
Glucose-Capillary: 113 mg/dL — ABNORMAL HIGH (ref 70–99)
Glucose-Capillary: 118 mg/dL — ABNORMAL HIGH (ref 70–99)
Glucose-Capillary: 118 mg/dL — ABNORMAL HIGH (ref 70–99)
Glucose-Capillary: 125 mg/dL — ABNORMAL HIGH (ref 70–99)
Glucose-Capillary: 70 mg/dL (ref 70–99)
Glucose-Capillary: 82 mg/dL (ref 70–99)
Glucose-Capillary: 98 mg/dL (ref 70–99)

## 2010-12-07 LAB — GLUCOSE, CAPILLARY
Glucose-Capillary: 100 mg/dL — ABNORMAL HIGH (ref 70–99)
Glucose-Capillary: 105 mg/dL — ABNORMAL HIGH (ref 70–99)
Glucose-Capillary: 106 mg/dL — ABNORMAL HIGH (ref 70–99)
Glucose-Capillary: 107 mg/dL — ABNORMAL HIGH (ref 70–99)
Glucose-Capillary: 110 mg/dL — ABNORMAL HIGH (ref 70–99)
Glucose-Capillary: 121 mg/dL — ABNORMAL HIGH (ref 70–99)
Glucose-Capillary: 121 mg/dL — ABNORMAL HIGH (ref 70–99)
Glucose-Capillary: 135 mg/dL — ABNORMAL HIGH (ref 70–99)
Glucose-Capillary: 67 mg/dL — ABNORMAL LOW (ref 70–99)
Glucose-Capillary: 72 mg/dL (ref 70–99)
Glucose-Capillary: 88 mg/dL (ref 70–99)
Glucose-Capillary: 93 mg/dL (ref 70–99)
Glucose-Capillary: 95 mg/dL (ref 70–99)
Glucose-Capillary: 97 mg/dL (ref 70–99)
Glucose-Capillary: 98 mg/dL (ref 70–99)

## 2010-12-08 LAB — GLUCOSE, CAPILLARY
Glucose-Capillary: 103 mg/dL — ABNORMAL HIGH (ref 70–99)
Glucose-Capillary: 122 mg/dL — ABNORMAL HIGH (ref 70–99)

## 2010-12-11 LAB — GLUCOSE, CAPILLARY
Glucose-Capillary: 114 mg/dL — ABNORMAL HIGH (ref 70–99)
Glucose-Capillary: 153 mg/dL — ABNORMAL HIGH (ref 70–99)

## 2010-12-15 LAB — GLUCOSE, CAPILLARY: Glucose-Capillary: 183 mg/dL — ABNORMAL HIGH (ref 70–99)

## 2010-12-18 LAB — GLUCOSE, CAPILLARY: Glucose-Capillary: 101 mg/dL — ABNORMAL HIGH (ref 70–99)

## 2010-12-25 LAB — GLUCOSE, CAPILLARY: Glucose-Capillary: 104 mg/dL — ABNORMAL HIGH (ref 70–99)

## 2010-12-29 LAB — GLUCOSE, CAPILLARY
Glucose-Capillary: 109 mg/dL — ABNORMAL HIGH (ref 70–99)
Glucose-Capillary: 151 mg/dL — ABNORMAL HIGH (ref 70–99)

## 2011-01-01 LAB — GLUCOSE, CAPILLARY: Glucose-Capillary: 115 mg/dL — ABNORMAL HIGH (ref 70–99)

## 2011-01-05 LAB — GLUCOSE, CAPILLARY

## 2011-01-12 LAB — GLUCOSE, CAPILLARY: Glucose-Capillary: 113 mg/dL — ABNORMAL HIGH (ref 70–99)

## 2011-01-15 LAB — GLUCOSE, CAPILLARY
Glucose-Capillary: 119 mg/dL — ABNORMAL HIGH (ref 70–99)
Glucose-Capillary: 181 mg/dL — ABNORMAL HIGH (ref 70–99)

## 2011-01-19 LAB — GLUCOSE, CAPILLARY: Glucose-Capillary: 105 mg/dL — ABNORMAL HIGH (ref 70–99)

## 2011-01-22 LAB — GLUCOSE, CAPILLARY: Glucose-Capillary: 117 mg/dL — ABNORMAL HIGH (ref 70–99)

## 2011-01-26 LAB — GLUCOSE, CAPILLARY: Glucose-Capillary: 126 mg/dL — ABNORMAL HIGH (ref 70–99)

## 2011-01-29 LAB — GLUCOSE, CAPILLARY

## 2011-02-02 LAB — GLUCOSE, CAPILLARY: Glucose-Capillary: 121 mg/dL — ABNORMAL HIGH (ref 70–99)

## 2011-02-05 LAB — GLUCOSE, CAPILLARY
Glucose-Capillary: 114 mg/dL — ABNORMAL HIGH (ref 70–99)
Glucose-Capillary: 138 mg/dL — ABNORMAL HIGH (ref 70–99)

## 2011-02-09 LAB — GLUCOSE, CAPILLARY: Glucose-Capillary: 124 mg/dL — ABNORMAL HIGH (ref 70–99)

## 2011-02-19 LAB — GLUCOSE, CAPILLARY: Glucose-Capillary: 105 mg/dL — ABNORMAL HIGH (ref 70–99)

## 2011-02-23 LAB — GLUCOSE, CAPILLARY: Glucose-Capillary: 108 mg/dL — ABNORMAL HIGH (ref 70–99)

## 2011-02-26 LAB — GLUCOSE, CAPILLARY
Glucose-Capillary: 110 mg/dL — ABNORMAL HIGH (ref 70–99)
Glucose-Capillary: 95 mg/dL (ref 70–99)

## 2011-03-05 LAB — GLUCOSE, CAPILLARY: Glucose-Capillary: 148 mg/dL — ABNORMAL HIGH (ref 70–99)

## 2011-03-09 LAB — GLUCOSE, CAPILLARY: Glucose-Capillary: 139 mg/dL — ABNORMAL HIGH (ref 70–99)

## 2011-03-12 LAB — GLUCOSE, CAPILLARY: Glucose-Capillary: 112 mg/dL — ABNORMAL HIGH (ref 70–99)

## 2011-03-19 LAB — GLUCOSE, CAPILLARY: Glucose-Capillary: 157 mg/dL — ABNORMAL HIGH (ref 70–99)

## 2011-03-23 LAB — GLUCOSE, CAPILLARY
Glucose-Capillary: 102 mg/dL — ABNORMAL HIGH (ref 70–99)
Glucose-Capillary: 124 mg/dL — ABNORMAL HIGH (ref 70–99)

## 2011-03-24 LAB — GLUCOSE, CAPILLARY: Glucose-Capillary: 108 mg/dL — ABNORMAL HIGH (ref 70–99)

## 2011-03-26 LAB — GLUCOSE, CAPILLARY: Glucose-Capillary: 107 mg/dL — ABNORMAL HIGH (ref 70–99)

## 2011-03-30 LAB — GLUCOSE, CAPILLARY: Glucose-Capillary: 168 mg/dL — ABNORMAL HIGH (ref 70–99)

## 2011-04-02 LAB — GLUCOSE, CAPILLARY: Glucose-Capillary: 123 mg/dL — ABNORMAL HIGH (ref 70–99)

## 2011-04-06 LAB — GLUCOSE, CAPILLARY
Glucose-Capillary: 114 mg/dL — ABNORMAL HIGH (ref 70–99)
Glucose-Capillary: 123 mg/dL — ABNORMAL HIGH (ref 70–99)

## 2011-04-13 LAB — GLUCOSE, CAPILLARY: Glucose-Capillary: 116 mg/dL — ABNORMAL HIGH (ref 70–99)

## 2011-04-14 LAB — GLUCOSE, CAPILLARY: Glucose-Capillary: 128 mg/dL — ABNORMAL HIGH (ref 70–99)

## 2011-04-20 LAB — GLUCOSE, CAPILLARY: Glucose-Capillary: 135 mg/dL — ABNORMAL HIGH (ref 70–99)

## 2011-04-23 LAB — GLUCOSE, CAPILLARY: Glucose-Capillary: 119 mg/dL — ABNORMAL HIGH (ref 70–99)

## 2011-04-27 LAB — GLUCOSE, CAPILLARY: Glucose-Capillary: 145 mg/dL — ABNORMAL HIGH (ref 70–99)

## 2011-04-30 LAB — GLUCOSE, CAPILLARY: Glucose-Capillary: 106 mg/dL — ABNORMAL HIGH (ref 70–99)

## 2011-05-04 LAB — GLUCOSE, CAPILLARY: Glucose-Capillary: 158 mg/dL — ABNORMAL HIGH (ref 70–99)

## 2011-05-11 LAB — GLUCOSE, CAPILLARY

## 2011-05-14 LAB — GLUCOSE, CAPILLARY: Glucose-Capillary: 147 mg/dL — ABNORMAL HIGH (ref 70–99)

## 2011-05-21 LAB — GLUCOSE, CAPILLARY: Glucose-Capillary: 90 mg/dL (ref 70–99)

## 2011-05-25 LAB — GLUCOSE, CAPILLARY: Glucose-Capillary: 87 mg/dL (ref 70–99)

## 2011-05-28 LAB — GLUCOSE, CAPILLARY: Glucose-Capillary: 100 mg/dL — ABNORMAL HIGH (ref 70–99)

## 2011-06-04 LAB — GLUCOSE, CAPILLARY
Glucose-Capillary: 111 mg/dL — ABNORMAL HIGH (ref 70–99)
Glucose-Capillary: 137 mg/dL — ABNORMAL HIGH (ref 70–99)

## 2011-06-08 LAB — GLUCOSE, CAPILLARY
Glucose-Capillary: 107 mg/dL — ABNORMAL HIGH (ref 70–99)
Glucose-Capillary: 182 mg/dL — ABNORMAL HIGH (ref 70–99)

## 2011-06-11 LAB — GLUCOSE, CAPILLARY: Glucose-Capillary: 87 mg/dL (ref 70–99)

## 2011-06-15 LAB — GLUCOSE, CAPILLARY
Glucose-Capillary: 98 mg/dL (ref 70–99)
Glucose-Capillary: 167 mg/dL — ABNORMAL HIGH (ref 70–99)

## 2011-06-18 LAB — GLUCOSE, CAPILLARY: Glucose-Capillary: 85 mg/dL (ref 70–99)

## 2011-06-29 LAB — GLUCOSE, CAPILLARY
Glucose-Capillary: 104 mg/dL — ABNORMAL HIGH (ref 70–99)
Glucose-Capillary: 146 mg/dL — ABNORMAL HIGH (ref 70–99)

## 2011-07-02 LAB — GLUCOSE, CAPILLARY
Glucose-Capillary: 103 mg/dL — ABNORMAL HIGH (ref 70–99)
Glucose-Capillary: 151 mg/dL — ABNORMAL HIGH (ref 70–99)

## 2011-07-03 LAB — GLUCOSE, CAPILLARY: Glucose-Capillary: 260 mg/dL — ABNORMAL HIGH (ref 70–99)

## 2011-07-06 LAB — GLUCOSE, CAPILLARY: Glucose-Capillary: 80 mg/dL (ref 70–99)

## 2011-07-09 ENCOUNTER — Ambulatory Visit (HOSPITAL_COMMUNITY)
Admit: 2011-07-09 | Discharge: 2011-07-09 | Disposition: A | Discharge status: Home / Self Care | Payer: Medicare Other | Attending: Internal Medicine | Admitting: Internal Medicine

## 2011-07-09 DIAGNOSIS — I714 Abdominal aortic aneurysm, without rupture, unspecified: Secondary | ICD-10-CM | POA: Insufficient documentation

## 2011-07-12 ENCOUNTER — Ambulatory Visit (HOSPITAL_COMMUNITY)
Admit: 2011-07-12 | Discharge: 2011-07-12 | Disposition: A | Discharge status: Skilled Nursing Facility | Payer: Medicare Other | Source: Skilled Nursing Facility | Attending: Internal Medicine | Admitting: Internal Medicine

## 2011-07-12 DIAGNOSIS — I714 Abdominal aortic aneurysm, without rupture, unspecified: Secondary | ICD-10-CM | POA: Insufficient documentation

## 2011-07-12 DIAGNOSIS — I722 Aneurysm of renal artery: Secondary | ICD-10-CM | POA: Insufficient documentation

## 2011-07-12 DIAGNOSIS — K8020 Calculus of gallbladder without cholecystitis without obstruction: Secondary | ICD-10-CM | POA: Insufficient documentation

## 2011-07-16 LAB — GLUCOSE, CAPILLARY
Glucose-Capillary: 90 mg/dL (ref 70–99)
Glucose-Capillary: 218 mg/dL — ABNORMAL HIGH (ref 70–99)

## 2011-07-20 LAB — GLUCOSE, CAPILLARY: Glucose-Capillary: 94 mg/dL (ref 70–99)

## 2011-07-23 LAB — GLUCOSE, CAPILLARY: Glucose-Capillary: 156 mg/dL — ABNORMAL HIGH (ref 70–99)

## 2011-07-30 LAB — GLUCOSE, CAPILLARY
Glucose-Capillary: 109 mg/dL — ABNORMAL HIGH (ref 70–99)
Glucose-Capillary: 131 mg/dL — ABNORMAL HIGH (ref 70–99)

## 2011-08-02 LAB — GLUCOSE, CAPILLARY: Glucose-Capillary: 195 mg/dL — ABNORMAL HIGH (ref 70–99)

## 2011-08-03 LAB — GLUCOSE, CAPILLARY: Glucose-Capillary: 130 mg/dL — ABNORMAL HIGH (ref 70–99)

## 2011-08-06 LAB — GLUCOSE, CAPILLARY: Glucose-Capillary: 94 mg/dL (ref 70–99)

## 2011-08-10 LAB — GLUCOSE, CAPILLARY
Glucose-Capillary: 98 mg/dL (ref 70–99)
Glucose-Capillary: 124 mg/dL — ABNORMAL HIGH (ref 70–99)

## 2011-08-13 LAB — GLUCOSE, CAPILLARY: Glucose-Capillary: 152 mg/dL — ABNORMAL HIGH (ref 70–99)

## 2011-08-17 LAB — GLUCOSE, CAPILLARY: Glucose-Capillary: 100 mg/dL — ABNORMAL HIGH (ref 70–99)

## 2011-08-20 LAB — GLUCOSE, CAPILLARY: Glucose-Capillary: 145 mg/dL — ABNORMAL HIGH (ref 70–99)

## 2011-08-31 LAB — GLUCOSE, CAPILLARY
Glucose-Capillary: 84 mg/dL (ref 70–99)
Glucose-Capillary: 162 mg/dL — ABNORMAL HIGH (ref 70–99)

## 2011-09-03 LAB — GLUCOSE, CAPILLARY: Glucose-Capillary: 93 mg/dL (ref 70–99)

## 2011-09-07 LAB — GLUCOSE, CAPILLARY: Glucose-Capillary: 157 mg/dL — ABNORMAL HIGH (ref 70–99)

## 2011-09-10 LAB — GLUCOSE, CAPILLARY: Glucose-Capillary: 92 mg/dL (ref 70–99)

## 2011-09-17 LAB — GLUCOSE, CAPILLARY
Glucose-Capillary: 110 mg/dL — ABNORMAL HIGH (ref 70–99)
Glucose-Capillary: 116 mg/dL — ABNORMAL HIGH (ref 70–99)

## 2011-09-21 LAB — GLUCOSE, CAPILLARY
Glucose-Capillary: 93 mg/dL (ref 70–99)
Glucose-Capillary: 149 mg/dL — ABNORMAL HIGH (ref 70–99)

## 2011-09-24 LAB — GLUCOSE, CAPILLARY: Glucose-Capillary: 159 mg/dL — ABNORMAL HIGH (ref 70–99)

## 2011-10-01 LAB — GLUCOSE, CAPILLARY: Glucose-Capillary: 96 mg/dL (ref 70–99)

## 2011-10-02 LAB — GLUCOSE, CAPILLARY: Glucose-Capillary: 227 mg/dL — ABNORMAL HIGH (ref 70–99)

## 2011-10-06 LAB — GLUCOSE, CAPILLARY: Glucose-Capillary: 85 mg/dL (ref 70–99)

## 2011-10-08 LAB — GLUCOSE, CAPILLARY: Glucose-Capillary: 88 mg/dL (ref 70–99)

## 2011-10-15 LAB — GLUCOSE, CAPILLARY: Glucose-Capillary: 100 mg/dL — ABNORMAL HIGH (ref 70–99)

## 2011-10-19 LAB — GLUCOSE, CAPILLARY: Glucose-Capillary: 143 mg/dL — ABNORMAL HIGH (ref 70–99)

## 2011-10-26 LAB — GLUCOSE, CAPILLARY
Glucose-Capillary: 100 mg/dL — ABNORMAL HIGH (ref 70–99)
Glucose-Capillary: 102 mg/dL — ABNORMAL HIGH (ref 70–99)

## 2011-10-29 LAB — GLUCOSE, CAPILLARY: Glucose-Capillary: 131 mg/dL — ABNORMAL HIGH (ref 70–99)

## 2011-11-02 LAB — GLUCOSE, CAPILLARY: Glucose-Capillary: 189 mg/dL — ABNORMAL HIGH (ref 70–99)

## 2011-11-05 LAB — GLUCOSE, CAPILLARY
Glucose-Capillary: 93 mg/dL (ref 70–99)
Glucose-Capillary: 154 mg/dL — ABNORMAL HIGH (ref 70–99)

## 2011-11-09 LAB — GLUCOSE, CAPILLARY: Glucose-Capillary: 92 mg/dL (ref 70–99)

## 2011-11-12 LAB — GLUCOSE, CAPILLARY
Glucose-Capillary: 80 mg/dL (ref 70–99)
Glucose-Capillary: 159 mg/dL — ABNORMAL HIGH (ref 70–99)

## 2011-11-19 LAB — GLUCOSE, CAPILLARY
Glucose-Capillary: 91 mg/dL (ref 70–99)
Glucose-Capillary: 128 mg/dL — ABNORMAL HIGH (ref 70–99)

## 2011-11-23 LAB — GLUCOSE, CAPILLARY: Glucose-Capillary: 91 mg/dL (ref 70–99)

## 2011-11-26 LAB — GLUCOSE, CAPILLARY: Glucose-Capillary: 94 mg/dL (ref 70–99)

## 2011-12-07 LAB — GLUCOSE, CAPILLARY

## 2011-12-10 LAB — GLUCOSE, CAPILLARY: Glucose-Capillary: 109 mg/dL — ABNORMAL HIGH (ref 70–99)

## 2011-12-12 LAB — GLUCOSE, CAPILLARY

## 2011-12-14 LAB — GLUCOSE, CAPILLARY: Glucose-Capillary: 92 mg/dL (ref 70–99)

## 2011-12-17 LAB — GLUCOSE, CAPILLARY
Glucose-Capillary: 104 mg/dL — ABNORMAL HIGH (ref 70–99)
Glucose-Capillary: 264 mg/dL — ABNORMAL HIGH (ref 70–99)

## 2011-12-21 LAB — GLUCOSE, CAPILLARY

## 2011-12-24 LAB — GLUCOSE, CAPILLARY: Glucose-Capillary: 98 mg/dL (ref 70–99)

## 2011-12-28 LAB — GLUCOSE, CAPILLARY
Glucose-Capillary: 83 mg/dL (ref 70–99)
Glucose-Capillary: 155 mg/dL — ABNORMAL HIGH (ref 70–99)

## 2011-12-31 LAB — GLUCOSE, CAPILLARY: Glucose-Capillary: 163 mg/dL — ABNORMAL HIGH (ref 70–99)

## 2012-01-05 LAB — GLUCOSE, CAPILLARY: Glucose-Capillary: 160 mg/dL — ABNORMAL HIGH (ref 70–99)

## 2012-01-07 LAB — GLUCOSE, CAPILLARY: Glucose-Capillary: 98 mg/dL (ref 70–99)

## 2012-01-08 LAB — GLUCOSE, CAPILLARY: Glucose-Capillary: 125 mg/dL — ABNORMAL HIGH (ref 70–99)

## 2012-01-11 LAB — GLUCOSE, CAPILLARY: Glucose-Capillary: 90 mg/dL (ref 70–99)

## 2012-01-18 LAB — GLUCOSE, CAPILLARY

## 2012-01-25 LAB — GLUCOSE, CAPILLARY: Glucose-Capillary: 103 mg/dL — ABNORMAL HIGH (ref 70–99)

## 2012-01-28 LAB — GLUCOSE, CAPILLARY: Glucose-Capillary: 86 mg/dL (ref 70–99)

## 2012-01-29 LAB — GLUCOSE, CAPILLARY: Glucose-Capillary: 161 mg/dL — ABNORMAL HIGH (ref 70–99)

## 2012-02-08 LAB — GLUCOSE, CAPILLARY
Glucose-Capillary: 95 mg/dL (ref 70–99)
Glucose-Capillary: 161 mg/dL — ABNORMAL HIGH (ref 70–99)

## 2012-02-11 LAB — GLUCOSE, CAPILLARY
Glucose-Capillary: 94 mg/dL (ref 70–99)
Glucose-Capillary: 197 mg/dL — ABNORMAL HIGH (ref 70–99)

## 2012-02-22 LAB — GLUCOSE, CAPILLARY: Glucose-Capillary: 87 mg/dL (ref 70–99)

## 2012-02-25 LAB — GLUCOSE, CAPILLARY
Glucose-Capillary: 182 mg/dL — ABNORMAL HIGH (ref 70–99)
Glucose-Capillary: 102 mg/dL — ABNORMAL HIGH (ref 70–99)

## 2012-02-26 LAB — GLUCOSE, CAPILLARY: Glucose-Capillary: 176 mg/dL — ABNORMAL HIGH (ref 70–99)

## 2012-02-29 LAB — GLUCOSE, CAPILLARY: Glucose-Capillary: 100 mg/dL — ABNORMAL HIGH (ref 70–99)

## 2012-03-04 LAB — GLUCOSE, CAPILLARY

## 2012-03-08 LAB — GLUCOSE, CAPILLARY: Glucose-Capillary: 143 mg/dL — ABNORMAL HIGH (ref 70–99)

## 2012-03-11 LAB — GLUCOSE, CAPILLARY: Glucose-Capillary: 94 mg/dL (ref 70–99)

## 2012-03-17 LAB — GLUCOSE, CAPILLARY
Glucose-Capillary: 100 mg/dL — ABNORMAL HIGH (ref 70–99)
Glucose-Capillary: 158 mg/dL — ABNORMAL HIGH (ref 70–99)

## 2012-03-21 LAB — GLUCOSE, CAPILLARY: Glucose-Capillary: 88 mg/dL (ref 70–99)

## 2012-03-24 LAB — GLUCOSE, CAPILLARY: Glucose-Capillary: 185 mg/dL — ABNORMAL HIGH (ref 70–99)

## 2012-03-28 LAB — GLUCOSE, CAPILLARY: Glucose-Capillary: 102 mg/dL — ABNORMAL HIGH (ref 70–99)

## 2012-03-31 LAB — GLUCOSE, CAPILLARY
Glucose-Capillary: 98 mg/dL (ref 70–99)
Glucose-Capillary: 79 mg/dL (ref 70–99)

## 2012-04-04 LAB — GLUCOSE, CAPILLARY: Glucose-Capillary: 103 mg/dL — ABNORMAL HIGH (ref 70–99)

## 2012-04-11 LAB — GLUCOSE, CAPILLARY: Glucose-Capillary: 131 mg/dL — ABNORMAL HIGH (ref 70–99)

## 2012-04-14 LAB — GLUCOSE, CAPILLARY
Glucose-Capillary: 98 mg/dL (ref 70–99)
Glucose-Capillary: 153 mg/dL — ABNORMAL HIGH (ref 70–99)

## 2012-04-18 LAB — GLUCOSE, CAPILLARY: Glucose-Capillary: 91 mg/dL (ref 70–99)

## 2012-04-21 LAB — GLUCOSE, CAPILLARY: Glucose-Capillary: 155 mg/dL — ABNORMAL HIGH (ref 70–99)

## 2012-04-25 LAB — GLUCOSE, CAPILLARY: Glucose-Capillary: 88 mg/dL (ref 70–99)

## 2012-05-02 LAB — GLUCOSE, CAPILLARY
Glucose-Capillary: 87 mg/dL (ref 70–99)
Glucose-Capillary: 165 mg/dL — ABNORMAL HIGH (ref 70–99)

## 2012-05-05 LAB — GLUCOSE, CAPILLARY
Glucose-Capillary: 106 mg/dL — ABNORMAL HIGH (ref 70–99)
Glucose-Capillary: 196 mg/dL — ABNORMAL HIGH (ref 70–99)

## 2012-05-09 LAB — GLUCOSE, CAPILLARY: Glucose-Capillary: 115 mg/dL — ABNORMAL HIGH (ref 70–99)

## 2012-05-16 LAB — GLUCOSE, CAPILLARY: Glucose-Capillary: 166 mg/dL — ABNORMAL HIGH (ref 70–99)

## 2012-05-23 LAB — GLUCOSE, CAPILLARY
Glucose-Capillary: 85 mg/dL (ref 70–99)
Glucose-Capillary: 143 mg/dL — ABNORMAL HIGH (ref 70–99)

## 2012-05-26 LAB — GLUCOSE, CAPILLARY: Glucose-Capillary: 86 mg/dL (ref 70–99)

## 2012-05-27 IMAGING — CT CT ABDOMEN W/O CM
2 of 4 series · 17 of 46 positions shown, 19 images · non-contrast
Comparison: 07/28/2010.

CLINICAL DATA: Evaluate abdominal aortic aneurysm.

CT ABDOMEN WITHOUT CONTRAST
TECHNIQUE: Multidetector CT imaging of the abdomen was performed
following the standard protocol without IV contrast.

[Series 2: abdomen/pelvis w/o contrast · axial · non-contrast · 0.55mm/px · z∈[-233,-33]mm · 14 of 48 slices shown, 16 images]
[im 4/48  soft-tissue]
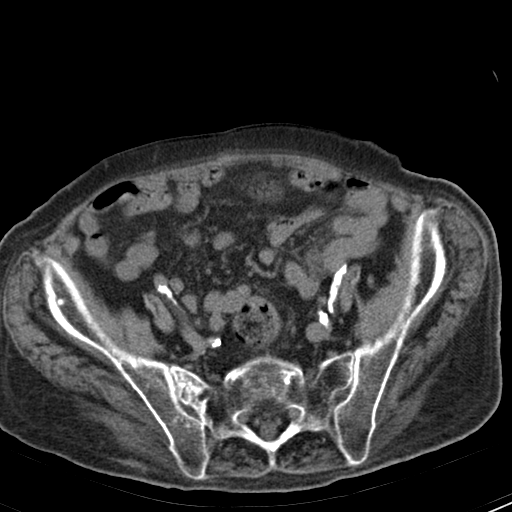
[im 4/48  bone]
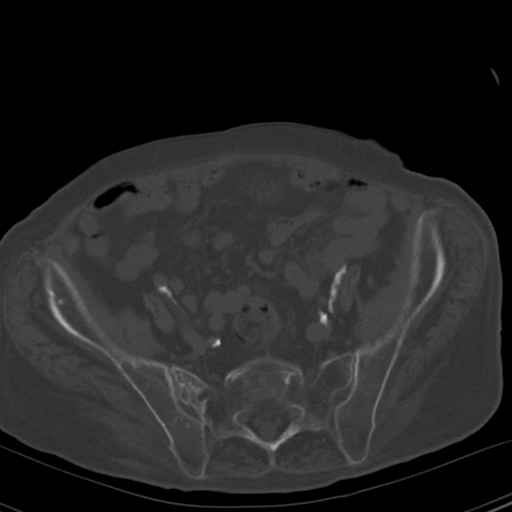
[im 7/48  soft-tissue]
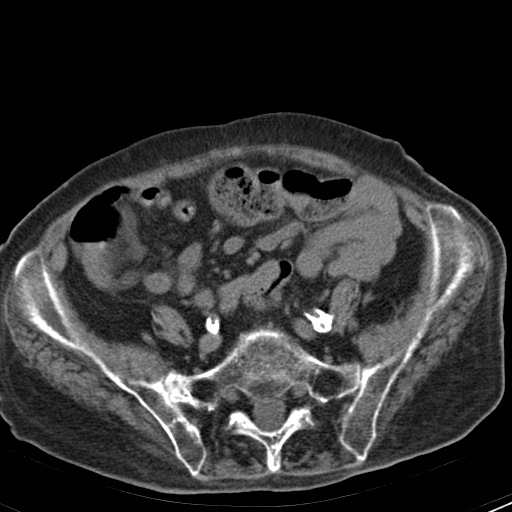
[im 10/48  soft-tissue]
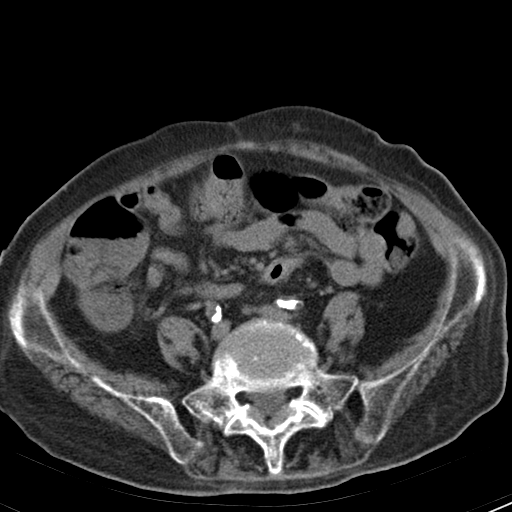
[im 13/48  soft-tissue]
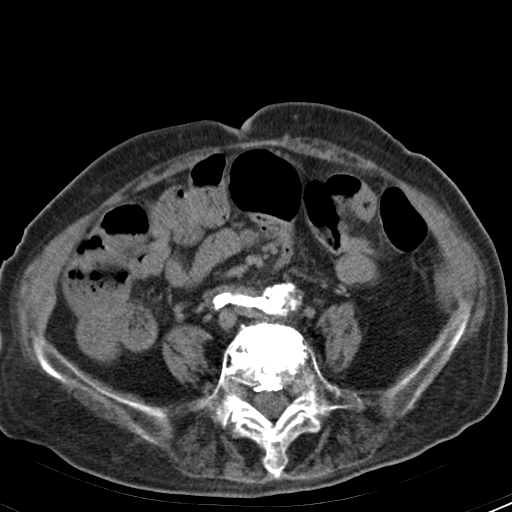
[im 16/48  soft-tissue]
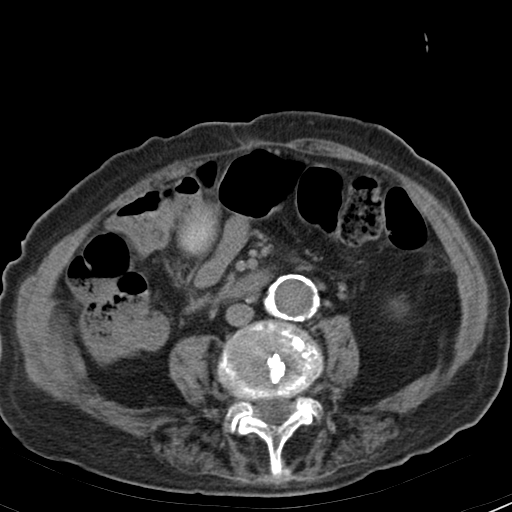
[im 19/48  soft-tissue]
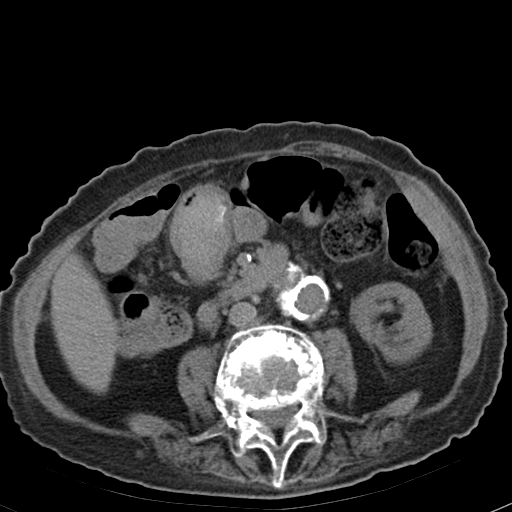
[im 22/48  soft-tissue]
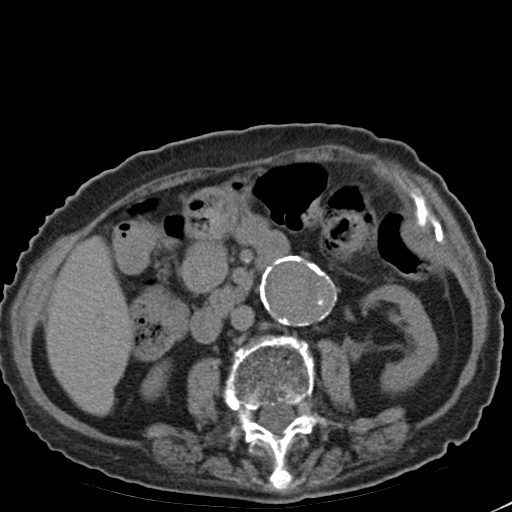
[im 26/48  soft-tissue]
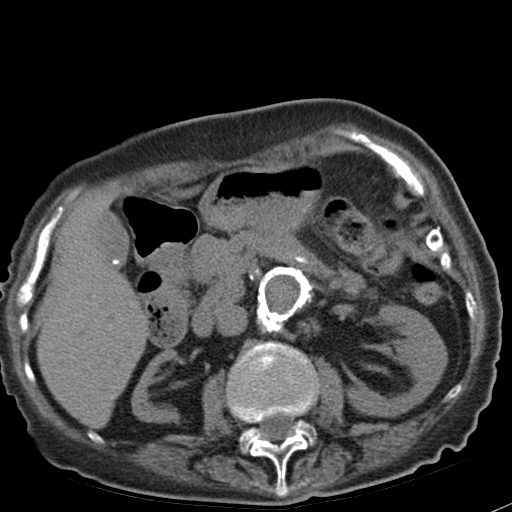
[im 29/48  soft-tissue]
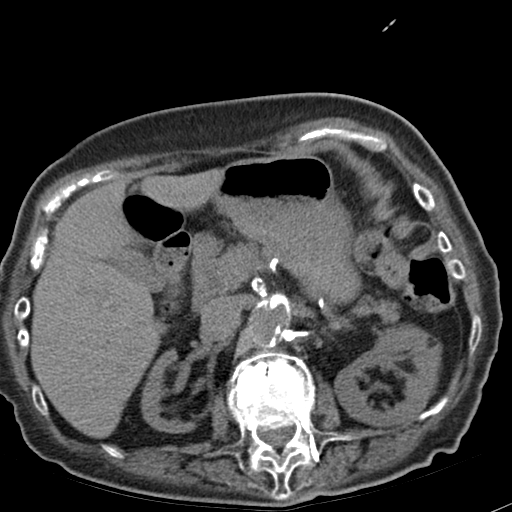
[im 29/48  bone]
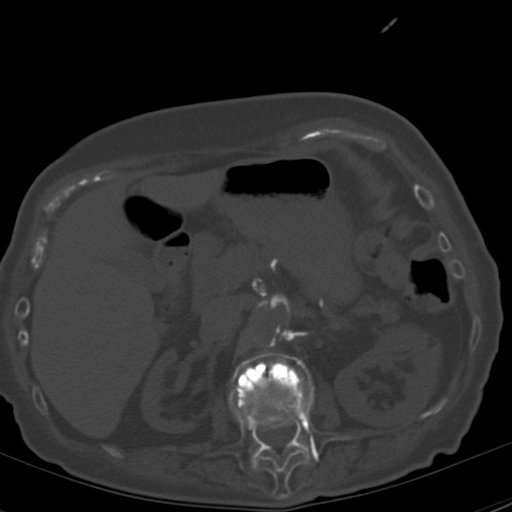
[im 32/48  soft-tissue]
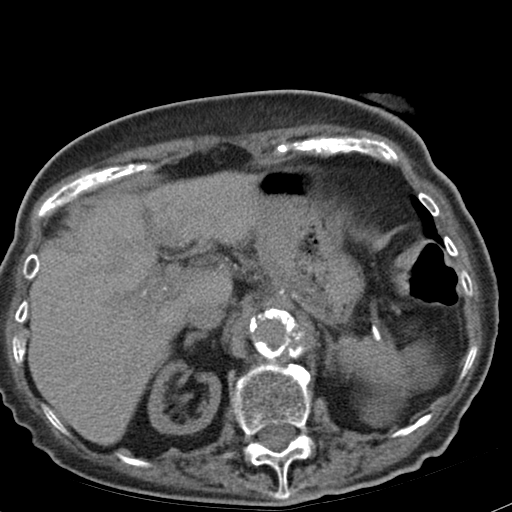
[im 35/48  soft-tissue]
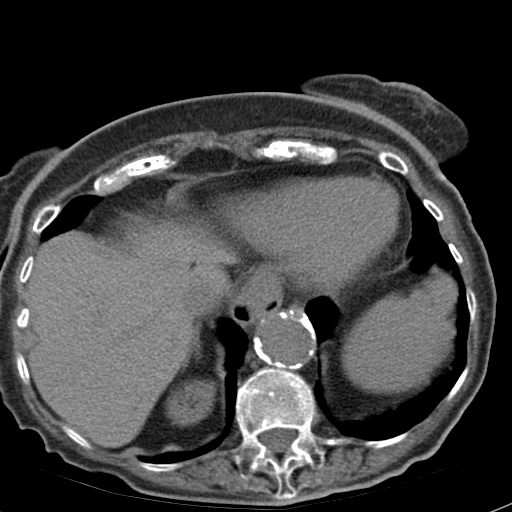
[im 38/48  soft-tissue]
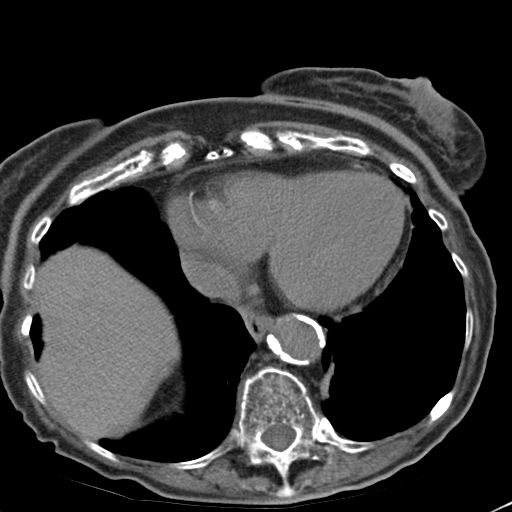
[im 41/48  soft-tissue]
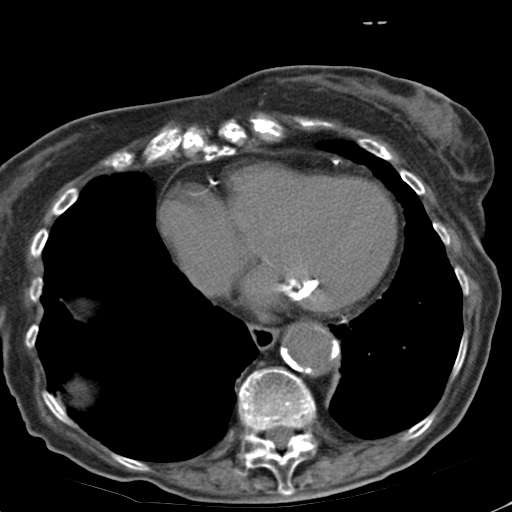
[im 44/48  soft-tissue]
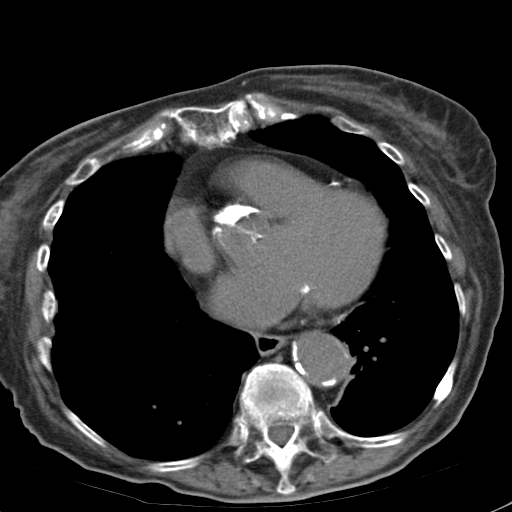

[Series 4: mpr cor (id) · coronal · 0.50mm/px · 3 of 81 slices shown]
[im 27/81  soft-tissue]
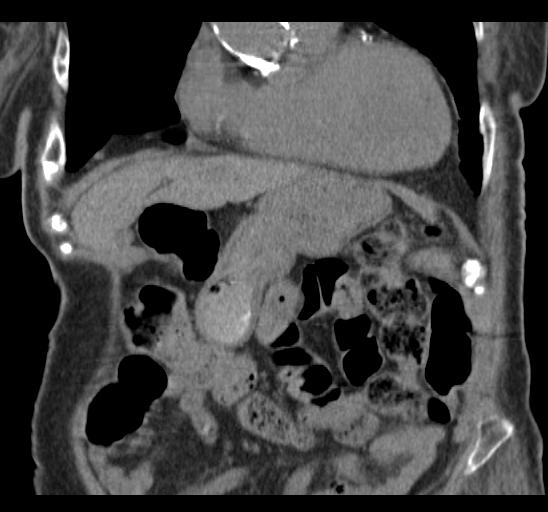
[im 36/81  soft-tissue]
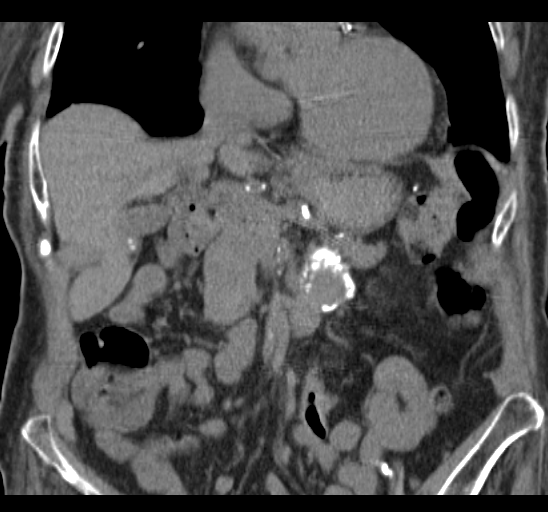
[im 45/81  soft-tissue]
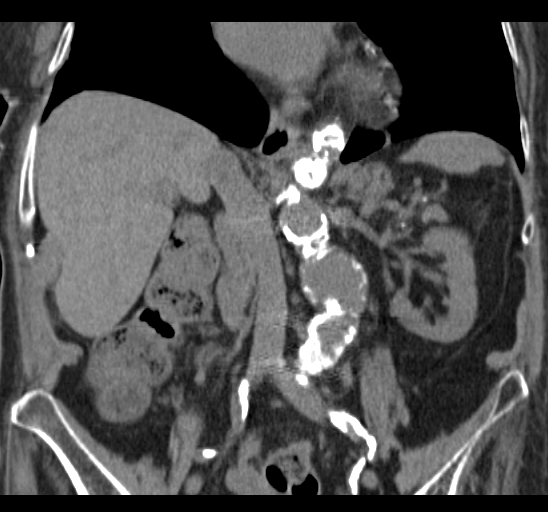

[17 of 46 positions shown; findings below may reference images not displayed]

FINDINGS: Lung bases show mild scattered scarring.  Heart is
mildly enlarged.  Extensive coronary artery calcification.  No
pericardial or pleural effusion.

Liver is unremarkable.  Small stones are seen in the gallbladder.
Adrenal glands are unremarkable.  Right kidney is atrophic.  There
may be a faint tiny stone in the lower pole left kidney.  A 6 mm
fat density lesion is seen in the upper pole left kidney.  Spleen
and pancreas unremarkable.  Small hiatal hernia.  Stomach and
visualized bowel are otherwise unremarkable.

Extensive atherosclerotic calcification of the arterial
vasculature.  Infrarenal aortic aneurysm measures 3.8 x 4.2 cm,
stable.  No pathologically enlarged lymph nodes.  No free fluid.

Osteopenia. Multiple compression fractures and vertebroplasties are
seen.  No worrisome lytic or sclerotic lesions.  Old bilateral rib
fractures.
IMPRESSION: 1.  Infrarenal aortic aneurysm, stable.
2.  Extensive atherosclerotic calcification of the arterial
vasculature.
3.  Cholelithiasis.
4.  Left renal angiomyolipoma.

## 2012-05-30 LAB — GLUCOSE, CAPILLARY: Glucose-Capillary: 109 mg/dL — ABNORMAL HIGH (ref 70–99)

## 2012-06-06 LAB — GLUCOSE, CAPILLARY
Glucose-Capillary: 89 mg/dL (ref 70–99)
Glucose-Capillary: 123 mg/dL — ABNORMAL HIGH (ref 70–99)

## 2012-06-09 LAB — GLUCOSE, CAPILLARY: Glucose-Capillary: 152 mg/dL — ABNORMAL HIGH (ref 70–99)

## 2012-06-16 LAB — GLUCOSE, CAPILLARY: Glucose-Capillary: 129 mg/dL — ABNORMAL HIGH (ref 70–99)

## 2012-06-20 LAB — GLUCOSE, CAPILLARY: Glucose-Capillary: 152 mg/dL — ABNORMAL HIGH (ref 70–99)

## 2012-06-23 LAB — GLUCOSE, CAPILLARY
Glucose-Capillary: 92 mg/dL (ref 70–99)
Glucose-Capillary: 116 mg/dL — ABNORMAL HIGH (ref 70–99)

## 2012-06-27 LAB — GLUCOSE, CAPILLARY: Glucose-Capillary: 87 mg/dL (ref 70–99)

## 2012-06-30 LAB — GLUCOSE, CAPILLARY: Glucose-Capillary: 152 mg/dL — ABNORMAL HIGH (ref 70–99)

## 2012-07-03 ENCOUNTER — Non-Acute Institutional Stay (SKILLED_NURSING_FACILITY): Payer: Medicare Other | Admitting: Internal Medicine

## 2012-07-03 DIAGNOSIS — I10 Essential (primary) hypertension: Secondary | ICD-10-CM

## 2012-07-03 DIAGNOSIS — I509 Heart failure, unspecified: Secondary | ICD-10-CM

## 2012-07-03 DIAGNOSIS — F0390 Unspecified dementia without behavioral disturbance: Secondary | ICD-10-CM

## 2012-07-03 DIAGNOSIS — I714 Abdominal aortic aneurysm, without rupture, unspecified: Secondary | ICD-10-CM

## 2012-07-03 DIAGNOSIS — E119 Type 2 diabetes mellitus without complications: Secondary | ICD-10-CM

## 2012-07-03 DIAGNOSIS — M810 Age-related osteoporosis without current pathological fracture: Secondary | ICD-10-CM

## 2012-07-03 DIAGNOSIS — I25810 Atherosclerosis of coronary artery bypass graft(s) without angina pectoris: Secondary | ICD-10-CM

## 2012-07-03 DIAGNOSIS — R569 Unspecified convulsions: Secondary | ICD-10-CM

## 2012-07-03 DIAGNOSIS — D649 Anemia, unspecified: Secondary | ICD-10-CM

## 2012-07-03 DIAGNOSIS — F32A Depression, unspecified: Secondary | ICD-10-CM

## 2012-07-03 DIAGNOSIS — F329 Major depressive disorder, single episode, unspecified: Secondary | ICD-10-CM

## 2012-07-06 DIAGNOSIS — E119 Type 2 diabetes mellitus without complications: Secondary | ICD-10-CM | POA: Insufficient documentation

## 2012-07-06 DIAGNOSIS — I10 Essential (primary) hypertension: Secondary | ICD-10-CM | POA: Insufficient documentation

## 2012-07-06 DIAGNOSIS — I714 Abdominal aortic aneurysm, without rupture: Secondary | ICD-10-CM | POA: Insufficient documentation

## 2012-07-06 DIAGNOSIS — I509 Heart failure, unspecified: Secondary | ICD-10-CM | POA: Insufficient documentation

## 2012-07-06 DIAGNOSIS — I25810 Atherosclerosis of coronary artery bypass graft(s) without angina pectoris: Secondary | ICD-10-CM | POA: Insufficient documentation

## 2012-07-06 DIAGNOSIS — F329 Major depressive disorder, single episode, unspecified: Secondary | ICD-10-CM | POA: Insufficient documentation

## 2012-07-06 DIAGNOSIS — D649 Anemia, unspecified: Secondary | ICD-10-CM | POA: Insufficient documentation

## 2012-07-06 DIAGNOSIS — F32A Depression, unspecified: Secondary | ICD-10-CM | POA: Insufficient documentation

## 2012-07-06 DIAGNOSIS — F0390 Unspecified dementia without behavioral disturbance: Secondary | ICD-10-CM | POA: Insufficient documentation

## 2012-07-06 DIAGNOSIS — R569 Unspecified convulsions: Secondary | ICD-10-CM | POA: Insufficient documentation

## 2012-07-06 DIAGNOSIS — M810 Age-related osteoporosis without current pathological fracture: Secondary | ICD-10-CM | POA: Insufficient documentation

## 2012-07-06 NOTE — Progress Notes (Signed)
Patient ID: [name], female   DOB: [birth year], 79 y.o.   MRN: [id number]  Chief complaint-medical management of dementia hypertension CHF diabetes type 2 seizure disorder history of abdominal aortic aneurysm.  History of present illness  Patient is a pleasant elderly resident with the above diagnoses she continues to be quite stable-I do not really note any recent acute issues.  She has progressive Alzheimer's disease and this continues to be at baseline she does ambulate about the facility in her wheelchair is confused.  She has a history of diabetes type 2 she is on Actos blood sugars appear to run largely from the 80s to the low 100s recent hemoglobin A1c was 6.5.  She continues on Keppra for a history of seizure disorder there've been no recent seizures.  Her weight continues to be stable she eats fairly well according to nursing staff although somewhat spotty at times.  Blood pressures appear stable she is on Norvasc as well as Lopressor and clonidine-recent blood pressures 120/74 100/76 142/70.  She has a history of abdominal aortic aneurysm this has been stable per CT scan done back in May of last year-2  Patient has no complaints today according to nursing staff continues to be stable.  Family medical social history has been reviewed.  Medications have been reviewed per MAR.  Review of systems Limited secondary to dementia please see history of present illness.  Gen. no fever chills noted her weight has been stable.  Respiratory no cough or shortness of breath has been noted by no recen  t constipation diarrhea nausea or vomiting according to nursing staff.  Neurologic-history of seizure disorder this has been stable for an extended period of time   Cardiac-she is not complaining of chest pain.  GI-no complaints of abdominal pain-nausea vomiting diarrhea or constipation  Physical exam.  Temperature 98.2 pulse 92 respirations 18 blood pressure 128/74 weight is  stable at 93.  In general this is a well-nourished elderly female in no distress sitting in her wheelchair.  Her skin is warm and dry.  Head ears eyes nose mouth and throat-she has prescription lenses extraocular movements intact pupils appear equal round reactive to light sclera and conjunctiva are clear.  Chest is clear to auscultation without rhonchi rales or wheezes.  Heart regular rate and rhythm without murmur gallop or rub-heart sounds are distant-she continues with baseline ankle edema bilaterally.  Abdomen is soft nontender with positive bowel sounds.  Muscle skeletal --moves all limbs x4 at baseline-- did not note any deformities-continues to ambulate in her wheelchair  . Neurologic-I do not see any focal deficits cranial nerves grossly intact speech is clear.  Psychiatric oriented to self only-was cooperative with exam and did follow simple verbal commands with fairly extensive prompting.        Labs.  06/15/2012.  T4-7.7----T3-2 .5-.  06/02/2012.  Hemoglobin A1c 6.5.  05/14/2012.  WBC 4.9 hemoglobin 11.1 platelets 188.  Sodium 137 potassium 3.8 BUN 10 creatinine 0.74.  02/25/2012.  Liver function tests within normal limits.  July 2013.  Cholesterol 192 triglycerides 148 HDL 47 LDL 1:15 [place]  Assessment and plan.  Number -1- dementia-this appears to be at baseline continues to function fairly well at current setting. Continue supportive care-she is followed by psych services  #2-hypertension this appears stable continues on Lopressor Norvasc and clonidine.  #3-diabetes type 2-appears stable on Actos hemoglobin A1c in CBGs are satisfactory.  #4 osteoporosis continues on calcium with vitamin D.  #5 seizure disorder-this has been  stable for an extended period of time she is on Keppra.  #6 CHF-this is stable Aldactone discontinued in the past secondary to renal insufficiency-clinically she appears to be stable.  #7-coronary artery disease-this is  stable she is on aspirin.  #8 anemia-this has been stable per recent lab to be likely chronic disease.  #9-history of abdominal aortic aneurysm-this will need updating.  #2 -- depression-this is stable on Lexapro.  Number 11-mildly elevated TSH of 5.2 back in March-update thyroid studies have been ordered as noted above in labs-- will continue to monitor  [phone]

## 2012-07-07 LAB — GLUCOSE, CAPILLARY: Glucose-Capillary: 96 mg/dL (ref 70–99)

## 2012-07-08 ENCOUNTER — Ambulatory Visit (HOSPITAL_COMMUNITY)
Admit: 2012-07-08 | Discharge: 2012-07-08 | Disposition: A | Discharge status: Home / Self Care | Payer: Medicare Other | Source: Ambulatory Visit | Attending: Internal Medicine | Admitting: Internal Medicine

## 2012-07-08 DIAGNOSIS — I714 Abdominal aortic aneurysm, without rupture, unspecified: Secondary | ICD-10-CM | POA: Insufficient documentation

## 2012-07-14 LAB — GLUCOSE, CAPILLARY
Glucose-Capillary: 159 mg/dL — ABNORMAL HIGH (ref 70–99)
Glucose-Capillary: 116 mg/dL — ABNORMAL HIGH (ref 70–99)

## 2012-07-15 ENCOUNTER — Ambulatory Visit (HOSPITAL_COMMUNITY): Payer: Medicare Other

## 2012-07-17 ENCOUNTER — Ambulatory Visit (HOSPITAL_COMMUNITY): Payer: Medicare Other

## 2012-07-18 ENCOUNTER — Ambulatory Visit (HOSPITAL_COMMUNITY): Admit: 2012-07-18 | Payer: Medicare Other

## 2012-07-18 LAB — GLUCOSE, CAPILLARY: Glucose-Capillary: 138 mg/dL — ABNORMAL HIGH (ref 70–99)

## 2012-07-21 LAB — GLUCOSE, CAPILLARY: Glucose-Capillary: 92 mg/dL (ref 70–99)

## 2012-07-22 ENCOUNTER — Ambulatory Visit (HOSPITAL_COMMUNITY): Payer: Medicare Other

## 2012-07-25 LAB — GLUCOSE, CAPILLARY: Glucose-Capillary: 146 mg/dL — ABNORMAL HIGH (ref 70–99)

## 2012-07-28 LAB — GLUCOSE, CAPILLARY: Glucose-Capillary: 85 mg/dL (ref 70–99)

## 2012-08-04 LAB — GLUCOSE, CAPILLARY
Glucose-Capillary: 78 mg/dL (ref 70–99)
Glucose-Capillary: 183 mg/dL — ABNORMAL HIGH (ref 70–99)

## 2012-08-08 LAB — GLUCOSE, CAPILLARY
Glucose-Capillary: 81 mg/dL (ref 70–99)
Glucose-Capillary: 134 mg/dL — ABNORMAL HIGH (ref 70–99)

## 2012-08-11 LAB — GLUCOSE, CAPILLARY
Glucose-Capillary: 92 mg/dL (ref 70–99)
Glucose-Capillary: 223 mg/dL — ABNORMAL HIGH (ref 70–99)

## 2012-08-15 LAB — GLUCOSE, CAPILLARY: Glucose-Capillary: 135 mg/dL — ABNORMAL HIGH (ref 70–99)

## 2012-08-18 LAB — GLUCOSE, CAPILLARY
Glucose-Capillary: 97 mg/dL (ref 70–99)
Glucose-Capillary: 160 mg/dL — ABNORMAL HIGH (ref 70–99)

## 2012-08-21 LAB — GLUCOSE, CAPILLARY: Glucose-Capillary: 100 mg/dL — ABNORMAL HIGH (ref 70–99)

## 2012-08-22 LAB — GLUCOSE, CAPILLARY: Glucose-Capillary: 160 mg/dL — ABNORMAL HIGH (ref 70–99)

## 2012-08-25 ENCOUNTER — Other Ambulatory Visit: Payer: Self-pay | Admitting: *Deleted

## 2012-08-25 LAB — GLUCOSE, CAPILLARY: Glucose-Capillary: 168 mg/dL — ABNORMAL HIGH (ref 70–99)

## 2012-08-25 MED ORDER — LORAZEPAM 0.5 MG PO TABS: 0.5000 mg | ORAL_TABLET | Freq: Two times a day (BID) | ORAL | Status: DC | PRN

## 2012-08-29 LAB — GLUCOSE, CAPILLARY: Glucose-Capillary: 91 mg/dL (ref 70–99)

## 2012-09-01 LAB — GLUCOSE, CAPILLARY
Glucose-Capillary: 102 mg/dL — ABNORMAL HIGH (ref 70–99)
Glucose-Capillary: 100 mg/dL — ABNORMAL HIGH (ref 70–99)

## 2012-09-05 LAB — GLUCOSE, CAPILLARY
Glucose-Capillary: 88 mg/dL (ref 70–99)
Glucose-Capillary: 132 mg/dL — ABNORMAL HIGH (ref 70–99)

## 2012-09-12 LAB — GLUCOSE, CAPILLARY

## 2012-09-15 LAB — GLUCOSE, CAPILLARY: Glucose-Capillary: 115 mg/dL — ABNORMAL HIGH (ref 70–99)

## 2012-09-19 LAB — GLUCOSE, CAPILLARY
Glucose-Capillary: 92 mg/dL (ref 70–99)
Glucose-Capillary: 175 mg/dL — ABNORMAL HIGH (ref 70–99)

## 2012-09-22 LAB — GLUCOSE, CAPILLARY: Glucose-Capillary: 91 mg/dL (ref 70–99)

## 2012-09-26 LAB — GLUCOSE, CAPILLARY: Glucose-Capillary: 82 mg/dL (ref 70–99)

## 2012-09-29 LAB — GLUCOSE, CAPILLARY: Glucose-Capillary: 99 mg/dL (ref 70–99)

## 2012-10-03 LAB — GLUCOSE, CAPILLARY: Glucose-Capillary: 176 mg/dL — ABNORMAL HIGH (ref 70–99)

## 2012-10-06 LAB — GLUCOSE, CAPILLARY
Glucose-Capillary: 71 mg/dL (ref 70–99)
Glucose-Capillary: 119 mg/dL — ABNORMAL HIGH (ref 70–99)

## 2012-10-10 LAB — GLUCOSE, CAPILLARY: Glucose-Capillary: 92 mg/dL (ref 70–99)

## 2012-10-11 LAB — GLUCOSE, CAPILLARY: Glucose-Capillary: 210 mg/dL — ABNORMAL HIGH (ref 70–99)

## 2012-10-17 LAB — GLUCOSE, CAPILLARY: Glucose-Capillary: 108 mg/dL — ABNORMAL HIGH (ref 70–99)

## 2012-10-20 LAB — GLUCOSE, CAPILLARY: Glucose-Capillary: 106 mg/dL — ABNORMAL HIGH (ref 70–99)

## 2012-10-21 LAB — GLUCOSE, CAPILLARY: Glucose-Capillary: 133 mg/dL — ABNORMAL HIGH (ref 70–99)

## 2012-10-27 LAB — GLUCOSE, CAPILLARY: Glucose-Capillary: 88 mg/dL (ref 70–99)

## 2012-10-31 LAB — GLUCOSE, CAPILLARY: Glucose-Capillary: 71 mg/dL (ref 70–99)

## 2012-11-03 LAB — GLUCOSE, CAPILLARY

## 2012-11-06 ENCOUNTER — Non-Acute Institutional Stay (SKILLED_NURSING_FACILITY): Payer: Medicare Other | Admitting: Internal Medicine

## 2012-11-06 DIAGNOSIS — I714 Abdominal aortic aneurysm, without rupture: Secondary | ICD-10-CM

## 2012-11-06 DIAGNOSIS — F329 Major depressive disorder, single episode, unspecified: Secondary | ICD-10-CM

## 2012-11-06 DIAGNOSIS — F0280 Dementia in other diseases classified elsewhere without behavioral disturbance: Secondary | ICD-10-CM

## 2012-11-06 DIAGNOSIS — M810 Age-related osteoporosis without current pathological fracture: Secondary | ICD-10-CM

## 2012-11-06 DIAGNOSIS — I10 Essential (primary) hypertension: Secondary | ICD-10-CM

## 2012-11-06 DIAGNOSIS — R569 Unspecified convulsions: Secondary | ICD-10-CM

## 2012-11-06 DIAGNOSIS — I509 Heart failure, unspecified: Secondary | ICD-10-CM

## 2012-11-06 DIAGNOSIS — I25810 Atherosclerosis of coronary artery bypass graft(s) without angina pectoris: Secondary | ICD-10-CM

## 2012-11-06 DIAGNOSIS — E119 Type 2 diabetes mellitus without complications: Secondary | ICD-10-CM

## 2012-11-06 DIAGNOSIS — D649 Anemia, unspecified: Secondary | ICD-10-CM

## 2012-11-06 NOTE — Progress Notes (Signed)
Patient ID: [name], female   DOB: 01-02-1926, 79 y.o.   MRN: [id number] Patient ID: [name], female DOB: February 08, 1926, 79 y.o. MRN: [id number]    This is a routine visit.  Level of care skilled.  Facility [facility].   Chief complaint-medical management of dementia hypertension CHF diabetes type 2 seizure disorder history of abdominal aortic aneurysm.   History of present illness  Patient is a pleasant elderly resident with the above diagnoses she continues to be fairly  stable-I do not really note any recent acute issues--although her dementia does appear to be progressing.  She has progressive Alzheimer  Disease    she does ambulate about the facility in her wheelchair is confused-does have occasional periods of agitation.  I do note she appears to have lost about 5 pounds since June-she is on Magic cup twice a day as well as Glucerna 3 times a day apparently her appetite is spotty sometimes eats quite well and other times not much at all.  .  She has a history of diabetes type 2 she is on Actos blood sugars appear to run largely from the 80s to the low-mid   100s recent hemoglobin A1c was 5.8--in June.  She continues on Keppra for a history of seizure disorder there've been no recent seizures.   .  Blood pressures appear stable she is on Norvasc as well as Lopressor and clonidine-recent blood pressures 144/72-134/74--122/65--appeared to generally be in this range the past month although occasional spikes-- I did take it manually today and got 160/70-however she was quite agitated at that time and I suspect this leads to some elevated readings at times  She has a history of abdominal aortic aneurysm this has been stable per CT scan done back in May of last year- an ultrasound ordered earlier the sugar was nondiagnostic because of bowel gas-an unenhanced CT was suggested-this was ordered--but apparently could not be performed secondary to patient agitation Patient has no complaints today  according to nursing staff continues to be at baseline .  Family medical social history has been reviewed per history and physical on 08/16/2010.   Medications have been reviewed per MAR.   Review of systems Limited secondary to dementia please see history of present illness.  Gen. no fever chills noted appears to have lost some weight.  Respiratory no cough or shortness of breath has been noted .  Neurologic-history of seizure disorder this has been stable for an extended period of time  Cardiac-she is not complaining of chest pain.  GI-no complaints of abdominal pain-nausea vomiting diarrhea or constipation   Physical exam.  Temperature is 98.0 pulse 89 respirations 20 blood pressure is stated in history of present illness weight is 87 pounds this appears to have been somewhat gradual weight loss.  In general this is a well-nourished elderly female in no distress sitting in her wheelchair--she is agitated with exam.  Her skin is warm and dry.  Head ears eyes nose mouth and throat-she has prescription lenses extraocular movements intact pupils appear equal round reactive to light sclera and conjunctiva are clear.  Chest is clear to auscultation without rhonchi rales or wheezes.  Heart regular rate and rhythm without murmur gallop or rub-heart sounds are distant-she continues with baseline ankle edema bilaterally.  Abdomen is soft nontender with positive bowel sounds.  Muscle skeletal --moves all limbs x4 at baseline-- did not note any deformities-continues to ambulate in her wheelchair  . Neurologic-I do not see any focal deficits  cranial nerves grossly intact speech is clear--moves all her extremities at baseline.  Psychiatric oriented to self only-was not real cooperative with exam  .  Labs  08/11/2012.  Hemoglobin A1c 5.8.  07/11/2012.  Cholesterol 238 triglycerides 88 HDL 60 LDL 160.  07/10/2012.  Sodium 138 potassium 4.3 BUN 17 tract 0.85.  Marland Kitchen  06/15/2012.   T4-7.7----T3-2 .5-.  06/02/2012.  Hemoglobin A1c 6.5.  05/14/2012.  WBC 4.9 hemoglobin 11.1 platelets 188.  Sodium 137 potassium 3.8 BUN 10 creatinine 0.74.  02/25/2012.  Liver function tests within normal limits.  July 2013.  Cholesterol 192 triglycerides 148 HDL 47 LDL 1:15   Assessment and plan.  Number -1- dementia-this appears to be slowly progressing  continues to function fairly well at current setting. Continue supportive care-she is followed by psych services  #2-hypertension this appears stable continues on Lopressor Norvasc and clonidine--will see if staff can check manual blood pressure for 3 days with the log for provider review-again this may be difficult secondary to patient agitation.  #3-diabetes type 2-appears stable on Actos hemoglobin A1c - CBGs are satisfactory.  #4 osteoporosis continues on calcium with vitamin D.  #5 seizure disorder-this has been stable for an extended period of time she is on Keppra.  #6 CHF-this is stable Aldactone discontinued in the past secondary to renal insufficiency-clinically she appears to be stable.--We'll update metabolic panel  #7-coronary artery disease-this is stable she is on aspirin.  #8 anemia-this has been stable --thought likely chronic disease-we'll update CBC  #9-history of abdominal aortic aneurysm-. An ultrasound was ordered however it was nondiagnostic obscured by bowel gas suggested CT follow which was ordered--unfortunately patient was quite agitated and this could not be done..-- at this point do not see the value of further diagnostic imaging patient would be a very poor candidate for any aggressive workup and she has great anxiety with attempts at obtaining a CT scan..--this was discussed with Dr. Leanord Hawking via phone #2 -- depression-this is stable on Lexapro.-- is followed by psych services  Number 11-mildly elevated TSH --T4 was within normal limits Will update TSH . #12-weight loss-this appears to be mildly gradual-will  have dietary take a look at this also will order an albumin level she is on numerous supplements will have to monitor this closely with weekly weights  [phone]

## 2012-11-07 LAB — GLUCOSE, CAPILLARY

## 2012-11-10 LAB — GLUCOSE, CAPILLARY
Glucose-Capillary: 97 mg/dL (ref 70–99)
Glucose-Capillary: 185 mg/dL — ABNORMAL HIGH (ref 70–99)

## 2012-11-14 LAB — GLUCOSE, CAPILLARY: Glucose-Capillary: 78 mg/dL (ref 70–99)

## 2012-11-17 LAB — GLUCOSE, CAPILLARY: Glucose-Capillary: 91 mg/dL (ref 70–99)

## 2012-11-21 LAB — GLUCOSE, CAPILLARY: Glucose-Capillary: 130 mg/dL — ABNORMAL HIGH (ref 70–99)

## 2012-11-24 LAB — GLUCOSE, CAPILLARY
Glucose-Capillary: 84 mg/dL (ref 70–99)
Glucose-Capillary: 89 mg/dL (ref 70–99)

## 2012-11-28 LAB — GLUCOSE, CAPILLARY: Glucose-Capillary: 81 mg/dL (ref 70–99)

## 2012-12-05 LAB — GLUCOSE, CAPILLARY: Glucose-Capillary: 78 mg/dL (ref 70–99)

## 2012-12-08 LAB — GLUCOSE, CAPILLARY: Glucose-Capillary: 68 mg/dL — ABNORMAL LOW (ref 70–99)

## 2012-12-12 LAB — GLUCOSE, CAPILLARY
Glucose-Capillary: 90 mg/dL (ref 70–99)
Glucose-Capillary: 120 mg/dL — ABNORMAL HIGH (ref 70–99)

## 2012-12-15 LAB — GLUCOSE, CAPILLARY
Glucose-Capillary: 86 mg/dL (ref 70–99)
Glucose-Capillary: 195 mg/dL — ABNORMAL HIGH (ref 70–99)

## 2012-12-19 LAB — GLUCOSE, CAPILLARY: Glucose-Capillary: 82 mg/dL (ref 70–99)

## 2012-12-22 LAB — GLUCOSE, CAPILLARY: Glucose-Capillary: 152 mg/dL — ABNORMAL HIGH (ref 70–99)

## 2012-12-26 LAB — GLUCOSE, CAPILLARY: Glucose-Capillary: 144 mg/dL — ABNORMAL HIGH (ref 70–99)

## 2012-12-29 LAB — GLUCOSE, CAPILLARY
Glucose-Capillary: 116 mg/dL — ABNORMAL HIGH (ref 70–99)
Glucose-Capillary: 98 mg/dL (ref 70–99)

## 2013-01-02 LAB — GLUCOSE, CAPILLARY: Glucose-Capillary: 74 mg/dL (ref 70–99)

## 2013-01-09 LAB — GLUCOSE, CAPILLARY: Glucose-Capillary: 88 mg/dL (ref 70–99)

## 2013-01-12 LAB — GLUCOSE, CAPILLARY: Glucose-Capillary: 135 mg/dL — ABNORMAL HIGH (ref 70–99)

## 2013-01-16 LAB — GLUCOSE, CAPILLARY
Glucose-Capillary: 87 mg/dL (ref 70–99)
Glucose-Capillary: 245 mg/dL — ABNORMAL HIGH (ref 70–99)

## 2013-01-19 LAB — GLUCOSE, CAPILLARY

## 2013-01-23 LAB — GLUCOSE, CAPILLARY
Glucose-Capillary: 114 mg/dL — ABNORMAL HIGH (ref 70–99)
Glucose-Capillary: 186 mg/dL — ABNORMAL HIGH (ref 70–99)

## 2013-01-26 LAB — GLUCOSE, CAPILLARY
Glucose-Capillary: 86 mg/dL (ref 70–99)
Glucose-Capillary: 163 mg/dL — ABNORMAL HIGH (ref 70–99)

## 2013-01-30 LAB — GLUCOSE, CAPILLARY
Glucose-Capillary: 96 mg/dL (ref 70–99)
Glucose-Capillary: 143 mg/dL — ABNORMAL HIGH (ref 70–99)

## 2013-02-02 LAB — GLUCOSE, CAPILLARY
Glucose-Capillary: 88 mg/dL (ref 70–99)
Glucose-Capillary: 139 mg/dL — ABNORMAL HIGH (ref 70–99)

## 2013-02-06 LAB — GLUCOSE, CAPILLARY: Glucose-Capillary: 107 mg/dL — ABNORMAL HIGH (ref 70–99)

## 2013-02-13 LAB — GLUCOSE, CAPILLARY: Glucose-Capillary: 174 mg/dL — ABNORMAL HIGH (ref 70–99)

## 2013-02-16 LAB — GLUCOSE, CAPILLARY
Glucose-Capillary: 247 mg/dL — ABNORMAL HIGH (ref 70–99)
Glucose-Capillary: 95 mg/dL (ref 70–99)

## 2013-02-20 LAB — GLUCOSE, CAPILLARY

## 2013-02-23 LAB — GLUCOSE, CAPILLARY: Glucose-Capillary: 122 mg/dL — ABNORMAL HIGH (ref 70–99)

## 2013-02-24 ENCOUNTER — Non-Acute Institutional Stay (SKILLED_NURSING_FACILITY): Payer: Medicare Other | Admitting: Internal Medicine

## 2013-02-24 DIAGNOSIS — F0391 Unspecified dementia with behavioral disturbance: Secondary | ICD-10-CM

## 2013-02-24 DIAGNOSIS — M810 Age-related osteoporosis without current pathological fracture: Secondary | ICD-10-CM

## 2013-02-24 DIAGNOSIS — I25810 Atherosclerosis of coronary artery bypass graft(s) without angina pectoris: Secondary | ICD-10-CM

## 2013-02-24 DIAGNOSIS — I714 Abdominal aortic aneurysm, without rupture: Secondary | ICD-10-CM

## 2013-02-24 DIAGNOSIS — F329 Major depressive disorder, single episode, unspecified: Secondary | ICD-10-CM

## 2013-02-24 DIAGNOSIS — R509 Fever, unspecified: Secondary | ICD-10-CM

## 2013-02-24 DIAGNOSIS — E119 Type 2 diabetes mellitus without complications: Secondary | ICD-10-CM

## 2013-02-24 DIAGNOSIS — I509 Heart failure, unspecified: Secondary | ICD-10-CM

## 2013-02-24 DIAGNOSIS — D649 Anemia, unspecified: Secondary | ICD-10-CM

## 2013-02-24 DIAGNOSIS — R569 Unspecified convulsions: Secondary | ICD-10-CM

## 2013-02-24 DIAGNOSIS — I10 Essential (primary) hypertension: Secondary | ICD-10-CM

## 2013-02-24 NOTE — Progress Notes (Signed)
Patient ID: [name], female   DOB: 09/21/1925, 79 y.o.   MRN: [id number]                               This is a routine-acute   visit.   Level of care skilled.   Facility [facility].     Chief complaint-medical management of dementia hypertension CHF diabetes type 2 seizure disorder history of abdominal aortic aneurysm--- acute visit secondary to fever of unknown origin-elevated blood pressure.    History of present illness   Patient is an elderly resident with the above diagnoses-- her dementia does appear to be progressing.   She has progressive Alzheimer  Disease    she does ambulate about the facility in her wheelchair is confused-does have occasional periods of agitation Most acute issue this evening is a fever of 100.9 as apparently this started last night and then normalized again this evening is 100.9.  She is a poor historian secondary to dementia apparently she is having some cough.  Also noted she appears at times to have some elevated systolics the latest I see is 183/91 most recently I did take this manually this evening and it was 200/80 previous blood pressures have been 146/74-150/64.  She is on clonidine 0.1 mg twice a day as well as Lopressor 75 mg twice a day and Norvasc 10 mg daily.    Back in September it was noted she lost about 5 pounds since June-this appears to have stabilized most recent weight is 86.2 which is roughly what it was back in September.  Currently her appetite continues to be somewhat spotty she continues on Magic cup twice a day     .   She has a history of diabetes type 2 she is on Actos blood sugars appear to run largely from the 80s to the low-mid   100s recent hemoglobin A1c was 6.0--in September.   She continues on Keppra for a history of seizure disorder there've been no recent seizures.      She has a history of abdominal aortic aneurysm this has been stable per CT scan done back in May of last year- an ultrasound  ordered earlier the sugar was nondiagnostic because of bowel gas-an unenhanced CT was suggested-this was ordered--but apparently could not be performed secondary to patient agitation-- Patient has no complaints today according to nursing staff continues to be at baseline .   Family medical social history has been reviewed per history and physical on 08/16/2010.    Medications have been reviewed per MAR.    Review of systems Limited secondary to dementia please see history of present illness.   Gen. Has a fever does not appear to have chills her weight appears to have stabilized the past couple months.   Respiratory --has a dry cough does not appear to be short of breath .   Neurologic-history of seizure disorder this has been stable for an extended period of time   Cardiac-she is not complaining of chest pain.   GI-no complaints of abdominal pain-nausea vomiting diarrhea or constipation    Physical exam.    Temperature is 98.7 pulse done manually was 94 respirations 20 blood pressure 200/80 done manually-her weight is 86.2 this appears to be fairly stable with the September weight.    In general this is a well-nourished elderly female in no distress sitting in her wheelchair--she is agitated with exam.   Her skin  is warm and dry.   Head ears eyes nose mouth and throat-she has prescription lenses extraocular movements intact pupils appear equal round reactive to light sclera and conjunctiva are clear.   Chest -no sign of labored breathing however difficult exam secondary to patient agitation-I cannot really hear any congestion however.   Heart regular rate and rhythm without murmur gallop or rub- .   Abdomen is soft nontender with positive bowel sounds.   Muscle skeletal --moves all limbs x4 at baseline-- did not note any deformities-continues to ambulate in her wheelchair   . Neurologic-I do not see any focal deficits cranial nerves grossly intact speech is clear--moves all her extremities  at baseline.   Psychiatric oriented to self only-was not real cooperative with exam   .   Labs  11/26/2012.  Hemoglobin A1c 6.0.  11/10/2012.  TSH-3.605.  11/10/2012.  WBC 5.5 hemoglobin 11.8 platelets 162.  Sodium 138 potassium 4.0 BUN 12 creatinine 0.82.  Liver function tests within normal limits except albumin of 3.1.     08/11/2012.   Hemoglobin A1c 5.8.   07/11/2012.   Cholesterol 238 triglycerides 88 HDL 60 LDL 160.   07/10/2012.   Sodium 138 potassium 4.3 BUN 17 tract 0.85.   Marland Kitchen   06/15/2012.   T4-7.7----T3-2 .5-.   06/02/2012.   Hemoglobin A1c 6.5.   05/14/2012.   WBC 4.9 hemoglobin 11.1 platelets 188.   Sodium 137 potassium 3.8 BUN 10 creatinine 0.74.   02/25/2012.   Liver function tests within normal limits.   July 2013.   Cholesterol 192 triglycerides 148 HDL 47 LDL 1:15    Assessment and plan #1-fever unknown origin-we'll order blood work including a CBC with differential-also will obtain a chest x-ray as well as UA C  And S- We'll order a swab for  influenza and start her empirically her on Tamiflu 75 mg twice a day x5 days since another resident in the facility has been diagnosed with influenza recently We'll also start Levaquin empirically secondary to her elevated temperature 500 mg one day and then 250 mg for 6 additional days-monitor closely vital signs pulse ox every shift.  Also for dry cough will start Muccinexx  600 mg twice a day for 5 days.   Number -2- dementia-this appears to be slowly progressing  continues to function fairly well at current setting. Continue supportive care-she is followed by psych services--he does have Ativan as needed for agitation which appears to be needed   #3-hypertension -- continues on Lopressor Norvasc and clonidine-systolic is elevated this evening we'll give the clonidine 0.1 mg early-on it her blood pressures in one hour in 2 hours--some of this elevation may be caused by agitation which may  artificially inflate her blood pressure-at some point will csider increasing her clonidine for now monitor again her vital signs every shift f to keep an eye on her blood pressure as well as other issues    #4-diabetes type 2-appears stable on Actos hemoglobin A1c - CBGs are satisfactory.   #5 osteoporosis continues on calcium with vitamin D.   #6 seizure disorder-this has been stable for an extended period of time she is on Keppra.   #7 CHF-this is stable Aldactone discontinued in the past secondary to renal insufficiency-clinically she appears to be stable.--We'll update metabolic panel   #1-OXWRUEAV artery disease-this is stable she is on aspirin.   #10 anemia-this has been stable --thought likely chronic disease-we'll update CBC   #11-history of abdominal aortic aneurysm-. An ultrasound was  ordered however it was nondiagnostic obscured by bowel gas suggested CT follow which was ordered--unfortunately patient was quite agitated and this could not be done..-- at this point do not see the value of further diagnostic imaging patient would be a very poor candidate for any aggressive workup and she has great anxiety with attempts at obtaining a CT scan..--this was discussed with Dr. Leanord Hawking via phone 12 -- depression-t on Lexapro.-- is followed by psych services   Number 11-mildly elevated TSH --recent TSH within normal limits . #13-weight loss--this appears to have stabilized recently-she is on supplements-I suspect at some point in the future this will continue to be a challenge with her progressing dementia    [id number]--of note greater than 40 minutes spent assessing patient-and coordinating and formulating a plan of care for numerous diagnoses

## 2013-02-25 ENCOUNTER — Non-Acute Institutional Stay (SKILLED_NURSING_FACILITY): Payer: Medicare Other | Admitting: Internal Medicine

## 2013-02-25 DIAGNOSIS — J09X2 Influenza due to identified novel influenza A virus with other respiratory manifestations: Secondary | ICD-10-CM

## 2013-02-25 LAB — GLUCOSE, CAPILLARY: Glucose-Capillary: 94 mg/dL (ref 70–99)

## 2013-02-27 LAB — GLUCOSE, CAPILLARY
Glucose-Capillary: 92 mg/dL (ref 70–99)
Glucose-Capillary: 137 mg/dL — ABNORMAL HIGH (ref 70–99)

## 2013-03-02 LAB — GLUCOSE, CAPILLARY

## 2013-03-06 LAB — GLUCOSE, CAPILLARY
GLUCOSE-CAPILLARY: 82 mg/dL (ref 70–99)
Glucose-Capillary: 131 mg/dL — ABNORMAL HIGH (ref 70–99)

## 2013-03-09 LAB — GLUCOSE, CAPILLARY
GLUCOSE-CAPILLARY: 86 mg/dL (ref 70–99)
Glucose-Capillary: 83 mg/dL (ref 70–99)

## 2013-03-09 NOTE — Progress Notes (Signed)
Patient ID: [name], female   DOB: August 27, 1925, 80 y.o.   MRN: [id number]           PROGRESS NOTE  DATE:  02/25/2013    FACILITY: [facility]    LEVEL OF CARE:   SNF   Acute Visit   CHIEF COMPLAINT:  Cough and fever.    HISTORY OF PRESENT ILLNESS:  This is a very demented woman who wanders in the facility in her wheelchair.    She developed a harsh, productive cough, although she is not febrile.    A nasopharyngeal swab, however, has shown influenza A positive.  She has, therefore, been isolated and put on Tamiflu.    PHYSICAL EXAMINATION:   VITAL SIGNS:   TEMPERATURE:  98.6.   PULSE:   60.   RESPIRATIONS:  20.   O2 SATURATIONS:  95% on room air.   BLOOD PRESSURE:  139/80.   GENERAL APPEARANCE:  The patient does not look to be in any distress, although she is combative.     CHEST/RESPIRATORY:  Shallow, but otherwise clear.  She is not working hard to breathe.   CARDIOVASCULAR:  CARDIAC:  She does not appear to be dehydrated.  However, the patient is much too restless to examine.    ASSESSMENT/PLAN:  Influenza A.  At this point, the patient appears to be reasonably stable.   She is not febrile, not hypoxic, and not working hard to breathe.   She has been put on isolation.  She will receive Tamiflu and we will follow her clinically at this point.    CPT CODE: 35361

## 2013-03-13 LAB — GLUCOSE, CAPILLARY
GLUCOSE-CAPILLARY: 76 mg/dL (ref 70–99)
Glucose-Capillary: 81 mg/dL (ref 70–99)

## 2013-03-16 LAB — GLUCOSE, CAPILLARY: GLUCOSE-CAPILLARY: 159 mg/dL — AB (ref 70–99)

## 2013-03-20 LAB — GLUCOSE, CAPILLARY
GLUCOSE-CAPILLARY: 90 mg/dL (ref 70–99)
Glucose-Capillary: 129 mg/dL — ABNORMAL HIGH (ref 70–99)

## 2013-03-21 LAB — GLUCOSE, CAPILLARY: Glucose-Capillary: 95 mg/dL (ref 70–99)

## 2013-03-23 LAB — GLUCOSE, CAPILLARY
Glucose-Capillary: 135 mg/dL — ABNORMAL HIGH (ref 70–99)
Glucose-Capillary: 163 mg/dL — ABNORMAL HIGH (ref 70–99)

## 2013-03-27 LAB — GLUCOSE, CAPILLARY
GLUCOSE-CAPILLARY: 96 mg/dL (ref 70–99)
GLUCOSE-CAPILLARY: 171 mg/dL — AB (ref 70–99)

## 2013-03-30 LAB — GLUCOSE, CAPILLARY: Glucose-Capillary: 163 mg/dL — ABNORMAL HIGH (ref 70–99)

## 2013-04-03 LAB — GLUCOSE, CAPILLARY
GLUCOSE-CAPILLARY: 68 mg/dL — AB (ref 70–99)
GLUCOSE-CAPILLARY: 86 mg/dL (ref 70–99)
Glucose-Capillary: 165 mg/dL — ABNORMAL HIGH (ref 70–99)

## 2013-04-05 LAB — GLUCOSE, CAPILLARY: Glucose-Capillary: 202 mg/dL — ABNORMAL HIGH (ref 70–99)

## 2013-04-06 LAB — GLUCOSE, CAPILLARY
Glucose-Capillary: 88 mg/dL (ref 70–99)
Glucose-Capillary: 120 mg/dL — ABNORMAL HIGH (ref 70–99)

## 2013-04-10 LAB — GLUCOSE, CAPILLARY
GLUCOSE-CAPILLARY: 82 mg/dL (ref 70–99)
Glucose-Capillary: 229 mg/dL — ABNORMAL HIGH (ref 70–99)

## 2013-04-13 LAB — GLUCOSE, CAPILLARY
Glucose-Capillary: 106 mg/dL — ABNORMAL HIGH (ref 70–99)
Glucose-Capillary: 83 mg/dL (ref 70–99)

## 2013-04-17 LAB — GLUCOSE, CAPILLARY
Glucose-Capillary: 133 mg/dL — ABNORMAL HIGH (ref 70–99)
Glucose-Capillary: 182 mg/dL — ABNORMAL HIGH (ref 70–99)

## 2013-04-19 LAB — GLUCOSE, CAPILLARY: Glucose-Capillary: 134 mg/dL — ABNORMAL HIGH (ref 70–99)

## 2013-04-20 LAB — GLUCOSE, CAPILLARY: GLUCOSE-CAPILLARY: 86 mg/dL (ref 70–99)

## 2013-04-21 LAB — GLUCOSE, CAPILLARY: Glucose-Capillary: 152 mg/dL — ABNORMAL HIGH (ref 70–99)

## 2013-04-24 LAB — GLUCOSE, CAPILLARY
Glucose-Capillary: 87 mg/dL (ref 70–99)
Glucose-Capillary: 189 mg/dL — ABNORMAL HIGH (ref 70–99)

## 2013-04-27 LAB — GLUCOSE, CAPILLARY: GLUCOSE-CAPILLARY: 83 mg/dL (ref 70–99)

## 2013-04-29 ENCOUNTER — Non-Acute Institutional Stay (SKILLED_NURSING_FACILITY): Payer: Medicare Other | Admitting: Internal Medicine

## 2013-04-29 DIAGNOSIS — R569 Unspecified convulsions: Secondary | ICD-10-CM

## 2013-04-29 DIAGNOSIS — I10 Essential (primary) hypertension: Secondary | ICD-10-CM

## 2013-04-29 DIAGNOSIS — I25810 Atherosclerosis of coronary artery bypass graft(s) without angina pectoris: Secondary | ICD-10-CM

## 2013-04-29 DIAGNOSIS — F0390 Unspecified dementia without behavioral disturbance: Secondary | ICD-10-CM

## 2013-04-29 DIAGNOSIS — E119 Type 2 diabetes mellitus without complications: Secondary | ICD-10-CM

## 2013-04-29 DIAGNOSIS — J309 Allergic rhinitis, unspecified: Secondary | ICD-10-CM

## 2013-04-29 DIAGNOSIS — I714 Abdominal aortic aneurysm, without rupture, unspecified: Secondary | ICD-10-CM

## 2013-04-29 DIAGNOSIS — D649 Anemia, unspecified: Secondary | ICD-10-CM

## 2013-04-29 DIAGNOSIS — H109 Unspecified conjunctivitis: Secondary | ICD-10-CM

## 2013-04-29 DIAGNOSIS — I509 Heart failure, unspecified: Secondary | ICD-10-CM

## 2013-04-29 NOTE — Progress Notes (Signed)
Patient ID: [name], female   DOB: August 26, 1925, 80 y.o.   MRN: [id number]   This is a routine-acute visit.  Level of care skilled.  Facility [facility].   Chief complaint-medical management of dementia hypertension CHF diabetes type 2 seizure disorder history of abdominal aortic aneurysm--- acute visit secondary to erythematous eyes question allergies   History of present illness  Patient is an elderly resident with the above diagnoses-- her dementia does appear to be progressing.  She has progressive Alzheimer Disease she does ambulate about the facility in her wheelchair is confused-does have occasional periods of agitation   She was treated for influenza several weeks ago and appears to have recovered from this unremarkably.  She has developed some erythema of her eyes bilaterally as well as apparently some nasal throat congestion nursing staff feels she's having allergies in regards to other issues she's been relatively stable.  She does have variable systolic blood pressure readings 179/75-127/68-she has frequent agitation when . measured I suspect this may be contributing to it --elevations did not appear to be consistent She is on clonidine 0.1 mg twice a day as well as Lopressor 75 mg twice a day and Norvasc 10 mg daily.  Back in September it was noted she lost about 5 pounds since June-this appears to have stabilized most recent weight is 84.4 which is roughly what it was back in September.  Currently her appetite continues to be somewhat spotty she continues on Magic cup twice a day  .  She has a history of diabetes type 2 she is on Actos blood sugars appear to run largely from the 80s to the low-mid 100s recent hemoglobin A1c was 6.0--in September.  She continues on Keppra for a history of seizure disorder there've been no recent seizures.  She has a history of abdominal aortic aneurysm this has been stable per CT scan done back in May of l2013-- an ultrasound ordered last year-- was  nondiagnostic because of bowel gas-an unenhanced CT was suggested-this was ordered--but apparently could not be performed secondary to patient agitation--  Patient has no complaints today according to nursing staff continues to be at baseline  .  Family medical social history has been reviewed per history and physical on 08/16/2010.   Medications have been reviewed per MAR.   Review of systems Limited secondary to dementia please see history of present illness.  Gen. Has a fever does not appear to have chills her weight appears to have stabilized somewhat Skin-does not complaining of any itching nursing staff has not noted rashes.  Eyes-has erythematous conjunctivae bilaterally --does not appear to have any visual changes or discomfort--apparently nursing staff has noted some clear drainage as well Most-apparently there's been some clear drainage.  Respiratory --nursing staff has not really noted any increased shortness of breath  Or persistent cough .  Neurologic-history of seizure disorder this has been stable for an extended period of time  Cardiac-she is not complaining of chest pain.  GI-no complaints of abdominal pain-nausea vomiting diarrhea or constipation   Physical exam.   Temperature 98.1 pulse 80 respirations 20 blood pressure 126/86 weight is relatively stable at 84.4.  In general this is a well-nourished elderly female in no distress lying in bed--she is agitated with exam .  Her skin is warm and dry .  Head ears eyes nose mouth and throat-she has prescription She has erythematous conjunctiva bilaterally visual acuity appears grossly intact pupils are equal round reactive to light extraocular movements are  intact--nursing staff has noted some clear drainage-she appears to have a minimal amount of matting at the medial margins.  Chest -no sign of labored breathing however difficult exam secondary to patient agitation-I cannot really hear any congestion however.  Heart regular  rate and rhythm without murmur gallop or rub--- somewhat distant heart sounds complicated with patient agitation  .  Abdomen is soft nontender with positive bowel sounds.  Muscle skeletal --moves all limbs x4 at baseline-- did not note any deformities-continues to ambulate in her wheelchair  . Neurologic-I do not see any focal deficits cranial nerves grossly intact speech is clear--moves all her extremities at baseline.  Psychiatric oriented to self only-was not real cooperative with exam  .  Labs    11/26/2012.  Hemoglobin A1c 6.0.  11/10/2012.  TSH-3.605.  11/10/2012.  WBC 5.5 hemoglobin 11.8 platelets 162.  Sodium 138 potassium 4.0 BUN 12 creatinine 0.82.  Liver function tests within normal limits except albumin of 3.1.  08/11/2012.  Hemoglobin A1c 5.8.  07/11/2012.  Cholesterol 238 triglycerides 88 HDL 60 LDL 160.  07/10/2012.  Sodium 138 potassium 4.3 BUN 17 tract 0.85.  Marland Kitchen  06/15/2012.  T4-7.7----T3-2 .5-.  06/02/2012.  Hemoglobin A1c 6.5.  05/14/2012.  WBC 4.9 hemoglobin 11.1 platelets 188.  Sodium 137 potassium 3.8 BUN 10 creatinine 0.74.  02/25/2012.  Liver function tests within normal limits.  July 2013.  Cholesterol 192 triglycerides 148 HDL 47 LDL 1:15   Assessment and plan  #1-conjunctivitis-at this point appears to be allergic-Will treat with Patanol eyedrops 1 drop twice a day for 7 days and monitor-also appears to have some associated allergy symptoms with clear drainage from the nose-- will treat with Claritin 10 mg daily for 7 days--  Monitor vital signs pulse ox every shift for 72 hours to keep tabs on this    s.  Number -2- dementia-this appears to be slowly progressing continues to function fairly well at current setting. Continue supportive care-she is followed by psych services--he does have Ativan as needed for agitation which appears to be needed  #3-hypertension -- continues on Lopressor Norvasc and clonidine-there is some variability here I  suspect largely agitation related we'll continue to monitor for now  #4-diabetes type 2-appears stable on Actos--CBG's  run in the 80s to l mid 100s--update hemoglobin A1c.  #5 osteoporosis continues on calcium with vitamin D.  #6 seizure disorder-this has been stable for an extended period of time she is on Keppra.  #7 CHF-this is stable Aldactone discontinued in the past secondary to renal insufficiency-clinically she appears to be stable.--We'll update metabolic panel  #4-YHCWCBJS artery disease-this is stable she is on aspirin.  #10 anemia-this has been stable --thought likely chronic disease-we'll update CBC  #11-history of abdominal aortic aneurysm-. An ultrasound was ordered however it was nondiagnostic obscured by bowel gas suggested CT follow which was ordered--unfortunately patient was quite agitated and this could not be done..-- at this point do not see the value of further diagnostic imaging patient would be a very poor candidate for any aggressive workup and she has great anxiety with attempts at obtaining a CT scan..--this has been  discussed with Dr. Dellia Nims via phone   12 -- depression-t on Lexapro.-- is followed by psych services  Number 11-mildly elevated TSH --recent TSH within normal limits  . #13-weight loss--this appears to have stabilized recently-she is on supplements-I suspect at some point in the future this will continue to be a challenge with her progressing dementia  [id number]

## 2013-04-30 ENCOUNTER — Encounter: Payer: Self-pay | Admitting: Internal Medicine

## 2013-04-30 DIAGNOSIS — H109 Unspecified conjunctivitis: Secondary | ICD-10-CM | POA: Insufficient documentation

## 2013-04-30 DIAGNOSIS — J309 Allergic rhinitis, unspecified: Secondary | ICD-10-CM | POA: Insufficient documentation

## 2013-05-04 LAB — GLUCOSE, CAPILLARY
GLUCOSE-CAPILLARY: 121 mg/dL — AB (ref 70–99)
GLUCOSE-CAPILLARY: 134 mg/dL — AB (ref 70–99)

## 2013-05-08 LAB — GLUCOSE, CAPILLARY
Glucose-Capillary: 93 mg/dL (ref 70–99)
Glucose-Capillary: 99 mg/dL (ref 70–99)

## 2013-05-11 LAB — GLUCOSE, CAPILLARY
GLUCOSE-CAPILLARY: 133 mg/dL — AB (ref 70–99)
Glucose-Capillary: 84 mg/dL (ref 70–99)

## 2013-05-15 LAB — GLUCOSE, CAPILLARY: Glucose-Capillary: 81 mg/dL (ref 70–99)

## 2013-05-18 LAB — GLUCOSE, CAPILLARY
GLUCOSE-CAPILLARY: 78 mg/dL (ref 70–99)
GLUCOSE-CAPILLARY: 129 mg/dL — AB (ref 70–99)

## 2013-05-22 LAB — GLUCOSE, CAPILLARY
Glucose-Capillary: 83 mg/dL (ref 70–99)
Glucose-Capillary: 150 mg/dL — ABNORMAL HIGH (ref 70–99)

## 2013-05-25 LAB — GLUCOSE, CAPILLARY: Glucose-Capillary: 90 mg/dL (ref 70–99)

## 2013-05-29 LAB — GLUCOSE, CAPILLARY
GLUCOSE-CAPILLARY: 100 mg/dL — AB (ref 70–99)
Glucose-Capillary: 79 mg/dL (ref 70–99)

## 2013-06-01 LAB — GLUCOSE, CAPILLARY: Glucose-Capillary: 90 mg/dL (ref 70–99)

## 2013-06-05 LAB — GLUCOSE, CAPILLARY
GLUCOSE-CAPILLARY: 79 mg/dL (ref 70–99)
Glucose-Capillary: 82 mg/dL (ref 70–99)

## 2013-06-08 LAB — GLUCOSE, CAPILLARY
GLUCOSE-CAPILLARY: 120 mg/dL — AB (ref 70–99)
Glucose-Capillary: 95 mg/dL (ref 70–99)

## 2013-06-12 LAB — GLUCOSE, CAPILLARY: GLUCOSE-CAPILLARY: 84 mg/dL (ref 70–99)

## 2013-06-15 LAB — GLUCOSE, CAPILLARY
GLUCOSE-CAPILLARY: 163 mg/dL — AB (ref 70–99)
Glucose-Capillary: 90 mg/dL (ref 70–99)

## 2013-06-19 LAB — GLUCOSE, CAPILLARY: Glucose-Capillary: 84 mg/dL (ref 70–99)

## 2013-06-21 LAB — GLUCOSE, CAPILLARY: GLUCOSE-CAPILLARY: 140 mg/dL — AB (ref 70–99)

## 2013-06-22 LAB — GLUCOSE, CAPILLARY
GLUCOSE-CAPILLARY: 94 mg/dL (ref 70–99)
Glucose-Capillary: 96 mg/dL (ref 70–99)

## 2013-06-24 LAB — GLUCOSE, CAPILLARY: Glucose-Capillary: 137 mg/dL — ABNORMAL HIGH (ref 70–99)

## 2013-06-26 LAB — GLUCOSE, CAPILLARY
GLUCOSE-CAPILLARY: 200 mg/dL — AB (ref 70–99)
Glucose-Capillary: 68 mg/dL — ABNORMAL LOW (ref 70–99)
Glucose-Capillary: 77 mg/dL (ref 70–99)

## 2013-06-29 LAB — GLUCOSE, CAPILLARY
GLUCOSE-CAPILLARY: 86 mg/dL (ref 70–99)
Glucose-Capillary: 120 mg/dL — ABNORMAL HIGH (ref 70–99)

## 2013-07-01 LAB — GLUCOSE, CAPILLARY: Glucose-Capillary: 72 mg/dL (ref 70–99)

## 2013-07-03 LAB — GLUCOSE, CAPILLARY: GLUCOSE-CAPILLARY: 67 mg/dL — AB (ref 70–99)

## 2013-07-06 LAB — GLUCOSE, CAPILLARY
GLUCOSE-CAPILLARY: 164 mg/dL — AB (ref 70–99)
GLUCOSE-CAPILLARY: 168 mg/dL — AB (ref 70–99)
Glucose-Capillary: 81 mg/dL (ref 70–99)

## 2013-07-10 LAB — GLUCOSE, CAPILLARY: GLUCOSE-CAPILLARY: 181 mg/dL — AB (ref 70–99)

## 2013-07-12 LAB — GLUCOSE, CAPILLARY: Glucose-Capillary: 103 mg/dL — ABNORMAL HIGH (ref 70–99)

## 2013-07-13 LAB — GLUCOSE, CAPILLARY: Glucose-Capillary: 124 mg/dL — ABNORMAL HIGH (ref 70–99)

## 2013-07-17 LAB — GLUCOSE, CAPILLARY
Glucose-Capillary: 65 mg/dL — ABNORMAL LOW (ref 70–99)
Glucose-Capillary: 77 mg/dL (ref 70–99)
Glucose-Capillary: 205 mg/dL — ABNORMAL HIGH (ref 70–99)

## 2013-07-20 LAB — GLUCOSE, CAPILLARY: Glucose-Capillary: 73 mg/dL (ref 70–99)

## 2013-07-24 LAB — GLUCOSE, CAPILLARY
Glucose-Capillary: 49 mg/dL — ABNORMAL LOW (ref 70–99)
Glucose-Capillary: 69 mg/dL — ABNORMAL LOW (ref 70–99)
Glucose-Capillary: 73 mg/dL (ref 70–99)
Glucose-Capillary: 70 mg/dL (ref 70–99)

## 2013-07-27 LAB — GLUCOSE, CAPILLARY
GLUCOSE-CAPILLARY: 146 mg/dL — AB (ref 70–99)
Glucose-Capillary: 83 mg/dL (ref 70–99)

## 2013-07-31 LAB — GLUCOSE, CAPILLARY
Glucose-Capillary: 74 mg/dL (ref 70–99)
Glucose-Capillary: 181 mg/dL — ABNORMAL HIGH (ref 70–99)

## 2013-08-03 LAB — GLUCOSE, CAPILLARY: GLUCOSE-CAPILLARY: 94 mg/dL (ref 70–99)

## 2013-08-04 ENCOUNTER — Encounter: Payer: Self-pay | Admitting: Internal Medicine

## 2013-08-04 ENCOUNTER — Non-Acute Institutional Stay (SKILLED_NURSING_FACILITY): Payer: Medicare Other | Admitting: Internal Medicine

## 2013-08-04 DIAGNOSIS — I714 Abdominal aortic aneurysm, without rupture, unspecified: Secondary | ICD-10-CM

## 2013-08-04 DIAGNOSIS — F0390 Unspecified dementia without behavioral disturbance: Secondary | ICD-10-CM

## 2013-08-04 DIAGNOSIS — I25810 Atherosclerosis of coronary artery bypass graft(s) without angina pectoris: Secondary | ICD-10-CM

## 2013-08-04 DIAGNOSIS — M810 Age-related osteoporosis without current pathological fracture: Secondary | ICD-10-CM

## 2013-08-04 DIAGNOSIS — D649 Anemia, unspecified: Secondary | ICD-10-CM

## 2013-08-04 DIAGNOSIS — R569 Unspecified convulsions: Secondary | ICD-10-CM

## 2013-08-04 DIAGNOSIS — E119 Type 2 diabetes mellitus without complications: Secondary | ICD-10-CM

## 2013-08-04 DIAGNOSIS — I10 Essential (primary) hypertension: Secondary | ICD-10-CM

## 2013-08-04 DIAGNOSIS — I509 Heart failure, unspecified: Secondary | ICD-10-CM

## 2013-08-04 LAB — GLUCOSE, CAPILLARY
Glucose-Capillary: 121 mg/dL — ABNORMAL HIGH (ref 70–99)
Glucose-Capillary: 98 mg/dL (ref 70–99)

## 2013-08-04 NOTE — Progress Notes (Signed)
Patient ID: [name], female   DOB: December 10, 1925, 80 y.o.   MRN: [id number]   This is a routine visit.   Level of care skilled.   Facility [facility].    Chief complaint-medical management of dementia hypertension CHF diabetes type 2 seizure disorder history of abdominal aortic aneurysm---    History of present illness   Patient is an elderly resident with the above diagnoses-- her dementia does appear to be progressing.   She has progressive Alzheimer Disease she does ambulate about the facility in her wheelchair is confused-does have occasional periods of agitation    .    She does have a history of hypertension-recent blood pressures 134/74-143/82-this appears to be relatively stable she has in the past had systolic elevations at times I suspect this could be due to her agitation at times She is on clonidine 0.1 mg twice a day as well as Lopressor 75 mg twice a day and Norvasc 10 mg daily .   Back in September it was noted she lost about 5 pounds since June-this appears to have stabilized most recent weight is in the low  80s which is roughly what it was back in September.   Currently her appetite continues to be somewhat spotty she continues on Magic cup twice a day--as well as [place] in a 3 times a day   .   She has a history of diabetes type 2 she is on Actos blood sugars run 49-94 the morning--L4 30 p.m. sugars run 70s-mid 100s- recent hemoglobin A1c in March was 5.9.   She continues on Keppra for a history of seizure disorder there've been no recent seizures.   She has a history of abdominal aortic aneurysm this has been stable per CT scan done back in May of l2013-- an ultrasound ordered last year-- was nondiagnostic because of bowel gas-an unenhanced CT was suggested-this was ordered--but apparently could not be performed secondary to patient agitation--I did discuss this with her husband-I suspect she would be a very poor operative candidate-and husband is in agreement with the  conservative followup here   Patient has no complaints today according to nursing staff continues to be at baseline   .   Family medical social history has been reviewed per history and physical on 08/16/2010.    Medications have been reviewed per MAR.    Review of systems Limited secondary to dementia please see history of present illness.   Gen. Has a fever does not appear to have chills her weight appears to have stabilized somewhat Skin-does not complaining of any itching nursing staff has not noted rashes.   Eyes--staff has not noted any visual changes .   Respiratory --nursing staff has not really noted any increased shortness of breath  Or persistent cough .   Neurologic-history of seizure disorder this has been stable for an extended period of time   Cardiac-she is not complaining of chest pain.   GI-no complaints of abdominal pain-nausea vomiting diarrhea or constipation    Physical exam.   Temperature 97.8 pulse 81 respirations 20 blood pressure 134/74 weight is 81.2  .     Her skin is warm and dry .   Head ears eyes nose mouth and throat-she has prescription lenses--pupils appear reactive although limited exam since shut her eyes with attempted examination  .   Chest -no sign of labored breathing--clear to auscultation .   Heart regular rate and rhythm without murmur gallop or rub--- somewhat distant heart sounds    .  Abdomen is soft nontender with positive bowel sounds.   Muscle skeletal --moves all limbs x4 at baseline-- did not note any deformities-continues to ambulate in her wheelchair   . Neurologic-I do not see any focal deficits cranial nerves grossly intact speech is clear--moves all her extremities at baseline.   Psychiatric oriented to self only  Not -combative today but did have some difficulty following any verbal commands  .   Labs   05/04/2013.  Hemoglobin A1c 5.9.  CBC 3.8 hemoglobin 11.6 platelets 252.  Sodium 143 potassium 4 BUN 9  creatinine 0.76.  Liver function tests within normal limits except albumin of 3.1    11/26/2012.   Hemoglobin A1c 6.0.   11/10/2012.   TSH-3.605.   11/10/2012.   WBC 5.5 hemoglobin 11.8 platelets 162.   Sodium 138 potassium 4.0 BUN 12 creatinine 0.82.   Liver function tests within normal limits except albumin of 3.1.   08/11/2012.   Hemoglobin A1c 5.8.   07/11/2012.   Cholesterol 238 triglycerides 88 HDL 60 LDL 160.   07/10/2012.   Sodium 138 potassium 4.3 BUN 17 tract 0.85.   Marland Kitchen   06/15/2012.   T4-7.7----T3-2 .5-.   06/02/2012.   Hemoglobin A1c 6.5.   05/14/2012.   WBC 4.9 hemoglobin 11.1 platelets 188.   Sodium 137 potassium 3.8 BUN 10 creatinine 0.74.   02/25/2012.   Liver function tests within normal limits.   July 2013.   Cholesterol 192 triglycerides 148 HDL 47 LDL 1:15    Assessment and plan   # .   Number -1- dementia-this appears to be slowly progressing continues to function fairly well at current setting. Continue supportive care-she is followed by psych services--he does have Ativan as needed for agitation which appears to be needed   #2-hypertension -- continues on Lopressor Norvasc and clonidine-there is some variability here I suspect largely agitation related we'll continue to monitor for now   #3-diabetes type 2- on Actos--somewhat concerned with lower CBG readings especially in the morning will discontinue Actos and monitor blood sugars twice a day with log for review--notify provider if CBG greater than 250-update hemoglobin A1c.   #4 osteoporosis continues on calcium with vitamin D.   #5 seizure disorder-this has been stable for an extended period of time she is on Keppra.   #6 CHF-this is stable Aldactone discontinued in the past secondary to renal insufficiency-clinically she appears to be stable.--We'll update metabolic panel   #8-LFYBOFBP artery disease-this is stable she is on aspirin.   #8anemia-this has been stable --thought likely chronic  disease-we'll update CBC   #9-history of abdominal aortic aneurysm-. An ultrasound was ordered however it was nondiagnostic obscured by bowel gas suggested CT follow which was ordered--unfortunately patient was quite agitated and this could not be done..-- at this point do not see the value of further diagnostic imaging patient would be a very poor candidate for any aggressive workup and she has great anxiety with attempts at obtaining a CT scan..--this has been  discussed with Dr. Dellia Nims via phone--and also with her husband who agrees with non aggressive followup    10 -- depression-t on Lexapro.-- is followed by psych services   Number 11-mildly elevated TSH --this appears to have normalized will update TSH   . #12-weight loss--this appears to have stabilized recently-she is on supplements-I suspect at some point in the future this will continue to be a challenge with her progressing dementia       [phone]

## 2013-08-05 LAB — GLUCOSE, CAPILLARY: Glucose-Capillary: 101 mg/dL — ABNORMAL HIGH (ref 70–99)

## 2013-08-06 ENCOUNTER — Encounter: Payer: Self-pay | Admitting: Internal Medicine

## 2013-08-06 ENCOUNTER — Non-Acute Institutional Stay (SKILLED_NURSING_FACILITY): Payer: Medicare Other | Admitting: Internal Medicine

## 2013-08-06 DIAGNOSIS — R7989 Other specified abnormal findings of blood chemistry: Secondary | ICD-10-CM

## 2013-08-06 DIAGNOSIS — R946 Abnormal results of thyroid function studies: Secondary | ICD-10-CM

## 2013-08-06 LAB — GLUCOSE, CAPILLARY: Glucose-Capillary: 89 mg/dL (ref 70–99)

## 2013-08-06 NOTE — Progress Notes (Signed)
Patient ID: [name], female   DOB: Jan 09, 1926, 80 y.o.   MRN: [id number]   This is an acute visit.  Level of care skilled.  Facility [facility].  Chief complaint visit followup elevated TSH.  History of present illness.  Patient is an elderly resident recently seen for a routine visit-it was noted occasionally she will have an elevated TSH and an update TSH was ordered which has come back mildly elevated at 5.963.  She does not have a listed history of hypothyroidism and she is not on amiodarone which would raise suspicions as well.  Clinically she is stable with pulse is largely in the 60-80s-she does have significant dementia her weight has been relatively stable in the low 80s for some time however - clinically appears to be at baseline  I note back in September 2014 her TSH was normal at 3.6-back in April 2014 T4 was 7.17 T3-2 .5   Patient has significant dementia and cannot really give any review of systems again per nursing she appears to be at her baseline ambulates about the facility in her wheelchair and did not have any acute complaints.  Family medical social history as been reviewed.  Most recently progress note 08/04/2013.  Medications have been reviewed per MAR.  Review of systems as stated in history of present illness largely unobtainable secondary to dementia.  Physical exam.  She is afebrile --pulse of 80-respirations 20-last noted blood pressure 134/74 again these are somewhat difficult to obtain secondary to agitation the patient actually would not really let me examine her today-she does have instances of this and this is not unusual.  In general she is in no acute distress ambulating in her wheelchair.  Her skin is warm and dry.  Neurologically she appears to be intact ambulates at baseline again this was very limited exam --renal sign of distress.  Labs.  Two third 2015.  W. BC 4.8 hemoglobin 11.8 platelets 141.  Sodium 138 potassium 4.7 BUN 20  creatinine 0.84.  Hemoglobin A1c 6.1.  JTT-0.177.  Assessment and plan.  #1-elevated TSH-apparently there is some variability here will update a free T3 and T4 Next laboratory draw-expect we'll continue to be some fluctuation here but clinically she appears stable  [id number].

## 2013-08-07 LAB — GLUCOSE, CAPILLARY
GLUCOSE-CAPILLARY: 156 mg/dL — AB (ref 70–99)
GLUCOSE-CAPILLARY: 161 mg/dL — AB (ref 70–99)
Glucose-Capillary: 112 mg/dL — ABNORMAL HIGH (ref 70–99)

## 2013-08-08 LAB — GLUCOSE, CAPILLARY
Glucose-Capillary: 90 mg/dL (ref 70–99)
Glucose-Capillary: 169 mg/dL — ABNORMAL HIGH (ref 70–99)

## 2013-08-09 LAB — GLUCOSE, CAPILLARY
GLUCOSE-CAPILLARY: 187 mg/dL — AB (ref 70–99)
Glucose-Capillary: 84 mg/dL (ref 70–99)

## 2013-08-10 LAB — GLUCOSE, CAPILLARY: Glucose-Capillary: 124 mg/dL — ABNORMAL HIGH (ref 70–99)

## 2013-08-11 LAB — GLUCOSE, CAPILLARY
GLUCOSE-CAPILLARY: 84 mg/dL (ref 70–99)
GLUCOSE-CAPILLARY: 155 mg/dL — AB (ref 70–99)

## 2013-08-12 LAB — GLUCOSE, CAPILLARY
Glucose-Capillary: 71 mg/dL (ref 70–99)
Glucose-Capillary: 158 mg/dL — ABNORMAL HIGH (ref 70–99)

## 2013-08-13 LAB — GLUCOSE, CAPILLARY
GLUCOSE-CAPILLARY: 171 mg/dL — AB (ref 70–99)
Glucose-Capillary: 76 mg/dL (ref 70–99)

## 2013-08-14 LAB — GLUCOSE, CAPILLARY
GLUCOSE-CAPILLARY: 99 mg/dL (ref 70–99)
Glucose-Capillary: 124 mg/dL — ABNORMAL HIGH (ref 70–99)

## 2013-08-16 LAB — GLUCOSE, CAPILLARY: Glucose-Capillary: 126 mg/dL — ABNORMAL HIGH (ref 70–99)

## 2013-08-17 LAB — GLUCOSE, CAPILLARY
Glucose-Capillary: 88 mg/dL (ref 70–99)
Glucose-Capillary: 71 mg/dL (ref 70–99)

## 2013-08-18 LAB — GLUCOSE, CAPILLARY: Glucose-Capillary: 81 mg/dL (ref 70–99)

## 2013-08-19 LAB — GLUCOSE, CAPILLARY
GLUCOSE-CAPILLARY: 108 mg/dL — AB (ref 70–99)
Glucose-Capillary: 129 mg/dL — ABNORMAL HIGH (ref 70–99)
Glucose-Capillary: 77 mg/dL (ref 70–99)

## 2013-08-20 LAB — GLUCOSE, CAPILLARY
GLUCOSE-CAPILLARY: 77 mg/dL (ref 70–99)
GLUCOSE-CAPILLARY: 161 mg/dL — AB (ref 70–99)

## 2013-08-21 LAB — GLUCOSE, CAPILLARY
Glucose-Capillary: 73 mg/dL (ref 70–99)
Glucose-Capillary: 134 mg/dL — ABNORMAL HIGH (ref 70–99)

## 2013-08-22 LAB — GLUCOSE, CAPILLARY: GLUCOSE-CAPILLARY: 135 mg/dL — AB (ref 70–99)

## 2013-08-24 LAB — GLUCOSE, CAPILLARY: Glucose-Capillary: 95 mg/dL (ref 70–99)

## 2013-08-26 LAB — GLUCOSE, CAPILLARY
GLUCOSE-CAPILLARY: 87 mg/dL (ref 70–99)
GLUCOSE-CAPILLARY: 125 mg/dL — AB (ref 70–99)

## 2013-08-27 LAB — GLUCOSE, CAPILLARY: Glucose-Capillary: 80 mg/dL (ref 70–99)

## 2013-08-28 ENCOUNTER — Ambulatory Visit (HOSPITAL_COMMUNITY)
Admit: 2013-08-28 | Discharge: 2013-08-28 | Disposition: A | Discharge status: Home / Self Care | Payer: Medicare Other | Source: Ambulatory Visit | Attending: Internal Medicine | Admitting: Internal Medicine

## 2013-08-28 DIAGNOSIS — M810 Age-related osteoporosis without current pathological fracture: Secondary | ICD-10-CM | POA: Insufficient documentation

## 2013-08-28 DIAGNOSIS — I10 Essential (primary) hypertension: Secondary | ICD-10-CM | POA: Insufficient documentation

## 2013-08-28 DIAGNOSIS — M899 Disorder of bone, unspecified: Secondary | ICD-10-CM | POA: Insufficient documentation

## 2013-08-28 DIAGNOSIS — M25473 Effusion, unspecified ankle: Secondary | ICD-10-CM | POA: Diagnosis present

## 2013-08-28 DIAGNOSIS — E119 Type 2 diabetes mellitus without complications: Secondary | ICD-10-CM | POA: Insufficient documentation

## 2013-08-28 DIAGNOSIS — M25476 Effusion, unspecified foot: Secondary | ICD-10-CM | POA: Diagnosis present

## 2013-08-28 DIAGNOSIS — Z951 Presence of aortocoronary bypass graft: Secondary | ICD-10-CM | POA: Diagnosis not present

## 2013-08-28 DIAGNOSIS — I509 Heart failure, unspecified: Secondary | ICD-10-CM | POA: Insufficient documentation

## 2013-08-28 DIAGNOSIS — F0390 Unspecified dementia without behavioral disturbance: Secondary | ICD-10-CM | POA: Insufficient documentation

## 2013-08-28 DIAGNOSIS — I2510 Atherosclerotic heart disease of native coronary artery without angina pectoris: Secondary | ICD-10-CM | POA: Diagnosis not present

## 2013-08-28 DIAGNOSIS — M949 Disorder of cartilage, unspecified: Secondary | ICD-10-CM

## 2013-09-01 LAB — GLUCOSE, CAPILLARY: GLUCOSE-CAPILLARY: 83 mg/dL (ref 70–99)

## 2013-09-02 LAB — GLUCOSE, CAPILLARY: Glucose-Capillary: 72 mg/dL (ref 70–99)

## 2013-09-03 LAB — GLUCOSE, CAPILLARY: Glucose-Capillary: 85 mg/dL (ref 70–99)

## 2013-09-04 LAB — GLUCOSE, CAPILLARY: Glucose-Capillary: 102 mg/dL — ABNORMAL HIGH (ref 70–99)

## 2013-09-05 LAB — GLUCOSE, CAPILLARY: GLUCOSE-CAPILLARY: 88 mg/dL (ref 70–99)

## 2013-09-08 LAB — GLUCOSE, CAPILLARY
GLUCOSE-CAPILLARY: 90 mg/dL (ref 70–99)
GLUCOSE-CAPILLARY: 80 mg/dL (ref 70–99)

## 2013-09-09 LAB — GLUCOSE, CAPILLARY: Glucose-Capillary: 81 mg/dL (ref 70–99)

## 2013-09-10 LAB — GLUCOSE, CAPILLARY: GLUCOSE-CAPILLARY: 83 mg/dL (ref 70–99)

## 2013-09-11 LAB — GLUCOSE, CAPILLARY: Glucose-Capillary: 88 mg/dL (ref 70–99)

## 2013-09-12 LAB — GLUCOSE, CAPILLARY
GLUCOSE-CAPILLARY: 69 mg/dL — AB (ref 70–99)
Glucose-Capillary: 77 mg/dL (ref 70–99)

## 2013-09-13 LAB — GLUCOSE, CAPILLARY: Glucose-Capillary: 76 mg/dL (ref 70–99)

## 2013-09-14 LAB — GLUCOSE, CAPILLARY: Glucose-Capillary: 77 mg/dL (ref 70–99)

## 2013-09-15 LAB — GLUCOSE, CAPILLARY: Glucose-Capillary: 75 mg/dL (ref 70–99)

## 2013-09-16 LAB — GLUCOSE, CAPILLARY: GLUCOSE-CAPILLARY: 82 mg/dL (ref 70–99)

## 2013-09-17 LAB — GLUCOSE, CAPILLARY: GLUCOSE-CAPILLARY: 77 mg/dL (ref 70–99)

## 2013-09-18 LAB — GLUCOSE, CAPILLARY: Glucose-Capillary: 79 mg/dL (ref 70–99)

## 2013-09-19 LAB — GLUCOSE, CAPILLARY: GLUCOSE-CAPILLARY: 87 mg/dL (ref 70–99)

## 2013-09-20 LAB — GLUCOSE, CAPILLARY: Glucose-Capillary: 76 mg/dL (ref 70–99)

## 2013-09-21 LAB — GLUCOSE, CAPILLARY: Glucose-Capillary: 77 mg/dL (ref 70–99)

## 2013-09-22 LAB — GLUCOSE, CAPILLARY
GLUCOSE-CAPILLARY: 115 mg/dL — AB (ref 70–99)
Glucose-Capillary: 91 mg/dL (ref 70–99)

## 2013-09-23 LAB — GLUCOSE, CAPILLARY: GLUCOSE-CAPILLARY: 85 mg/dL (ref 70–99)

## 2013-09-24 LAB — GLUCOSE, CAPILLARY: Glucose-Capillary: 75 mg/dL (ref 70–99)

## 2013-09-25 LAB — GLUCOSE, CAPILLARY: GLUCOSE-CAPILLARY: 89 mg/dL (ref 70–99)

## 2013-09-26 LAB — GLUCOSE, CAPILLARY: Glucose-Capillary: 78 mg/dL (ref 70–99)

## 2013-09-27 LAB — GLUCOSE, CAPILLARY: Glucose-Capillary: 80 mg/dL (ref 70–99)

## 2013-09-28 LAB — GLUCOSE, CAPILLARY: Glucose-Capillary: 103 mg/dL — ABNORMAL HIGH (ref 70–99)

## 2013-09-29 LAB — GLUCOSE, CAPILLARY
GLUCOSE-CAPILLARY: 186 mg/dL — AB (ref 70–99)
Glucose-Capillary: 83 mg/dL (ref 70–99)

## 2013-09-30 LAB — GLUCOSE, CAPILLARY
GLUCOSE-CAPILLARY: 67 mg/dL — AB (ref 70–99)
GLUCOSE-CAPILLARY: 86 mg/dL (ref 70–99)

## 2013-10-01 LAB — GLUCOSE, CAPILLARY: Glucose-Capillary: 83 mg/dL (ref 70–99)

## 2013-10-02 LAB — GLUCOSE, CAPILLARY: Glucose-Capillary: 79 mg/dL (ref 70–99)

## 2013-10-03 LAB — GLUCOSE, CAPILLARY: Glucose-Capillary: 77 mg/dL (ref 70–99)

## 2013-10-05 LAB — GLUCOSE, CAPILLARY: GLUCOSE-CAPILLARY: 91 mg/dL (ref 70–99)

## 2013-10-06 LAB — GLUCOSE, CAPILLARY: GLUCOSE-CAPILLARY: 107 mg/dL — AB (ref 70–99)

## 2013-10-07 LAB — GLUCOSE, CAPILLARY: Glucose-Capillary: 90 mg/dL (ref 70–99)

## 2013-10-08 LAB — GLUCOSE, CAPILLARY: GLUCOSE-CAPILLARY: 80 mg/dL (ref 70–99)

## 2013-10-09 LAB — GLUCOSE, CAPILLARY: Glucose-Capillary: 86 mg/dL (ref 70–99)

## 2013-10-10 LAB — GLUCOSE, CAPILLARY: GLUCOSE-CAPILLARY: 82 mg/dL (ref 70–99)

## 2013-10-11 LAB — GLUCOSE, CAPILLARY: Glucose-Capillary: 81 mg/dL (ref 70–99)

## 2013-10-12 LAB — GLUCOSE, CAPILLARY: GLUCOSE-CAPILLARY: 75 mg/dL (ref 70–99)

## 2013-10-13 LAB — GLUCOSE, CAPILLARY: Glucose-Capillary: 84 mg/dL (ref 70–99)

## 2013-10-14 LAB — GLUCOSE, CAPILLARY: Glucose-Capillary: 87 mg/dL (ref 70–99)

## 2013-10-15 LAB — GLUCOSE, CAPILLARY: Glucose-Capillary: 81 mg/dL (ref 70–99)

## 2013-10-16 LAB — GLUCOSE, CAPILLARY: Glucose-Capillary: 115 mg/dL — ABNORMAL HIGH (ref 70–99)

## 2013-10-17 LAB — GLUCOSE, CAPILLARY: GLUCOSE-CAPILLARY: 87 mg/dL (ref 70–99)

## 2013-10-19 LAB — GLUCOSE, CAPILLARY: GLUCOSE-CAPILLARY: 87 mg/dL (ref 70–99)

## 2013-10-20 LAB — GLUCOSE, CAPILLARY: Glucose-Capillary: 92 mg/dL (ref 70–99)

## 2013-10-21 LAB — GLUCOSE, CAPILLARY
Glucose-Capillary: 67 mg/dL — ABNORMAL LOW (ref 70–99)
Glucose-Capillary: 73 mg/dL (ref 70–99)

## 2013-10-22 LAB — GLUCOSE, CAPILLARY: Glucose-Capillary: 75 mg/dL (ref 70–99)

## 2013-10-23 LAB — GLUCOSE, CAPILLARY: Glucose-Capillary: 75 mg/dL (ref 70–99)

## 2013-10-24 LAB — GLUCOSE, CAPILLARY: Glucose-Capillary: 83 mg/dL (ref 70–99)

## 2013-10-25 LAB — GLUCOSE, CAPILLARY: Glucose-Capillary: 81 mg/dL (ref 70–99)

## 2013-10-26 LAB — GLUCOSE, CAPILLARY: Glucose-Capillary: 90 mg/dL (ref 70–99)

## 2013-10-27 LAB — GLUCOSE, CAPILLARY: Glucose-Capillary: 94 mg/dL (ref 70–99)

## 2013-10-28 LAB — GLUCOSE, CAPILLARY: Glucose-Capillary: 90 mg/dL (ref 70–99)

## 2013-10-29 LAB — GLUCOSE, CAPILLARY: Glucose-Capillary: 91 mg/dL (ref 70–99)

## 2013-10-30 LAB — GLUCOSE, CAPILLARY: Glucose-Capillary: 95 mg/dL (ref 70–99)

## 2013-10-31 LAB — GLUCOSE, CAPILLARY: GLUCOSE-CAPILLARY: 85 mg/dL (ref 70–99)

## 2013-11-01 LAB — GLUCOSE, CAPILLARY: Glucose-Capillary: 81 mg/dL (ref 70–99)

## 2013-11-02 LAB — GLUCOSE, CAPILLARY: Glucose-Capillary: 107 mg/dL — ABNORMAL HIGH (ref 70–99)

## 2013-11-03 LAB — GLUCOSE, CAPILLARY: Glucose-Capillary: 68 mg/dL — ABNORMAL LOW (ref 70–99)

## 2013-11-04 LAB — GLUCOSE, CAPILLARY: GLUCOSE-CAPILLARY: 79 mg/dL (ref 70–99)

## 2013-11-05 LAB — GLUCOSE, CAPILLARY: GLUCOSE-CAPILLARY: 81 mg/dL (ref 70–99)

## 2013-11-06 LAB — GLUCOSE, CAPILLARY
Glucose-Capillary: 79 mg/dL (ref 70–99)
Glucose-Capillary: 93 mg/dL (ref 70–99)

## 2013-11-07 LAB — GLUCOSE, CAPILLARY: Glucose-Capillary: 73 mg/dL (ref 70–99)

## 2013-11-08 LAB — GLUCOSE, CAPILLARY: GLUCOSE-CAPILLARY: 100 mg/dL — AB (ref 70–99)

## 2013-11-09 LAB — GLUCOSE, CAPILLARY: GLUCOSE-CAPILLARY: 83 mg/dL (ref 70–99)

## 2013-11-10 LAB — GLUCOSE, CAPILLARY: Glucose-Capillary: 76 mg/dL (ref 70–99)

## 2013-11-11 LAB — GLUCOSE, CAPILLARY: GLUCOSE-CAPILLARY: 110 mg/dL — AB (ref 70–99)

## 2013-11-12 LAB — GLUCOSE, CAPILLARY: Glucose-Capillary: 82 mg/dL (ref 70–99)

## 2013-11-13 LAB — GLUCOSE, CAPILLARY: GLUCOSE-CAPILLARY: 80 mg/dL (ref 70–99)

## 2013-11-14 LAB — GLUCOSE, CAPILLARY: Glucose-Capillary: 77 mg/dL (ref 70–99)

## 2013-11-15 LAB — GLUCOSE, CAPILLARY: Glucose-Capillary: 85 mg/dL (ref 70–99)

## 2013-11-17 LAB — GLUCOSE, CAPILLARY: GLUCOSE-CAPILLARY: 89 mg/dL (ref 70–99)

## 2013-11-18 LAB — GLUCOSE, CAPILLARY: Glucose-Capillary: 93 mg/dL (ref 70–99)

## 2013-11-19 LAB — GLUCOSE, CAPILLARY: Glucose-Capillary: 80 mg/dL (ref 70–99)

## 2013-11-20 LAB — GLUCOSE, CAPILLARY: Glucose-Capillary: 95 mg/dL (ref 70–99)

## 2013-11-21 LAB — GLUCOSE, CAPILLARY: Glucose-Capillary: 83 mg/dL (ref 70–99)

## 2013-11-22 LAB — GLUCOSE, CAPILLARY: GLUCOSE-CAPILLARY: 85 mg/dL (ref 70–99)

## 2013-11-23 LAB — GLUCOSE, CAPILLARY: GLUCOSE-CAPILLARY: 83 mg/dL (ref 70–99)

## 2013-11-24 LAB — GLUCOSE, CAPILLARY: Glucose-Capillary: 131 mg/dL — ABNORMAL HIGH (ref 70–99)

## 2013-11-25 LAB — GLUCOSE, CAPILLARY: GLUCOSE-CAPILLARY: 76 mg/dL (ref 70–99)

## 2013-11-26 LAB — GLUCOSE, CAPILLARY: Glucose-Capillary: 81 mg/dL (ref 70–99)

## 2013-11-27 LAB — GLUCOSE, CAPILLARY: GLUCOSE-CAPILLARY: 85 mg/dL (ref 70–99)

## 2013-11-28 LAB — GLUCOSE, CAPILLARY: Glucose-Capillary: 90 mg/dL (ref 70–99)

## 2013-11-29 LAB — GLUCOSE, CAPILLARY: Glucose-Capillary: 82 mg/dL (ref 70–99)

## 2013-11-30 LAB — GLUCOSE, CAPILLARY: Glucose-Capillary: 91 mg/dL (ref 70–99)

## 2013-12-01 LAB — GLUCOSE, CAPILLARY: Glucose-Capillary: 79 mg/dL (ref 70–99)

## 2013-12-02 LAB — GLUCOSE, CAPILLARY: Glucose-Capillary: 76 mg/dL (ref 70–99)

## 2013-12-03 LAB — GLUCOSE, CAPILLARY: GLUCOSE-CAPILLARY: 83 mg/dL (ref 70–99)

## 2013-12-05 LAB — GLUCOSE, CAPILLARY
GLUCOSE-CAPILLARY: 79 mg/dL (ref 70–99)
GLUCOSE-CAPILLARY: 78 mg/dL (ref 70–99)

## 2013-12-06 LAB — GLUCOSE, CAPILLARY: Glucose-Capillary: 72 mg/dL (ref 70–99)

## 2013-12-07 LAB — GLUCOSE, CAPILLARY: Glucose-Capillary: 75 mg/dL (ref 70–99)

## 2013-12-08 LAB — GLUCOSE, CAPILLARY: GLUCOSE-CAPILLARY: 76 mg/dL (ref 70–99)

## 2013-12-09 LAB — GLUCOSE, CAPILLARY: Glucose-Capillary: 74 mg/dL (ref 70–99)

## 2013-12-10 LAB — GLUCOSE, CAPILLARY: Glucose-Capillary: 76 mg/dL (ref 70–99)

## 2013-12-12 LAB — GLUCOSE, CAPILLARY: Glucose-Capillary: 153 mg/dL — ABNORMAL HIGH (ref 70–99)

## 2013-12-13 LAB — GLUCOSE, CAPILLARY
Glucose-Capillary: 90 mg/dL (ref 70–99)
Glucose-Capillary: 74 mg/dL (ref 70–99)
Glucose-Capillary: 82 mg/dL (ref 70–99)

## 2013-12-14 LAB — GLUCOSE, CAPILLARY: GLUCOSE-CAPILLARY: 86 mg/dL (ref 70–99)

## 2013-12-16 LAB — GLUCOSE, CAPILLARY
GLUCOSE-CAPILLARY: 95 mg/dL (ref 70–99)
Glucose-Capillary: 84 mg/dL (ref 70–99)

## 2013-12-17 LAB — GLUCOSE, CAPILLARY: Glucose-Capillary: 88 mg/dL (ref 70–99)

## 2013-12-18 LAB — GLUCOSE, CAPILLARY: Glucose-Capillary: 85 mg/dL (ref 70–99)

## 2013-12-20 LAB — GLUCOSE, CAPILLARY
GLUCOSE-CAPILLARY: 99 mg/dL (ref 70–99)
GLUCOSE-CAPILLARY: 87 mg/dL (ref 70–99)

## 2013-12-21 LAB — GLUCOSE, CAPILLARY: Glucose-Capillary: 98 mg/dL (ref 70–99)

## 2013-12-22 LAB — GLUCOSE, CAPILLARY: Glucose-Capillary: 84 mg/dL (ref 70–99)

## 2013-12-23 LAB — GLUCOSE, CAPILLARY: GLUCOSE-CAPILLARY: 96 mg/dL (ref 70–99)

## 2013-12-24 LAB — GLUCOSE, CAPILLARY: Glucose-Capillary: 94 mg/dL (ref 70–99)

## 2013-12-28 LAB — GLUCOSE, CAPILLARY: Glucose-Capillary: 130 mg/dL — ABNORMAL HIGH (ref 70–99)

## 2013-12-29 LAB — GLUCOSE, CAPILLARY: Glucose-Capillary: 83 mg/dL (ref 70–99)

## 2013-12-30 LAB — GLUCOSE, CAPILLARY: GLUCOSE-CAPILLARY: 100 mg/dL — AB (ref 70–99)

## 2014-01-01 LAB — GLUCOSE, CAPILLARY
GLUCOSE-CAPILLARY: 90 mg/dL (ref 70–99)
Glucose-Capillary: 82 mg/dL (ref 70–99)

## 2014-01-03 LAB — GLUCOSE, CAPILLARY
GLUCOSE-CAPILLARY: 93 mg/dL (ref 70–99)
Glucose-Capillary: 85 mg/dL (ref 70–99)

## 2014-01-04 LAB — GLUCOSE, CAPILLARY: Glucose-Capillary: 96 mg/dL (ref 70–99)

## 2014-01-05 LAB — GLUCOSE, CAPILLARY: Glucose-Capillary: 89 mg/dL (ref 70–99)

## 2014-01-06 LAB — GLUCOSE, CAPILLARY: GLUCOSE-CAPILLARY: 82 mg/dL (ref 70–99)

## 2014-01-07 LAB — GLUCOSE, CAPILLARY: Glucose-Capillary: 80 mg/dL (ref 70–99)

## 2014-01-08 LAB — GLUCOSE, CAPILLARY: GLUCOSE-CAPILLARY: 80 mg/dL (ref 70–99)

## 2014-01-09 LAB — GLUCOSE, CAPILLARY: GLUCOSE-CAPILLARY: 103 mg/dL — AB (ref 70–99)

## 2014-01-10 LAB — GLUCOSE, CAPILLARY: GLUCOSE-CAPILLARY: 102 mg/dL — AB (ref 70–99)

## 2014-01-11 LAB — GLUCOSE, CAPILLARY: GLUCOSE-CAPILLARY: 92 mg/dL (ref 70–99)

## 2014-01-13 ENCOUNTER — Encounter: Payer: Self-pay | Admitting: Internal Medicine

## 2014-01-13 ENCOUNTER — Non-Acute Institutional Stay (SKILLED_NURSING_FACILITY): Payer: Medicare Other | Admitting: Internal Medicine

## 2014-01-13 DIAGNOSIS — I714 Abdominal aortic aneurysm, without rupture, unspecified: Secondary | ICD-10-CM

## 2014-01-13 DIAGNOSIS — I10 Essential (primary) hypertension: Secondary | ICD-10-CM

## 2014-01-13 DIAGNOSIS — F0391 Unspecified dementia with behavioral disturbance: Secondary | ICD-10-CM

## 2014-01-13 DIAGNOSIS — M810 Age-related osteoporosis without current pathological fracture: Secondary | ICD-10-CM

## 2014-01-13 DIAGNOSIS — I509 Heart failure, unspecified: Secondary | ICD-10-CM

## 2014-01-13 DIAGNOSIS — E119 Type 2 diabetes mellitus without complications: Secondary | ICD-10-CM

## 2014-01-13 DIAGNOSIS — R7303 Prediabetes: Secondary | ICD-10-CM | POA: Insufficient documentation

## 2014-01-13 LAB — GLUCOSE, CAPILLARY
GLUCOSE-CAPILLARY: 134 mg/dL — AB (ref 70–99)
Glucose-Capillary: 81 mg/dL (ref 70–99)

## 2014-01-13 NOTE — Progress Notes (Signed)
Patient ID: [name], female   DOB: 12/26/1925, 80 y.o.   MRN: [id number]   Level of care skilled.  Facility [facility].    Chief complaint-medical management of dementia hypertension CHF diabetes type 2 seizure disorder history of abdominal aortic aneurysm---   History of present illness  Patient is an elderly resident with the above diagnoses-- her dementia does appear to be progressing--although nursing staff says she's been relatively stable-she has somewhat of a spotty appetite she is on supplements and weight has been stable.  She has progressive Alzheimer Disease she does ambulate about the facility in her wheelchair is confused-does have occasional periods of agitation--and exam was somewhat difficult this afternoon because of that however she appeared to be at baseline    .    She does have a history of hypertension-recent blood pressures 152/51-136/52-this appears to be relatively stable she has in the past had systolic elevations at times I suspect this could be due to her agitation at times She is on clonidine 0.1 mg twice a day as well as Lopressor 75 mg twice a day and Norvasc 10 mg daily .  Back in September it was noted she lost about 5 pounds since June-this appears to have stabilized most recent weight is in the low 80s which is roughly what it was back in [place] last year.  Currently her appetite continues to be somewhat spotty she continues on Magic cup twice a day--as well asPro-stat  .  She has a history of diabetes type 2 she was on Actos but that was discontinued secondary to concerns about some lower blood sugars--her sugars run largely in the 80s-low 100s on no medication    She continues on Keppra for a history of seizure disorder there've been no recent seizures.  She has a history of abdominal aortic aneurysm this has been stable per CT scan done back in May of l2013-- an ultrasound ordered last year-- was nondiagnostic because of bowel  gas-an unenhanced CT was suggested-this was ordered--but apparently could not be performed secondary to patient agitation--I did discuss this with her husband-I suspect she would be a very poor operative candidate-and husband is in agreement with the conservative followup here  Patient has no complaints today according to nursing staff continues to be at baseline  .  Family medical social history has been reviewed per history and physical on 08/16/2010.   Medications have been reviewed per MAR.   Review of systems--- Limited secondary to dementia please see history of present illness.  Gen. Has a fever does not appear to have chills her weight appears to have stabilized somewhat Skin-does not complaining of any itching nursing staff has not noted rashes.   Eyes--staff has not noted any visual changes .  Respiratory --nursing staff has not really noted any increased shortness of breath Or persistent cough .  Neurologic-history of seizure disorder this has been stable for an extended period of time  Cardiac-she is not complaining of chest pain.  GI-no complaints of abdominal pain-nausea vomiting diarrhea or constipation    Physical exam.   Patient is afebrile pulse is 60 respirations 18 blood pressure 152/51-136/52 in this range  In general this is a somewhat frail elderly female in no distress but agitated with exam  .    Her skin is warm and dry .  Head ears eyes nose mouth and throat-she has prescription lenses--pupils appear reactive although limited exam since shut her eyes with attempted examination  .  Chest -  no sign of labored breathing--clear to auscultation .  Heart radial pulses regular-I could not really auscultate resident secondary to agitation  .  Abdomen - cannot really examine secondary to patient being agitated at the time  Muscle skeletal --moves all limbs x4 at baseline-- did not note any deformities-continues to ambulate in  her wheelchair  . Neurologic-I do not see any focal deficits cranial nerves grossly intact speech is clear--moves all her extremities at baseline.  Psychiatric oriented to self only --is agitated with exam today -- this does happen at times-  .  Labs  08/05/2013.  WBC 4.8 hemoglobin 11.8 platelets 141.  Sodium 138 potassium 4.7 BUN 20 creatinine 0.84.  Hemoglobin A1c 6.1 TSH 5.963  08/10/2013 . T4-0.96--T3-2 .5  .    05/04/2013.  Hemoglobin A1c 5.9.  CBC 3.8 hemoglobin 11.6 platelets 252.  Sodium 143 potassium 4 BUN 9 creatinine 0.76.  Liver function tests within normal limits except albumin of 3.1    11/26/2012.  Hemoglobin A1c 6.0.  11/10/2012.  TSH-3.605.  11/10/2012.  WBC 5.5 hemoglobin 11.8 platelets 162.  Sodium 138 potassium 4.0 BUN 12 creatinine 0.82.  Liver function tests within normal limits except albumin of 3.1.  08/11/2012.  Hemoglobin A1c 5.8.  07/11/2012.  Cholesterol 238 triglycerides 88 HDL 60 LDL 160.  07/10/2012.  Sodium 138 potassium 4.3 BUN 17 tract 0.85.  Marland Kitchen  06/15/2012.  T4-7.7----T3-2 .5-.  06/02/2012.  Hemoglobin A1c 6.5.  05/14/2012.  WBC 4.9 hemoglobin 11.1 platelets 188.  Sodium 137 potassium 3.8 BUN 10 creatinine 0.74.  02/25/2012.  Liver function tests within normal limits.  July 2013.  Cholesterol 192 triglycerides 148 HDL 47 LDL 1:15    Assessment and plan  # .  Number -1- dementia-this appears to be slowly progressing continues to function fairly well at current setting. Continue supportive care-she is followed by psych services-has occasional periods of agitation but apparently this is been relatively stable and psychiatric services has actually discontinued her Ativan this will have to be monitored-   #2-hypertension -- continues on Lopressor Norvasc and clonidine-there is some variability here I suspect largely agitation related we'll continue to  monitor for now   #3-diabetes type 2- not on any medication nonetheless her sugars appear to be satisfactory   #4 osteoporosis continues on calcium with vitamin D.--check vitamin D level   #5 seizure disorder-this has been stable for an extended period of time she is on Keppra.   #6 CHF-this is stable Aldactone discontinued in the past secondary to renal insufficiency-clinically she appears to be stable.--We'll update metabolic panel   #3-OVFIEPPI artery disease-this is stable she is on aspirin.   #8anemia-this has been stable --thought likely chronic disease-we'll update CBC   #9-history of abdominal aortic aneurysm-. An ultrasound was ordered however it was nondiagnostic obscured by bowel gas suggested CT follow which was ordered--unfortunately patient was quite agitated and this could not be done..-- at this point do not see the value of further diagnostic imaging patient would be a very poor candidate for any aggressive workup and she has great anxiety with attempts at obtaining a CT scan..--this has been discussed with Dr. Dellia Nims via phone--and also with her husband who agrees with non aggressive followup    10 -- depression-t on Lexapro.-- is followed by psych services   Number 11-mildly elevated TSH --T3 T4 appeared to be within normal range will update a TSH   . #12-weight loss--this appears to have stabilized recently-she is on supplements-I suspect at some point  in the future this will continue to be a challenge with her progressing dementia--Will update a CMP  [phone]

## 2014-01-14 LAB — GLUCOSE, CAPILLARY: Glucose-Capillary: 73 mg/dL (ref 70–99)

## 2014-01-15 LAB — GLUCOSE, CAPILLARY: GLUCOSE-CAPILLARY: 95 mg/dL (ref 70–99)

## 2014-01-16 LAB — GLUCOSE, CAPILLARY: Glucose-Capillary: 107 mg/dL — ABNORMAL HIGH (ref 70–99)

## 2014-01-17 LAB — GLUCOSE, CAPILLARY: Glucose-Capillary: 91 mg/dL (ref 70–99)

## 2014-01-18 LAB — GLUCOSE, CAPILLARY: Glucose-Capillary: 113 mg/dL — ABNORMAL HIGH (ref 70–99)

## 2014-01-19 LAB — GLUCOSE, CAPILLARY: Glucose-Capillary: 82 mg/dL (ref 70–99)

## 2014-01-20 LAB — GLUCOSE, CAPILLARY: GLUCOSE-CAPILLARY: 87 mg/dL (ref 70–99)

## 2014-01-22 LAB — GLUCOSE, CAPILLARY
GLUCOSE-CAPILLARY: 91 mg/dL (ref 70–99)
Glucose-Capillary: 78 mg/dL (ref 70–99)

## 2014-01-23 LAB — GLUCOSE, CAPILLARY: Glucose-Capillary: 72 mg/dL (ref 70–99)

## 2014-01-24 LAB — GLUCOSE, CAPILLARY: Glucose-Capillary: 85 mg/dL (ref 70–99)

## 2014-01-25 LAB — GLUCOSE, CAPILLARY: GLUCOSE-CAPILLARY: 74 mg/dL (ref 70–99)

## 2014-01-26 LAB — GLUCOSE, CAPILLARY: GLUCOSE-CAPILLARY: 96 mg/dL (ref 70–99)

## 2014-01-27 LAB — GLUCOSE, CAPILLARY: Glucose-Capillary: 140 mg/dL — ABNORMAL HIGH (ref 70–99)

## 2014-01-28 LAB — GLUCOSE, CAPILLARY: Glucose-Capillary: 103 mg/dL — ABNORMAL HIGH (ref 70–99)

## 2014-01-30 LAB — GLUCOSE, CAPILLARY: Glucose-Capillary: 103 mg/dL — ABNORMAL HIGH (ref 70–99)

## 2014-01-31 LAB — GLUCOSE, CAPILLARY: Glucose-Capillary: 95 mg/dL (ref 70–99)

## 2014-02-01 LAB — GLUCOSE, CAPILLARY: Glucose-Capillary: 97 mg/dL (ref 70–99)

## 2014-02-02 LAB — GLUCOSE, CAPILLARY: Glucose-Capillary: 84 mg/dL (ref 70–99)

## 2014-02-03 LAB — GLUCOSE, CAPILLARY: Glucose-Capillary: 90 mg/dL (ref 70–99)

## 2014-02-04 LAB — GLUCOSE, CAPILLARY: Glucose-Capillary: 96 mg/dL (ref 70–99)

## 2014-02-05 LAB — GLUCOSE, CAPILLARY: GLUCOSE-CAPILLARY: 83 mg/dL (ref 70–99)

## 2014-02-06 LAB — GLUCOSE, CAPILLARY: Glucose-Capillary: 76 mg/dL (ref 70–99)

## 2014-02-07 LAB — GLUCOSE, CAPILLARY: Glucose-Capillary: 79 mg/dL (ref 70–99)

## 2014-02-08 LAB — GLUCOSE, CAPILLARY: Glucose-Capillary: 77 mg/dL (ref 70–99)

## 2014-02-09 LAB — GLUCOSE, CAPILLARY: Glucose-Capillary: 83 mg/dL (ref 70–99)

## 2014-02-10 LAB — GLUCOSE, CAPILLARY: GLUCOSE-CAPILLARY: 87 mg/dL (ref 70–99)

## 2014-02-11 LAB — GLUCOSE, CAPILLARY: GLUCOSE-CAPILLARY: 86 mg/dL (ref 70–99)

## 2014-02-12 LAB — GLUCOSE, CAPILLARY: Glucose-Capillary: 77 mg/dL (ref 70–99)

## 2014-02-13 LAB — GLUCOSE, CAPILLARY: Glucose-Capillary: 66 mg/dL — ABNORMAL LOW (ref 70–99)

## 2014-02-15 LAB — GLUCOSE, CAPILLARY
Glucose-Capillary: 75 mg/dL (ref 70–99)
Glucose-Capillary: 92 mg/dL (ref 70–99)

## 2014-02-16 LAB — GLUCOSE, CAPILLARY: GLUCOSE-CAPILLARY: 69 mg/dL — AB (ref 70–99)

## 2014-02-17 LAB — GLUCOSE, CAPILLARY: Glucose-Capillary: 102 mg/dL — ABNORMAL HIGH (ref 70–99)

## 2014-02-18 LAB — GLUCOSE, CAPILLARY: Glucose-Capillary: 95 mg/dL (ref 70–99)

## 2014-02-19 LAB — GLUCOSE, CAPILLARY: Glucose-Capillary: 81 mg/dL (ref 70–99)

## 2014-02-20 LAB — GLUCOSE, CAPILLARY: Glucose-Capillary: 89 mg/dL (ref 70–99)

## 2014-02-21 LAB — GLUCOSE, CAPILLARY: GLUCOSE-CAPILLARY: 126 mg/dL — AB (ref 70–99)

## 2014-02-22 LAB — GLUCOSE, CAPILLARY: GLUCOSE-CAPILLARY: 75 mg/dL (ref 70–99)

## 2014-02-23 LAB — GLUCOSE, CAPILLARY: Glucose-Capillary: 79 mg/dL (ref 70–99)

## 2014-02-24 LAB — GLUCOSE, CAPILLARY: Glucose-Capillary: 92 mg/dL (ref 70–99)

## 2014-02-25 LAB — GLUCOSE, CAPILLARY: Glucose-Capillary: 89 mg/dL (ref 70–99)

## 2014-02-26 LAB — GLUCOSE, CAPILLARY: GLUCOSE-CAPILLARY: 88 mg/dL (ref 70–99)

## 2014-02-27 LAB — GLUCOSE, CAPILLARY: GLUCOSE-CAPILLARY: 80 mg/dL (ref 70–99)

## 2014-02-28 LAB — GLUCOSE, CAPILLARY: GLUCOSE-CAPILLARY: 76 mg/dL (ref 70–99)

## 2014-03-01 LAB — GLUCOSE, CAPILLARY: GLUCOSE-CAPILLARY: 70 mg/dL (ref 70–99)

## 2014-03-02 LAB — GLUCOSE, CAPILLARY: Glucose-Capillary: 86 mg/dL (ref 70–99)

## 2014-03-03 LAB — GLUCOSE, CAPILLARY: GLUCOSE-CAPILLARY: 81 mg/dL (ref 70–99)

## 2014-03-04 LAB — GLUCOSE, CAPILLARY: GLUCOSE-CAPILLARY: 95 mg/dL (ref 70–99)

## 2014-03-05 LAB — GLUCOSE, CAPILLARY: Glucose-Capillary: 104 mg/dL — ABNORMAL HIGH (ref 70–99)

## 2014-03-06 LAB — GLUCOSE, CAPILLARY: Glucose-Capillary: 74 mg/dL (ref 70–99)

## 2014-03-07 LAB — GLUCOSE, CAPILLARY
GLUCOSE-CAPILLARY: 64 mg/dL — AB (ref 70–99)
Glucose-Capillary: 91 mg/dL (ref 70–99)

## 2014-03-08 LAB — GLUCOSE, CAPILLARY: Glucose-Capillary: 76 mg/dL (ref 70–99)

## 2014-03-09 LAB — GLUCOSE, CAPILLARY: GLUCOSE-CAPILLARY: 79 mg/dL (ref 70–99)

## 2014-03-10 LAB — GLUCOSE, CAPILLARY: Glucose-Capillary: 95 mg/dL (ref 70–99)

## 2014-03-11 LAB — GLUCOSE, CAPILLARY: Glucose-Capillary: 74 mg/dL (ref 70–99)

## 2014-03-12 LAB — GLUCOSE, CAPILLARY
Glucose-Capillary: 67 mg/dL — ABNORMAL LOW (ref 70–99)
Glucose-Capillary: 79 mg/dL (ref 70–99)

## 2014-03-13 LAB — GLUCOSE, CAPILLARY: Glucose-Capillary: 84 mg/dL (ref 70–99)

## 2014-03-14 LAB — GLUCOSE, CAPILLARY: GLUCOSE-CAPILLARY: 83 mg/dL (ref 70–99)

## 2014-03-15 LAB — GLUCOSE, CAPILLARY: GLUCOSE-CAPILLARY: 92 mg/dL (ref 70–99)

## 2014-03-16 LAB — GLUCOSE, CAPILLARY: GLUCOSE-CAPILLARY: 92 mg/dL (ref 70–99)

## 2014-03-17 LAB — GLUCOSE, CAPILLARY: Glucose-Capillary: 84 mg/dL (ref 70–99)

## 2014-03-18 LAB — GLUCOSE, CAPILLARY: GLUCOSE-CAPILLARY: 82 mg/dL (ref 70–99)

## 2014-03-19 LAB — GLUCOSE, CAPILLARY: GLUCOSE-CAPILLARY: 81 mg/dL (ref 70–99)

## 2014-03-20 LAB — GLUCOSE, CAPILLARY: Glucose-Capillary: 82 mg/dL (ref 70–99)

## 2014-03-21 LAB — GLUCOSE, CAPILLARY: Glucose-Capillary: 90 mg/dL (ref 70–99)

## 2014-03-22 LAB — GLUCOSE, CAPILLARY: GLUCOSE-CAPILLARY: 80 mg/dL (ref 70–99)

## 2014-03-23 LAB — GLUCOSE, CAPILLARY: Glucose-Capillary: 80 mg/dL (ref 70–99)

## 2014-03-24 ENCOUNTER — Non-Acute Institutional Stay (SKILLED_NURSING_FACILITY): Payer: Medicare Other | Admitting: Internal Medicine

## 2014-03-24 ENCOUNTER — Encounter: Payer: Self-pay | Admitting: Internal Medicine

## 2014-03-24 DIAGNOSIS — I714 Abdominal aortic aneurysm, without rupture, unspecified: Secondary | ICD-10-CM

## 2014-03-24 DIAGNOSIS — F0391 Unspecified dementia with behavioral disturbance: Secondary | ICD-10-CM

## 2014-03-24 DIAGNOSIS — J3089 Other allergic rhinitis: Secondary | ICD-10-CM

## 2014-03-24 DIAGNOSIS — E119 Type 2 diabetes mellitus without complications: Secondary | ICD-10-CM

## 2014-03-24 DIAGNOSIS — I10 Essential (primary) hypertension: Secondary | ICD-10-CM

## 2014-03-24 DIAGNOSIS — R569 Unspecified convulsions: Secondary | ICD-10-CM

## 2014-03-24 LAB — GLUCOSE, CAPILLARY: GLUCOSE-CAPILLARY: 117 mg/dL — AB (ref 70–99)

## 2014-03-24 NOTE — Progress Notes (Signed)
Patient ID: [name], female   DOB: Aug 28, 1925, 81 y.o.   MRN: [id number]  :       Level of care skilled.  Facility [facility].    Chief complaint-medical management of dementia hypertension CHF diabetes type 2 seizure disorder history of abdominal aortic aneurysm---   History of present illness  Patient is an elderly resident with the above diagnoses-- her dementia does appear to be progressing--although nursing staff says she's been relatively stable-she has somewhat of a spotty appetite she is on supplements and weight has been stable.  She has progressive Alzheimer Disease she does ambulate about the facility in her wheelchair is confused-does have occasional periods of agitation--and exam was somewhat difficult because of that however she appeared to be at baseline    .    She does have a history of hypertension-recent blood pressures 132/56-148/63 this appears to be relatively stable she has in the past had systolic elevations at times I suspect this could be due to her agitation at times She is on clonidine 0.1 mg twice a day as well as Lopressor 75 mg twice a day and Norvasc 10 mg daily .  Back in September it was noted she lost about 5 pounds since June-this appears to have stabilized most recent weight is in the low 80s which is roughly what it was back in [place] last year.  Currently her appetite continues to be somewhat spotty she continues on Magic cup twice a day--as well asPro-stat  .  She has a history of diabetes type 2 she was on Actos but that was discontinued secondary to concerns about some lower blood sugars--her sugars run largely in the 80s-low 100s on no medication    She continues on Keppra for a history of seizure disorder there've been no recent seizures.  She has a history of abdominal aortic aneurysm this has been stable per CT scan done back in May of l2013-- an ultrasound ordered last year-- was nondiagnostic because of bowel  gas-an unenhanced CT was suggested-this was ordered--but apparently could not be performed secondary to patient agitation--I did discuss this with her husband-I suspect she would be a very poor operative candidate-and husband is in agreement with the conservative followup here --this was discussed with Dr. Dellia Nims as well Patient has no complaints today according to nursing staff continues to be at baseline  .  Family medical social history has been reviewed per history and physical on 08/16/2010.   Medications have been reviewed per MAR.   Review of systems--- Limited secondary to dementia please see history of present illness.  Gen. Has a fever does not appear to have chills her weight appears to have stabilized  Skin-does not complaining of any itching nursing staff has not noted rashes.   Eyes--staff has not noted any visual changes .  Respiratory --nursing staff has not really noted any increased shortness of breath Or persistent cough .  Neurologic-history of seizure disorder this has been stable for an extended period of time  Cardiac-she is not complaining of chest pain.  GI-no complaints of abdominal pain-nausea vomiting diarrhea or constipation    Physical exam.   Temperature 97.1 pulse 63 respirations 20 blood pressure 132/56 weight is 81.6  In general this is a somewhat frail elderly female in no distress agitated with exam  .    Her skin is warm and dry .  Head ears eyes nose mouth and throat-she has prescription lenses--pupils appear reactive although limited exam since shut  her eyes with attempted examination  .  Chest -no sign of labored breathing--clear to auscultation .  Heart radial pulses regular-I could not really auscultate resident secondary to agitation  .  Abdomen - cannot really examine secondary to patient being agitated at the time  Muscle skeletal --moves all limbs x4 at baseline-- did not note any  deformities-continues to ambulate in her wheelchair  . Neurologic-I do not see any focal deficits cranial nerves grossly intact speech is clear--moves all her extremities at baseline--strength appears to be intact.  Psychiatric oriented to self only --is agitated with exam today -- this does happen at times-  .  Labs  01/15/2014.  TSH-3.19.  Hemoglobin A1c 6.1.  Vitamin D 56.  WBC 5.3 hemoglobin 12.6 platelets 160.  Sodium 140 potassium 4.8 BUN 20 creatinine 0.9.  Liver function tests within normal limits except albumin of 3.3  08/05/2013.  WBC 4.8 hemoglobin 11.8 platelets 141.  Sodium 138 potassium 4.7 BUN 20 creatinine 0.84.  Hemoglobin A1c 6.1 TSH 5.963  08/10/2013 . T4-0.96--T3-2 .5  .    05/04/2013.  Hemoglobin A1c 5.9.  CBC 3.8 hemoglobin 11.6 platelets 252.  Sodium 143 potassium 4 BUN 9 creatinine 0.76.  Liver function tests within normal limits except albumin of 3.1    11/26/2012.  Hemoglobin A1c 6.0.  11/10/2012.  TSH-3.605.  11/10/2012.  WBC 5.5 hemoglobin 11.8 platelets 162.  Sodium 138 potassium 4.0 BUN 12 creatinine 0.82.  Liver function tests within normal limits except albumin of 3.1.  08/11/2012.  Hemoglobin A1c 5.8.  07/11/2012.  Cholesterol 238 triglycerides 88 HDL 60 LDL 160.  07/10/2012.  Sodium 138 potassium 4.3 BUN 17 tract 0.85.  Marland Kitchen  06/15/2012.  T4-7.7----T3-2 .5-.  06/02/2012.  Hemoglobin A1c 6.5.  05/14/2012.  WBC 4.9 hemoglobin 11.1 platelets 188.  Sodium 137 potassium 3.8 BUN 10 creatinine 0.74.  02/25/2012.  Liver function tests within normal limits.  July 2013.  Cholesterol 192 triglycerides 148 HDL 47 LDL 1:15    Assessment and plan  # .  Number -1- dementia-this appears to be slowly progressing continues to function fairly well at current setting. Continue supportive care-she is followed by psych services-has occasional periods of agitation but  apparently this is been relatively stable and psychiatric services has actually discontinued her Ativan this will have to be monitored-   #2-hypertension -- continues on Lopressor Norvasc and clonidine-there is some variability here I suspect largely agitation related we'll continue to monitor for now   #3-diabetes type 2- not on any medication nonetheless her sugars appear to be satisfactory--as well as hemoglobin A1c which is 6.1   #4 osteoporosis continues on calcium with vitamin D.-   #5 seizure disorder-this has been stable for an extended period of time she is on Keppra.   #6 CHF-this is stable Aldactone discontinued in the past secondary to renal insufficiency-clinically she appears to be stable.--We'll update metabolic panel   #1-XBLTJQZE artery disease-this is stable she is on aspirin.   #8anemia-this has been stable --thought likely chronic disease-we'll update CBC   #9-history of abdominal aortic aneurysm-. An ultrasound was ordered however it was nondiagnostic obscured by bowel gas suggested CT follow which was ordered--unfortunately patient was quite agitated and this could not be done..-- at this point do not see the value of further diagnostic imaging patient would be a very poor candidate for any aggressive workup and she has great anxiety with attempts at obtaining a CT scan..--this has been discussed with Dr. Dellia Nims via phone--and also with  her husband who agrees with non aggressive followup    10 -- depression-t on Lexapro.-- is followed by psych services   Number 11-mildly elevated TSH --T3 T4 appeared to be within normal range --recent TSH was within normal limits   . #12-weight loss--this appears to have stabilized recently-she is on supplements-I suspect at some point in the future this will continue to be a challenge with her progressing dementia--Will update a CMP  [id number]

## 2014-03-25 LAB — GLUCOSE, CAPILLARY: GLUCOSE-CAPILLARY: 82 mg/dL (ref 70–99)

## 2014-03-26 LAB — GLUCOSE, CAPILLARY: Glucose-Capillary: 84 mg/dL (ref 70–99)

## 2014-03-27 LAB — GLUCOSE, CAPILLARY: Glucose-Capillary: 91 mg/dL (ref 70–99)

## 2014-03-28 LAB — GLUCOSE, CAPILLARY: Glucose-Capillary: 86 mg/dL (ref 70–99)

## 2014-03-29 LAB — GLUCOSE, CAPILLARY: Glucose-Capillary: 91 mg/dL (ref 70–99)

## 2014-03-30 LAB — GLUCOSE, CAPILLARY: GLUCOSE-CAPILLARY: 71 mg/dL (ref 70–99)

## 2014-03-31 LAB — GLUCOSE, CAPILLARY: Glucose-Capillary: 88 mg/dL (ref 70–99)

## 2014-04-01 LAB — GLUCOSE, CAPILLARY: Glucose-Capillary: 93 mg/dL (ref 70–99)

## 2014-04-02 LAB — GLUCOSE, CAPILLARY: Glucose-Capillary: 79 mg/dL (ref 70–99)

## 2014-04-03 LAB — GLUCOSE, CAPILLARY: Glucose-Capillary: 103 mg/dL — ABNORMAL HIGH (ref 70–99)

## 2014-04-04 LAB — GLUCOSE, CAPILLARY: GLUCOSE-CAPILLARY: 68 mg/dL — AB (ref 70–99)

## 2014-04-05 LAB — GLUCOSE, CAPILLARY: Glucose-Capillary: 64 mg/dL — ABNORMAL LOW (ref 70–99)

## 2014-04-06 LAB — GLUCOSE, CAPILLARY: GLUCOSE-CAPILLARY: 81 mg/dL (ref 70–99)

## 2014-04-07 LAB — GLUCOSE, CAPILLARY: Glucose-Capillary: 73 mg/dL (ref 70–99)

## 2014-04-08 LAB — GLUCOSE, CAPILLARY: GLUCOSE-CAPILLARY: 96 mg/dL (ref 70–99)

## 2014-04-09 LAB — GLUCOSE, CAPILLARY: Glucose-Capillary: 108 mg/dL — ABNORMAL HIGH (ref 70–99)

## 2014-04-10 LAB — GLUCOSE, CAPILLARY: Glucose-Capillary: 82 mg/dL (ref 70–99)

## 2014-04-11 LAB — GLUCOSE, CAPILLARY: Glucose-Capillary: 74 mg/dL (ref 70–99)

## 2014-04-12 LAB — GLUCOSE, CAPILLARY: GLUCOSE-CAPILLARY: 78 mg/dL (ref 70–99)

## 2014-05-05 ENCOUNTER — Encounter: Payer: Self-pay | Admitting: Internal Medicine

## 2014-05-05 ENCOUNTER — Non-Acute Institutional Stay (SKILLED_NURSING_FACILITY): Payer: Medicare Other | Admitting: Internal Medicine

## 2014-05-05 DIAGNOSIS — F0391 Unspecified dementia with behavioral disturbance: Secondary | ICD-10-CM | POA: Diagnosis not present

## 2014-05-05 DIAGNOSIS — M810 Age-related osteoporosis without current pathological fracture: Secondary | ICD-10-CM

## 2014-05-05 DIAGNOSIS — E119 Type 2 diabetes mellitus without complications: Secondary | ICD-10-CM | POA: Diagnosis not present

## 2014-05-05 DIAGNOSIS — R569 Unspecified convulsions: Secondary | ICD-10-CM | POA: Diagnosis not present

## 2014-05-05 DIAGNOSIS — I10 Essential (primary) hypertension: Secondary | ICD-10-CM

## 2014-05-05 NOTE — Progress Notes (Signed)
Patient ID: [name], female   DOB: [birth year], 81 y.o.   MRN: [id number]   Level of care skilled.  Facility [facility]    Chief complaint-medical management of dementia hypertension CHF diabetes type 2 seizure disorder history of abdominal aortic aneurysm---   History of present illness  Patient is an elderly resident with the above diagnoses-- her dementia does appear to be progressing--although nursing staff says she's been relatively stable-she has somewhat of a spotty appetite she is on supplements and weight has been stable.  She has progressive Alzheimer Disease she does ambulate about the facility in her wheelchair is confused-does have occasional periods of agitation--and exam was somewhat difficult because of that however she appeared to be at baseline    .    She does have a history of hypertension-recent blood pressures 144/66-110/76- this appears to be relatively stable she has in the past had systolic elevations at times I suspect this could be due to her agitation at times She is on clonidine 0.1 mg twice a day as well as Lopressor 75 mg twice a day and Norvasc 10 mg daily .  Back in September it was noted she lost about 5 pounds since June-this appears to have stabilized most recent weight is in the low 80s which is roughly what it was back in September of last year.  Currently her appetite continues to be somewhat spotty she continues on Magic cup twice a day--as well asPro-stat  .  She has a history of diabetes type 2 she was on Actos but that was discontinued secondary to concerns about some lower blood sugars--her sugars run largely in the 70-80's   on no medication    She continues on Keppra for a history of seizure disorder there've been no recent seizures.  She has a history of abdominal aortic aneurysm this has been stable per CT scan done back in May of l2013-- an ultrasound ordered subsequently- was nondiagnostic because of bowel gas-an  unenhanced CT was suggested-this was ordered--but apparently could not be performed secondary to patient agitation--I did discuss this with her husband-I suspect she would be a very poor operative candidate-and husband is in agreement with the conservative followup here --this was discussed with Dr. Dellia Nims as well Patient has no complaints today according to nursing staff continues to be at baseline  .  Family medical social history has been reviewed per history and physical on 08/16/2010.   Medications have been reviewed per MAR.   Review of systems--- Limited secondary to dementia please see history of present illness.  Gen. Oh fever or chills her weight appears to have stabilized  Skin-does not complaining of any itching nursing staff has not noted rashes.   Eyes--staff has not noted any visual changes .  Respiratory --nursing staff has not really noted any increased shortness of breath Or persistent cough .  Neurologic-history of seizure disorder this has been stable for an extended period of time  Cardiac-she is not complaining of chest pain.  GI-no complaints of abdominal pain-nausea vomiting diarrhea or constipation  Muscle skeletal nursing staff has not noted any joint pain complaints she appears to ambulate in her wheelchair without difficulty.  Neurologic does not complain of headaches or dizziness.  Psych does have severe dementia    Physical exam.   Temperature 98.0 pulse 64 respirations 18 blood pressures variable was noted above 14 4/66-110/76 in this range weight is 80.4 pounds  In general this is a somewhat frail elderly female in  no distress agitated with exam  .    Her skin is warm and dry .  Head ears eyes nose mouth and throat-she has prescription lenses--pupils appear reactive although limited exam since shut her eyes with attempted examination  .  Chest -no sign of labored breathing--clear to auscultation .  Heart radial  pulses regular-I did attempt auscultate her heart sounds sounded regular although this was limited secondary to patient agitation  .  Abdomen - cannot really examine secondary to patient being agitated at the time  Muscle skeletal --moves all limbs x4 at baseline-- did not note any deformities-continues to ambulate in her wheelchair  . Neurologic-I do not see any focal deficits cranial nerves grossly intact speech is clear--moves all her extremities at baseline--strength appears to be intact.  Psychiatric oriented to self only --is agitated with exam today -- this does happen frequently  .  Labs  03/29/2014.  WBC 3.9 hemoglobin 11.4 platelets 127.  Sodium 143 potassium 4.2 BUN 18 creatinine 0.89.  Albumin 3.2.    01/15/2014.  TSH-3.19.  Hemoglobin A1c 6.1.  Vitamin D 56.  WBC 5.3 hemoglobin 12.6 platelets 160.  Sodium 140 potassium 4.8 BUN 20 creatinine 0.9.  Liver function tests within normal limits except albumin of 3.3  08/05/2013.  WBC 4.8 hemoglobin 11.8 platelets 141.  Sodium 138 potassium 4.7 BUN 20 creatinine 0.84.  Hemoglobin A1c 6.1 TSH 5.963  08/10/2013 . T4-0.96--T3-2 .5  .    05/04/2013.  Hemoglobin A1c 5.9.  CBC 3.8 hemoglobin 11.6 platelets 252.  Sodium 143 potassium 4 BUN 9 creatinine 0.76.  Liver function tests within normal limits except albumin of 3.1    11/26/2012.  Hemoglobin A1c 6.0.  11/10/2012.  TSH-3.605.  11/10/2012.  WBC 5.5 hemoglobin 11.8 platelets 162.  Sodium 138 potassium 4.0 BUN 12 creatinine 0.82.  Liver function tests within normal limits except albumin of 3.1.  08/11/2012.  Hemoglobin A1c 5.8.  07/11/2012.  Cholesterol 238 triglycerides 88 HDL 60 LDL 160.  07/10/2012.  Sodium 138 potassium 4.3 BUN 17 tract 0.85.  Marland Kitchen  06/15/2012.  T4-7.7----T3-2 .5-.  06/02/2012.  Hemoglobin A1c 6.5.  05/14/2012.  WBC 4.9 hemoglobin 11.1 platelets 188.  Sodium  137 potassium 3.8 BUN 10 creatinine 0.74.  02/25/2012.  Liver function tests within normal limits.  July 2013.  Cholesterol 192 triglycerides 148 HDL 47 LDL 1:15    Assessment and plan  # .  Number -1- dementia-this appears to be slowly progressing continues to function fairly well at current setting. Continue supportive care-she is followed by psych services-has occasional periods of agitation but apparently this is been relatively stable and psychiatric services has actually discontinued her Ativan this will have to be monitored-   #2-hypertension -- continues on Lopressor Norvasc and clonidine-there is some variability here I suspect largely agitation related we'll continue to monitor for now   #3-diabetes type 2- not on any medication nonetheless her sugars run in the 70s and 80s---we'll check a hemoglobin A1c-also encourage at bedtime snack   #4 osteoporosis continues on calcium with vitamin D.-   #5 seizure disorder-this has been stable for an extended period of time she is on Keppra.   #6 CHF-this is stable Aldactone discontinued in the past secondary to renal insufficiency-clinically she appears to be stable.--Renal panel appears to be stable   #7-coronary artery disease-this is stable she is on aspirin.   #8anemia-this has been stable --thought likely chronic disease-we'll update CBC--I do note platelets were slightly reduced on recent lab   #  9-history of abdominal aortic aneurysm-. An ultrasound was ordered however it was nondiagnostic obscured by bowel gas suggested CT follow which was ordered--unfortunately patient was quite agitated and this could not be done..-- at this point do not see the value of further diagnostic imaging patient would be a very poor candidate for any aggressive workup and she has great anxiety with attempts at obtaining a CT scan..--this has been discussed with Dr. Dellia Nims via phone--and also with her husband who agrees with non  aggressive followup    10 -- depression- on Lexapro.-- is followed by psych services   Number 11-mildly elevated TSH --T3 T4 appeared to be within normal range --recent TSH was within normal limits   . #12-weight loss--this appears to have stabilized recently-she is on supplements-I suspect at some point in the future this will continue to be a challenge with her progressing dementia--Will update a CMP  [id number]

## 2014-05-10 ENCOUNTER — Encounter (HOSPITAL_COMMUNITY)
Admission: RE | Admit: 2014-05-10 | Discharge: 2014-05-10 | Disposition: A | Discharge status: Home / Self Care | Payer: Medicare Other | Source: Skilled Nursing Facility | Attending: Internal Medicine | Admitting: Internal Medicine

## 2014-05-10 DIAGNOSIS — E119 Type 2 diabetes mellitus without complications: Secondary | ICD-10-CM | POA: Insufficient documentation

## 2014-05-10 LAB — CBC WITH DIFFERENTIAL/PLATELET
BASOS ABS: 0 10*3/uL (ref 0.0–0.1)
Basophils Relative: 0 % (ref 0–1)
EOS ABS: 0.1 10*3/uL (ref 0.0–0.7)
EOS PCT: 2 % (ref 0–5)
HCT: 37.8 % (ref 36.0–46.0)
HEMOGLOBIN: 12.3 g/dL (ref 12.0–15.0)
Lymphocytes Relative: 20 % (ref 12–46)
Lymphs Abs: 1 10*3/uL (ref 0.7–4.0)
MCH: 30.1 pg (ref 26.0–34.0)
MCHC: 32.5 g/dL (ref 30.0–36.0)
MCV: 92.4 fL (ref 78.0–100.0)
Monocytes Absolute: 0.8 10*3/uL (ref 0.1–1.0)
Monocytes Relative: 15 % — ABNORMAL HIGH (ref 3–12)
Neutro Abs: 3.1 10*3/uL (ref 1.7–7.7)
Neutrophils Relative %: 63 % (ref 43–77)
Platelets: 146 10*3/uL — ABNORMAL LOW (ref 150–400)
RBC: 4.09 MIL/uL (ref 3.87–5.11)
RDW: 13.9 % (ref 11.5–15.5)
WBC: 4.9 10*3/uL (ref 4.0–10.5)

## 2014-05-11 LAB — HEMOGLOBIN A1C
Hgb A1c MFr Bld: 6 % — ABNORMAL HIGH (ref 4.8–5.6)
MEAN PLASMA GLUCOSE: 126 mg/dL

## 2014-09-01 ENCOUNTER — Encounter: Payer: Self-pay | Admitting: Internal Medicine

## 2014-09-01 ENCOUNTER — Non-Acute Institutional Stay (SKILLED_NURSING_FACILITY): Payer: Medicare Other | Admitting: Internal Medicine

## 2014-09-01 DIAGNOSIS — R569 Unspecified convulsions: Secondary | ICD-10-CM

## 2014-09-01 DIAGNOSIS — E119 Type 2 diabetes mellitus without complications: Secondary | ICD-10-CM

## 2014-09-01 DIAGNOSIS — F0391 Unspecified dementia with behavioral disturbance: Secondary | ICD-10-CM | POA: Diagnosis not present

## 2014-09-01 DIAGNOSIS — I10 Essential (primary) hypertension: Secondary | ICD-10-CM | POA: Diagnosis not present

## 2014-09-01 NOTE — Progress Notes (Signed)
Patient ID: [name], female   DOB: 07/09/1925, 81 y.o.   MRN: [id number]     Level of care skilled.  Facility [facility].    Chief complaint-medical management of dementia hypertension CHF diabetes type 2 seizure disorder history of abdominal aortic aneurysm---   History of present illness  Patient is an elderly resident with the above diagnoses-- her dementia does appear to be progressing--although nursing staff says she's been relatively stable-she has somewhat of a spotty appetite she is on supplements and weight has been stable. Actually has gained a couple pounds over the past several months  She has progressive Alzheimer Disease she does ambulate about the facility in her wheelchair is confused-does have occasional periods of agitation--and exam was somewhat difficult because of that however she appeared to be at baseline    .    She does have a history of hypertension-recent blood pressures range from 130/80-161/59- this appears to be relatively stable she has in the past had systolic elevations at times I suspect this could be due to her agitation at times She is on clonidine 0.1 mg twice a day as well as Lopressor 75 mg twice a day and Norvasc 10 mg daily .  Back in September it was noted she lost about 5 pounds since June-this appears to have stabilized most recent weight is in the low 80s   Currently her appetite continues to be somewhat spotty she continues on supplements  .  She has a history of diabetes type 2 she was on Actos but that was discontinued secondary to concerns about some lower blood sugars--her sugars run largely in the 70-80's   on no medication    She continues on Keppra for a history of seizure disorder there've been no recent seizures.  She has a history of abdominal aortic aneurysm this has been stable per CT scan done back in May of l2013-- an ultrasound ordered subsequently- was nondiagnostic because of bowel gas-an unenhanced CT was  suggested-this was ordered--but apparently could not be performed secondary to patient agitation--I did discuss this with her husband-I suspect she would be a very poor operative candidate-and husband is in agreement with the conservative followup here --this was discussed with Dr. Dellia Nims as well Patient has no complaints today according to nursing staff continues to be at baseline  .  Family medical social history has been reviewed per history and physical on 08/16/2010.   Medications have been reviewed per [facility] Medications include Tylenol-aspirin 81 mg a day-clonidine 0.1 mg twice a day-Colace.  Metoprolol 75 mg twice a day.  Multivitamin.  Norvasc 10 mg daily.  Remeron 7.5 mg daily.  She is on times.  Also continues on Zantac.   Review of systems--- Limited secondary to dementia please see history of present illness.  Gen. Oh fever or chills her weight appears to have stabilized  Skin-does not complaining of any itching nursing staff has not noted rashes.   Eyes--staff has not noted any visual changes .  Respiratory --nursing staff has not really noted any increased shortness of breath Or persistent cough .  Neurologic-history of seizure disorder this has been stable for an extended period of time  Cardiac-she is not complaining of chest pain.  GI-no complaints of abdominal pain-nausea vomiting diarrhea or constipation  Muscle skeletal nursing staff has not noted any joint pain complaints she appears to ambulate in her wheelchair without difficulty.  Neurologic does not complain of headaches or dizziness.  Psych does have severe dementia  Physical exam.   She is afebrile pulse is 60 respirations 18 blood pressure taken manually 150/62 she had some agitation I see variable blood pressures systolically ranging from the 120s-170s   In general this is a somewhat frail elderly female in no distress  Somewhat  agitated with exam  .    Her skin is  warm and dry  Oropharynx is clear mucous membranes appear moist .  Head ears eyes nose mouth and throat-she has prescription lenses--p .  Chest -no sign of labored breathing--clear to auscultation .  Heart radial pulses regular-I did attempt auscultate her heart sounds sounded regular although this was limited secondary to patient agitation  .  Abdomen - soft nontender with positive bowel sounds  Muscle skeletal --moves all limbs x4 at baseline-- did not note any deformities-continues to ambulate in her wheelchair  . Neurologic-I do not see any focal deficits cranial nerves grossly intact speech is clear--moves all her extremities at baseline--strength appears to be intact.  Psychiatric oriented to self only --is  agitated with exam today -- this does happen frequently  .  Labs  March 2016.  WBC 4.9 hemoglobin 12.3 platelets 146.  Hemoglobin A1c 6.0  03/29/2014.  WBC 3.9 hemoglobin 11.4 platelets 127.  Sodium 143 potassium 4.2 BUN 18 creatinine 0.89.  Albumin 3.2. Otherwise liver function tests within normal limits    01/15/2014.  TSH-3.19.  Hemoglobin A1c 6.1.  Vitamin D 56.  WBC 5.3 hemoglobin 12.6 platelets 160.  Sodium 140 potassium 4.8 BUN 20 creatinine 0.9.  Liver function tests within normal limits except albumin of 3.3  08/05/2013.  WBC 4.8 hemoglobin 11.8 platelets 141.  Sodium 138 potassium 4.7 BUN 20 creatinine 0.84.  Hemoglobin A1c 6.1 TSH 5.963  08/10/2013 . T4-0.96--T3-2 .5  .    05/04/2013.  Hemoglobin A1c 5.9.  CBC 3.8 hemoglobin 11.6 platelets 252.  Sodium 143 potassium 4 BUN 9 creatinine 0.76.  Liver function tests within normal limits except albumin of 3.1    11/26/2012.  Hemoglobin A1c 6.0.  11/10/2012.  TSH-3.605.  11/10/2012.  WBC 5.5 hemoglobin 11.8 platelets 162.  Sodium 138 potassium 4.0 BUN 12 creatinine 0.82.  Liver function tests within normal limits except albumin of  3.1.  08/11/2012.  Hemoglobin A1c 5.8.  07/11/2012.  Cholesterol 238 triglycerides 88 HDL 60 LDL 160.  07/10/2012.  Sodium 138 potassium 4.3 BUN 17 tract 0.85.  Marland Kitchen  06/15/2012.  T4-7.7----T3-2 .5-.  06/02/2012.  Hemoglobin A1c 6.5.  05/14/2012.  WBC 4.9 hemoglobin 11.1 platelets 188.  Sodium 137 potassium 3.8 BUN 10 creatinine 0.74.  02/25/2012.  Liver function tests within normal limits.  July 2013.  Cholesterol 192 triglycerides 148 HDL 47 LDL 1:15    Assessment and plan  # .  Number -1- dementia-this appears to be slowly progressing continues to function fairly well at current setting. Continue supportive care-she is followed by psych services-has occasional periods of agitation but apparently this is been relatively stable and psychiatric services has actually discontinued her Ativan this will have to be monitored-   #2-hypertension -- continues on Lopressor Norvasc and clonidine-there is some variability here I suspect largely agitation related we'll continue to monitor for now--blood pressures quite variable ranging from 128/58 135/64 179/57-again I got about 150/62 today but she was somewhat agitated   #3-diabetes type 2- not on any medication --hemoglobin A1c recently 6.0--appears to be diet-controlled Fasting blood sugars are largely in the 90s   #4 osteoporosis continues on calcium with vitamin D.-   #  5 seizure disorder-this has been stable for an extended period of time she is on Keppra.   #6 CHF-this is stable Aldactone discontinued in the past secondary to renal insufficiency-clinically she appears to be stable.--Renal panel appears to be stablewill update   #7-coronary artery disease-this is stable she is on aspirin.   #8anemia-this has been stable --thought likely chronic disease-we'll update CBC--I do note platelets were slightly reduced on recent lab   #9-history of abdominal aortic aneurysm-. An ultrasound  was ordered however it was nondiagnostic obscured by bowel gas suggested CT follow which was ordered--unfortunately patient was quite agitated and this could not be done..-- at this point do not see the value of further diagnostic imaging patient would be a very poor candidate for any aggressive workup and she has great anxiety with attempts at obtaining a CT scan..--this has been discussed with Dr. Dellia Nims via phone--and also with her husband who agrees with non aggressive followup    10 -- depression- on Lexapro.-- is followed by psych services   Number 11-mildly elevated TSH --T3 T4 appeared to be within normal range --recent TSH was within normal limits--we'll update this   . #12-weight loss--this appears to have stabilized recently-she is on supplements-I suspect at some point in the future this will continue to be a challenge with her progressing dementia--Will update a CMP  [id number]

## 2014-09-02 ENCOUNTER — Encounter (HOSPITAL_COMMUNITY)
Admission: AD | Admit: 2014-09-02 | Discharge: 2014-09-02 | Disposition: A | Discharge status: Home / Self Care | Payer: Medicare Other | Source: Skilled Nursing Facility | Attending: Internal Medicine | Admitting: Internal Medicine

## 2014-09-02 DIAGNOSIS — I10 Essential (primary) hypertension: Secondary | ICD-10-CM | POA: Insufficient documentation

## 2014-09-02 DIAGNOSIS — E119 Type 2 diabetes mellitus without complications: Secondary | ICD-10-CM | POA: Insufficient documentation

## 2014-09-02 LAB — CBC WITH DIFFERENTIAL/PLATELET
BASOS PCT: 0 % (ref 0–1)
Basophils Absolute: 0 10*3/uL (ref 0.0–0.1)
EOS ABS: 0.1 10*3/uL (ref 0.0–0.7)
EOS PCT: 2 % (ref 0–5)
HCT: 40.2 % (ref 36.0–46.0)
Hemoglobin: 13.1 g/dL (ref 12.0–15.0)
LYMPHS ABS: 1 10*3/uL (ref 0.7–4.0)
LYMPHS PCT: 19 % (ref 12–46)
MCH: 30.6 pg (ref 26.0–34.0)
MCHC: 32.6 g/dL (ref 30.0–36.0)
MCV: 93.9 fL (ref 78.0–100.0)
MONOS PCT: 11 % (ref 3–12)
Monocytes Absolute: 0.5 10*3/uL (ref 0.1–1.0)
NEUTROS ABS: 3.3 10*3/uL (ref 1.7–7.7)
NEUTROS PCT: 68 % (ref 43–77)
PLATELETS: 127 10*3/uL — AB (ref 150–400)
RBC: 4.28 MIL/uL (ref 3.87–5.11)
RDW: 13.7 % (ref 11.5–15.5)
WBC: 4.9 10*3/uL (ref 4.0–10.5)

## 2014-09-02 LAB — COMPREHENSIVE METABOLIC PANEL
ALT: 17 U/L (ref 14–54)
AST: 28 U/L (ref 15–41)
Albumin: 3.2 g/dL — ABNORMAL LOW (ref 3.5–5.0)
Alkaline Phosphatase: 62 U/L (ref 38–126)
Anion gap: 6 (ref 5–15)
BUN: 26 mg/dL — ABNORMAL HIGH (ref 6–20)
CO2: 26 mmol/L (ref 22–32)
CREATININE: 0.87 mg/dL (ref 0.44–1.00)
Calcium: 8.8 mg/dL — ABNORMAL LOW (ref 8.9–10.3)
Chloride: 107 mmol/L (ref 101–111)
GFR, EST NON AFRICAN AMERICAN: 59 mL/min — AB (ref >60)
GLUCOSE: 95 mg/dL (ref 65–99)
Potassium: 4.6 mmol/L (ref 3.5–5.1)
SODIUM: 139 mmol/L (ref 135–145)
Total Bilirubin: 0.7 mg/dL (ref 0.3–1.2)
Total Protein: 6.4 g/dL — ABNORMAL LOW (ref 6.5–8.1)

## 2014-09-02 LAB — TSH: TSH: 5.154 u[IU]/mL — ABNORMAL HIGH (ref 0.350–4.500)

## 2014-09-07 ENCOUNTER — Encounter (HOSPITAL_COMMUNITY)
Admission: RE | Admit: 2014-09-07 | Discharge: 2014-09-07 | Disposition: A | Discharge status: Home / Self Care | Payer: Medicare Other | Source: Skilled Nursing Facility | Attending: Internal Medicine | Admitting: Internal Medicine

## 2014-09-07 DIAGNOSIS — E031 Congenital hypothyroidism without goiter: Secondary | ICD-10-CM | POA: Diagnosis not present

## 2014-09-07 LAB — T4, FREE: Free T4: 0.87 ng/dL (ref 0.61–1.12)

## 2014-09-08 LAB — T3, FREE: T3, Free: 2.2 pg/mL (ref 2.0–4.4)

## 2014-11-16 ENCOUNTER — Encounter: Payer: Self-pay | Admitting: Internal Medicine

## 2014-11-16 ENCOUNTER — Non-Acute Institutional Stay (SKILLED_NURSING_FACILITY): Payer: Medicare Other | Admitting: Internal Medicine

## 2014-11-16 DIAGNOSIS — F0391 Unspecified dementia with behavioral disturbance: Secondary | ICD-10-CM | POA: Diagnosis not present

## 2014-11-16 DIAGNOSIS — D638 Anemia in other chronic diseases classified elsewhere: Secondary | ICD-10-CM | POA: Diagnosis not present

## 2014-11-16 DIAGNOSIS — I10 Essential (primary) hypertension: Secondary | ICD-10-CM | POA: Diagnosis not present

## 2014-11-16 DIAGNOSIS — E119 Type 2 diabetes mellitus without complications: Secondary | ICD-10-CM

## 2014-11-16 NOTE — Progress Notes (Signed)
Patient ID: [name], female   DOB: 09-Nov-1925, 81 y.o.   MRN: [id number]      Level of care skilled.  Facility [facility]    Chief complaint-medical management of dementia hypertension CHF diabetes type 2 seizure disorder history of abdominal aortic aneurysm-   History of present illness  Patient is an elderly resident with the above diagnoses-- her dementia does appear to be progressing--although nursing staff says she's been relatively stable-she has somewhat of a spotty appetite she is on supplements and weight has been stable.   She has progressive Alzheimer Disease she does ambulate about the facility in her wheelchair is confused-does have occasional periods of agitation--and exam was somewhat difficult because of that however she appeared to be at baseline    .    She does have a history of hypertension-recent blood pressures range from 118/76-110/68-172/64 I suspect she has occasional agitation which may lead to systolic spikes at times-  She is on clonidine 0.1 mg twice a day as well as Lopressor 75 mg twice a day and Norvasc 10 mg daily .  Back in September it was noted she lost about 5 pounds since June-this appears to have stabilized most recent weight is in the low 80s  Currently her appetite continues to be somewhat spotty she continues on supplements  .  She has a history of diabetes type 2 she was on Actos but that was discontinued secondary to concerns about some lower blood sugars--her sugars run largely in the 70-90's   on no medication    She continues on Keppra for a history of seizure disorder there've been no recent seizures.  She has a history of abdominal aortic aneurysm this has been stable per CT scan done back in May of l2013-- an ultrasound ordered subsequently- was nondiagnostic because of bowel gas-an unenhanced CT was suggested-this was ordered--but apparently could not be performed secondary to patient agitation--I did discuss this  with her husband-I suspect she would be a very poor operative candidate-and husband is in agreement with the conservative followup here --this was discussed with Dr. Dellia Nims as well Patient has no complaints today according to nursing staff continues to be at baseline  .  Family medical social history has been reviewed per history and physical on 08/16/2010.   Medications have been reviewed per [facility] Medications include Tylenol-aspirin 81 mg a day-clonidine 0.1 mg twice a day-Colace under milligrams daily.  Metoprolol 75 mg twice a day.  Multivitamin.  Norvasc 10 mg daily.  Remeron 7.5 mg daily.    Also continues on Zantac.   Review of systems--- Limited secondary to dementia please see history of present illness.  Gen. no fever or chills her weight appears to have stabilized  Skin-does not complaining of any itching nursing staff has not noted rashes.   Eyes--staff has not noted any visual changes .  Respiratory --nursing staff has not really noted any increased shortness of breath Or persistent cough .  Neurologic-history of seizure disorder this has been stable for an extended period of time  Cardiac-she is not complaining of chest pain.  GI-no complaints of abdominal pain-nausea vomiting diarrhea or constipation  Muscle skeletal nursing staff has not noted any joint pain complaints she appears to ambulate in her wheelchair without difficulty.  Neurologic does not complain of headaches or dizziness.  Psych does have severe dementia    Physical exam.   Temperature is 98.5 pulse 70 respirations 20 blood pressure 118/76 weight is stable at 84.8  In general this is a somewhat frail elderly female in no distress  Somewhat  agitated with exam  .    Her skin is warm and dry  Oropharynx is clear mucous membranes appear moist .  Head ears eyes nose mouth and throat-she has prescription lenses-- .  Chest -no sign of labored breathing--clear to  auscultation there is poor respiratory effort she does not really follow verbal commands .  Heart radial pulses regular-I did attempt auscultate her heart sounds sounded regular   .  Abdomen - soft nontender with positive bowel sounds  Muscle skeletal --moves all limbs x4 at baseline-- did not note any deformities-continues to ambulate in her wheelchair  . Neurologic-I do not see any focal deficits cranial nerves grossly intact speech is clear--moves all her extremities at baseline--strength appears to be intact.--She is agitated with exam and moving her extremities at baseline  Psychiatric oriented to self only --is  agitated with exam today -- this does happen frequently  .  Labs    09/07/2014.  Free T4-within normal limits at 0.87.  T3 within normal range 2.2.  09/02/2014.  Sodium 139 potassium 4.6 BUN 26 creatinine 0.87.  Calcium 8.8.  Albumin 3.2 otherwise liver function tests within normal limits.  WBC 4.9 hemoglobin 13.1 platelets 127.      March 2016.  WBC 4.9 hemoglobin 12.3 platelets 146.  Hemoglobin A1c 6.0  03/29/2014.  WBC 3.9 hemoglobin 11.4 platelets 127.  Sodium 143 potassium 4.2 BUN 18 creatinine 0.89.  Albumin 3.2. Otherwise liver function tests within normal limits    01/15/2014.  TSH-3.19.  Hemoglobin A1c 6.1.  Vitamin D 56.  WBC 5.3 hemoglobin 12.6 platelets 160.  Sodium 140 potassium 4.8 BUN 20 creatinine 0.9.  Liver function tests within normal limits except albumin of 3.3  08/05/2013.  WBC 4.8 hemoglobin 11.8 platelets 141.  Sodium 138 potassium 4.7 BUN 20 creatinine 0.84.  Hemoglobin A1c 6.1 TSH 5.963  08/10/2013 . T4-0.96--T3-2 .5  .    05/04/2013.  Hemoglobin A1c 5.9.  CBC 3.8 hemoglobin 11.6 platelets 252.  Sodium 143 potassium 4 BUN 9 creatinine 0.76.  Liver function tests within normal limits except albumin of 3.1    11/26/2012.  Hemoglobin A1c 6.0.  11/10/2012.   TSH-3.605.  11/10/2012.  WBC 5.5 hemoglobin 11.8 platelets 162.  Sodium 138 potassium 4.0 BUN 12 creatinine 0.82.  Liver function tests within normal limits except albumin of 3.1.  08/11/2012.  Hemoglobin A1c 5.8.  07/11/2012.  Cholesterol 238 triglycerides 88 HDL 60 LDL 160.  07/10/2012.  Sodium 138 potassium 4.3 BUN 17 tract 0.85.  Marland Kitchen  06/15/2012.  T4-7.7----T3-2 .5-.  06/02/2012.  Hemoglobin A1c 6.5.  05/14/2012.  WBC 4.9 hemoglobin 11.1 platelets 188.  Sodium 137 potassium 3.8 BUN 10 creatinine 0.74.  02/25/2012.  Liver function tests within normal limits.  July 2013.  Cholesterol 192 triglycerides 148 HDL 47 LDL 1:15    Assessment and plan  # .  Number -1- dementia-this appears to be slowly progressing continues to function fairly well at current setting. Continue supportive care-she is followed by psych services-has occasional periods of agitation but apparently this is been relatively stable and psychiatric services has actually discontinued her Ativan this will have to be monitored-   #2-hypertension -- continues on Lopressor Norvasc and clonidine-there is some variability here I suspect largely agitation related we'll continue to monitor for now--    #3-diabetes type 2- not on any medication --hemoglobin A1c recently 6.0--appears to be diet-controlled Fasting blood sugars  are largely in the 70-90's   #4 osteoporosis continues on calcium with vitamin D.-   #5 seizure disorder-this has been stable for an extended period of time she is on Keppra.   #6 CHF-this is stable Aldactone discontinued in the past secondary to renal insufficiency-clinically she appears to be stable.--Renal panel appears to be stablewill update   #7-coronary artery disease-this is stable she is on aspirin.   #8anemia-this has been stable --thought likely chronic disease-we'll update CBC--I do note platelets were slightly reduced on  recent labs although this is been relatively stable--   #9-history of abdominal aortic aneurysm-. An ultrasound was ordered however it was nondiagnostic obscured by bowel gas suggested CT follow which was ordered--unfortunately patient was quite agitated and this could not be done..-- at this point do not see the value of further diagnostic imaging patient would be a very poor candidate for any aggressive workup and she has great anxiety with attempts at obtaining a CT scan..--this has been discussed with Dr. Dellia Nims via phone--and also with her husband who agrees with non aggressive followup    10 -- depression- on Lexapro.-- is followed by psych services   Number 11-mildly elevated TSH --T3 T4 appeared to be within normal range --recent TSH was only elevated we'll update    . #12-weight loss--this appears to have stabilized recently-she is on supplements-I suspect at some point in the future this will continue to be a challenge with her progressing dementia--Will update a CMP  [id number]

## 2014-11-17 ENCOUNTER — Encounter (HOSPITAL_COMMUNITY)
Admission: AD | Admit: 2014-11-17 | Discharge: 2014-11-17 | Disposition: A | Discharge status: Home / Self Care | Payer: Medicare Other | Source: Skilled Nursing Facility | Attending: Internal Medicine | Admitting: Internal Medicine

## 2014-11-17 DIAGNOSIS — I10 Essential (primary) hypertension: Secondary | ICD-10-CM | POA: Insufficient documentation

## 2014-11-17 LAB — CBC
HCT: 40.2 % (ref 36.0–46.0)
Hemoglobin: 13.3 g/dL (ref 12.0–15.0)
MCH: 31.1 pg (ref 26.0–34.0)
MCHC: 33.1 g/dL (ref 30.0–36.0)
MCV: 93.9 fL (ref 78.0–100.0)
Platelets: 132 10*3/uL — ABNORMAL LOW (ref 150–400)
RBC: 4.28 MIL/uL (ref 3.87–5.11)
RDW: 14.1 % (ref 11.5–15.5)
WBC: 5.2 10*3/uL (ref 4.0–10.5)

## 2014-11-17 LAB — COMPREHENSIVE METABOLIC PANEL
ALT: 15 U/L (ref 14–54)
AST: 19 U/L (ref 15–41)
Albumin: 3.3 g/dL — ABNORMAL LOW (ref 3.5–5.0)
Alkaline Phosphatase: 52 U/L (ref 38–126)
Anion gap: 4 — ABNORMAL LOW (ref 5–15)
BILIRUBIN TOTAL: 0.7 mg/dL (ref 0.3–1.2)
BUN: 26 mg/dL — ABNORMAL HIGH (ref 6–20)
CHLORIDE: 107 mmol/L (ref 101–111)
CO2: 29 mmol/L (ref 22–32)
Calcium: 8.5 mg/dL — ABNORMAL LOW (ref 8.9–10.3)
Creatinine, Ser: 0.82 mg/dL (ref 0.44–1.00)
GFR calc non Af Amer: >60 mL/min (ref >60)
Glucose, Bld: 90 mg/dL (ref 65–99)
Potassium: 4.2 mmol/L (ref 3.5–5.1)
Sodium: 140 mmol/L (ref 135–145)
Total Protein: 6.4 g/dL — ABNORMAL LOW (ref 6.5–8.1)

## 2014-11-17 LAB — TSH: TSH: 4.701 u[IU]/mL — AB (ref 0.350–4.500)

## 2015-02-22 ENCOUNTER — Non-Acute Institutional Stay (SKILLED_NURSING_FACILITY): Payer: Medicare Other | Admitting: Internal Medicine

## 2015-02-22 ENCOUNTER — Ambulatory Visit (HOSPITAL_COMMUNITY)
Admission: RE | Admit: 2015-02-22 | Discharge: 2015-02-22 | Disposition: A | Discharge status: Home / Self Care | Payer: Medicare Other | Source: Ambulatory Visit | Attending: Internal Medicine | Admitting: Internal Medicine

## 2015-02-22 DIAGNOSIS — R569 Unspecified convulsions: Secondary | ICD-10-CM | POA: Diagnosis not present

## 2015-02-22 DIAGNOSIS — I70203 Unspecified atherosclerosis of native arteries of extremities, bilateral legs: Secondary | ICD-10-CM | POA: Diagnosis not present

## 2015-02-22 DIAGNOSIS — F0391 Unspecified dementia with behavioral disturbance: Secondary | ICD-10-CM | POA: Diagnosis not present

## 2015-02-22 DIAGNOSIS — W050XXA Fall from non-moving wheelchair, initial encounter: Secondary | ICD-10-CM | POA: Insufficient documentation

## 2015-02-22 DIAGNOSIS — M8580 Other specified disorders of bone density and structure, unspecified site: Secondary | ICD-10-CM | POA: Diagnosis not present

## 2015-02-22 DIAGNOSIS — N181 Chronic kidney disease, stage 1: Secondary | ICD-10-CM | POA: Diagnosis not present

## 2015-02-22 DIAGNOSIS — G309 Alzheimer's disease, unspecified: Secondary | ICD-10-CM | POA: Insufficient documentation

## 2015-02-22 DIAGNOSIS — R52 Pain, unspecified: Secondary | ICD-10-CM | POA: Diagnosis present

## 2015-02-22 DIAGNOSIS — F0280 Dementia in other diseases classified elsewhere without behavioral disturbance: Secondary | ICD-10-CM | POA: Diagnosis not present

## 2015-02-22 DIAGNOSIS — I10 Essential (primary) hypertension: Secondary | ICD-10-CM

## 2015-02-22 DIAGNOSIS — E1122 Type 2 diabetes mellitus with diabetic chronic kidney disease: Secondary | ICD-10-CM | POA: Diagnosis not present

## 2015-02-22 DIAGNOSIS — M898X9 Other specified disorders of bone, unspecified site: Secondary | ICD-10-CM

## 2015-02-22 NOTE — Progress Notes (Signed)
Patient ID: [name], female   DOB: Jun 10, 1925, 81 y.o.   MRN: [id number]       Level of care skilled.  Facility [facility].    Chief complaint-medical management of dementia hypertension CHF diabetes type 2 seizure disorder history of abdominal aortic aneurysm- Acute visit secondary to witnessed fall wheelchair   History of present illness  Patient is an elderly resident with the above diagnoses-- her dementia does appear to be progressing--although nursing staff says she's been relatively stable-she has somewhat of a spotty appetite she is on supplements and weight has been stable.   She has progressive Alzheimer Disease she does ambulate about the facility in her wheelchair is confused-does have occasional periods of agitation- Apparently late this afternoon she did slide out of her wheelchair with no apparent injuries she did not hit her head-during exam nursing staff did note somewhat of a firm prominence on her right hip area this was nontender-unsure whether this was something chronic or new -- Physical exam 1 patient was put to bed was quite unremarkable in regards to flexion and extension at the hip I do not really note any deformity she did have bilateral surgical scars of her hips I did note this bony prominence in the hip area this was firm nontender Per chart review I see she has had previous x-rays of the area most recently appears March 2011 which showed a healing right pubic rami fracture-it did show status post ORIF bilaterally with orthopedic hardware in the proximal femora-   .   Regards to other issues She does have a history of hypertension-recent blood pressures listed today 146/59-I see also 146/57-155/60 5I suspect some of this may be agitation related is been difficult at times get a manual reading secondary to agitation but we will try this before making any adjustments    She is on clonidine 0.1 mg twice a day as well as Lopressor 75 mg twice a day and  Norvasc 10 mg daily .  Back in September it was noted she lost about 5 pounds since June-this appears to have stabilized most recent weight is in the low 80s  Currently her appetite continues to be somewhat spotty she continues on supplements  .  She has a history of diabetes type 2 she was on Actos but that was discontinued secondary to concerns about some lower blood sugars--her sugars run largely in the 70-80's   on no medication    She continues on Keppra for a history of seizure disorder there've been no recent seizures.  She has a history of abdominal aortic aneurysm this has been stable per CT scan done back in May of l2013-- an ultrasound ordered subsequently- was nondiagnostic because of bowel gas-an unenhanced CT was suggested-this was ordered--but apparently could not be performed secondary to patient agitation--I did discuss this with her husband-I suspect she would be a very poor operative candidate-and husband is in agreement with the conservative followup here --this was discussed with Dr. Dellia Nims as well  .  Family medical social history has been reviewed per previous progress notes including 11/16/2014.   Medications have been reviewed per [facility] Medications include Tylenol-aspirin 81 mg a day-clonidine 0.1 mg twice a day-Colace under milligrams daily.  Metoprolol 75 mg twice a day.  Multivitamin.  Norvasc 10 mg daily.  Remeron 7.5 mg daily.    Also continues on Zantac.   Review of systems--- Limited secondary to dementia please see history of present illness.  Gen. no fever  or chills her weight appears to have stabilized  Skin-does not complaining of any itching nursing staff has not noted rashes.   Eyes--staff has not noted any visual changes .  Respiratory --nursing staff has not really noted any increased shortness of breath Or persistent cough .  Neurologic-history of seizure disorder this has been stable for an extended period of time   Cardiac-she is not complaining of chest pain.  GI-no complaints of abdominal pain-nausea vomiting diarrhea or constipation  Muscle skeletal nursing staff has not noted any joint pain complaints she appears to ambulate in her wheelchair without difficulty again apparently she slid out of her wheelchair earlier this evening no apparent injury bony prominence has been noted in the right hip area .  Neurologic does not complain of headaches or dizziness.  Psych does have severe dementia    Physical exam.   She is afebrile pulse 62 respirations 18 O2 sat 97% on room air blood pressure 146/59 her weight is stable at 83 pounds   In general this is a somewhat frail elderly female in no distress  Somewhat  agitated with exam-currently lying comfortably in bed  .    Her skin is warm and dry  Oropharynx is clear mucous membranes appear moist .  Head ears eyes nose mouth and throat-she has prescription lenses-acuity appears grossly intact- .  Chest -no sign of labored breathing--clear to auscultation there is poor respiratory effort she does not really follow verbal commands .  Heart radial pulses regular-I did attempt auscultate her heart sounds sounded regula although this was somewhat limited r   .  Abdomen - soft nontender with positive bowel sounds  Muscle skeletal --moves all limbs x4 at baseline-- did not note any deformities-other than the prominence  approximately a 2 cm in diameter  in  right hip area this is firm nonmovable not tender to touch nonerythematous not warm.  There is no pain with gentle flexion and extension at the hip I did not note any deformity other than general frailty she does have well-healed surgical scars both hips  . Neurologic-I do not see any focal deficits cranial nerves grossly intact speech is clear--moves all her extremities at baseline--strength appears to be intact.-- moving her extremities at baseline  Psychiatric oriented to  self only --some agitation although this is not as prominent tinnitus and has been in the past  .  Labs  September 6 14 2016.  Sodium 140 potassium 4.2 BUN 26 creatinine 0.82 CO2 level XXIX.  Albumin 3.3 otherwise liver function tests within normal limits.  WBC 5.2 hemoglobin 13.3 platelets 132.  TSH 4.70.      09/07/2014.  Free T4-within normal limits at 0.87.  T3 within normal range 2.2.  09/02/2014.  Sodium 139 potassium 4.6 BUN 26 creatinine 0.87.  Calcium 8.8.  Albumin 3.2 otherwise liver function tests within normal limits.  WBC 4.9 hemoglobin 13.1 platelets 127.      March 2016.  WBC 4.9 hemoglobin 12.3 platelets 146.  Hemoglobin A1c 6.0  03/29/2014.  WBC 3.9 hemoglobin 11.4 platelets 127.  Sodium 143 potassium 4.2 BUN 18 creatinine 0.89.  Albumin 3.2. Otherwise liver function tests within normal limits    01/15/2014.  TSH-3.19.  Hemoglobin A1c 6.1.  Vitamin D 56.  WBC 5.3 hemoglobin 12.6 platelets 160.  Sodium 140 potassium 4.8 BUN 20 creatinine 0.9.  Liver function tests within normal limits except albumin of 3.3  08/05/2013.  WBC 4.8 hemoglobin 11.8 platelets 141.  Sodium 138 potassium  4.7 BUN 20 creatinine 0.84.  Hemoglobin A1c 6.1 TSH 5.963  08/10/2013 . T4-0.96--T3-2 .5  .    05/04/2013.  Hemoglobin A1c 5.9.  CBC 3.8 hemoglobin 11.6 platelets 252.  Sodium 143 potassium 4 BUN 9 creatinine 0.76.  Liver function tests within normal limits except albumin of 3.1    11/26/2012.  Hemoglobin A1c 6.0.  11/10/2012.  TSH-3.605.  11/10/2012.  WBC 5.5 hemoglobin 11.8 platelets 162.  Sodium 138 potassium 4.0 BUN 12 creatinine 0.82.  Liver function tests within normal limits except albumin of 3.1.  08/11/2012.  Hemoglobin A1c 5.8.  07/11/2012.  Cholesterol 238 triglycerides 88 HDL 60 LDL 160.  07/10/2012.  Sodium 138 potassium 4.3 BUN 17 tract 0.85.  Marland Kitchen  06/15/2012.   T4-7.7----T3-2 .5-.  06/02/2012.  Hemoglobin A1c 6.5.  05/14/2012.  WBC 4.9 hemoglobin 11.1 platelets 188.  Sodium 137 potassium 3.8 BUN 10 creatinine 0.74.  02/25/2012.  Liver function tests within normal limits.  July 2013.  Cholesterol 192 triglycerides 148 HDL 47 LDL 1:15    Assessment and plan  # .  Number -1- dementia-this appears to be slowly progressing continues to function fairly well at current setting. Continue supportive care-she is followed by psych services-has occasional periods of agitation but apparently this is been relatively stable and psychiatric services has actually discontinued her Ativan this will have to be monitored-   #2-hypertension -- continues on Lopressor Norvasc and clonidine-there is some variability here I suspect largely agitation related we'll continue to monitor for now--    #3-diabetes type 2- not on any medication -- -appears to be diet-controlled Fasting blood sugars are largely in the 70-80's--will update a hemoglobin A1c   #4 osteoporosis continues on calcium with vitamin D.-   #5 seizure disorder-this has been stable for an extended period of time she is on Keppra.   #6 CHF-this is stable Aldactone discontinued in the past secondary to renal insufficiency-clinically she appears to be stable.--Renal panel appears to be stablewill update   #7-coronary artery disease-this is stable she is on aspirin.   #8anemia-this has been stable --thought likely chronic disease-we'll update CBC--I do note platelets were slightly reduced on recent labs although this is been relatively stable--   #9-history of abdominal aortic aneurysm-.  Initially An ultrasound was ordered however it was nondiagnostic obscured by bowel gas suggested CT follow which was ordered--unfortunately patient was quite agitated and this could not be done..-- at that point did not see the value of further diagnostic imaging patient would be a  very poor candidate for any aggressive workup and she has great anxiety with attempts at obtaining a CT scan..--this has been discussed with Dr. Dellia Nims via phone--and also with her husband who agrees with non aggressive followup    10 -- depression- onRemeron--this is also for appetite stimulation again her weight appears to have stabilized--   #11 history of Fall  Out of wheelchair-actually it appears she more slid out of her wheelchair this was witnessed with no apparent injury-have noted what appears to be a bony prominence of right hip we will x-ray the area to rule out any acute pathology-again clinically she is not in any discomfort here     Number 11-mildly elevated TSH --T3 T4 appeared to be within normal range --recent TSH was minimally elevated we will update this--    . #12-weight loss--this appears to have stabilized recently-she is on supplements-I suspect at some point in the future this will continue to be a challenge with her progressing  dementia--Will update a CMP   Addendum-after patient returned from x-ray we did attempt to obtain a manual blood pressure patient was quite agitated however and note her arm in a flexed position which I suspect led to elevated reading I got 160/70-- at this point will await further readings so we can get more of a true baseline I suspect agitation is going to make it difficult finding a truly accurate blood pressure did not want to be overly aggressive here since previously she's had some fairly lower blood pressures   [id number]-of note greater than 40 minutes spent evaluating patient-reviewing her chart-reviewing her status with nursing staff-and coordinating and formulating a plan of care for numerous diagnoses-of note greater than 50% of time spent coordinating plan of care [phone]

## 2015-02-23 ENCOUNTER — Encounter (HOSPITAL_COMMUNITY)
Admission: AD | Admit: 2015-02-23 | Discharge: 2015-02-23 | Disposition: A | Discharge status: Home / Self Care | Payer: Medicare Other | Source: Skilled Nursing Facility | Attending: Internal Medicine | Admitting: Internal Medicine

## 2015-02-23 DIAGNOSIS — E119 Type 2 diabetes mellitus without complications: Secondary | ICD-10-CM | POA: Diagnosis present

## 2015-02-23 DIAGNOSIS — I10 Essential (primary) hypertension: Secondary | ICD-10-CM | POA: Insufficient documentation

## 2015-02-23 LAB — COMPREHENSIVE METABOLIC PANEL
ALT: 11 U/L — ABNORMAL LOW (ref 14–54)
AST: 17 U/L (ref 15–41)
Albumin: 3.2 g/dL — ABNORMAL LOW (ref 3.5–5.0)
Alkaline Phosphatase: 54 U/L (ref 38–126)
Anion gap: 5 (ref 5–15)
BUN: 19 mg/dL (ref 6–20)
CO2: 28 mmol/L (ref 22–32)
Calcium: 8.7 mg/dL — ABNORMAL LOW (ref 8.9–10.3)
Chloride: 108 mmol/L (ref 101–111)
Creatinine, Ser: 0.75 mg/dL (ref 0.44–1.00)
GFR calc Af Amer: >60 mL/min (ref >60)
GFR calc non Af Amer: >60 mL/min (ref >60)
GLUCOSE: 92 mg/dL (ref 65–99)
Potassium: 3.9 mmol/L (ref 3.5–5.1)
SODIUM: 141 mmol/L (ref 135–145)
Total Bilirubin: 0.5 mg/dL (ref 0.3–1.2)
Total Protein: 6.3 g/dL — ABNORMAL LOW (ref 6.5–8.1)

## 2015-02-23 LAB — CBC WITH DIFFERENTIAL/PLATELET
Basophils Absolute: 0 10*3/uL (ref 0.0–0.1)
Basophils Relative: 0 %
EOS PCT: 3 %
Eosinophils Absolute: 0.1 10*3/uL (ref 0.0–0.7)
HCT: 36.9 % (ref 36.0–46.0)
Hemoglobin: 12.2 g/dL (ref 12.0–15.0)
LYMPHS ABS: 1 10*3/uL (ref 0.7–4.0)
Lymphocytes Relative: 23 %
MCH: 30.6 pg (ref 26.0–34.0)
MCHC: 33.1 g/dL (ref 30.0–36.0)
MCV: 92.5 fL (ref 78.0–100.0)
Monocytes Absolute: 0.5 10*3/uL (ref 0.1–1.0)
Monocytes Relative: 12 %
Neutro Abs: 2.8 10*3/uL (ref 1.7–7.7)
Neutrophils Relative %: 62 %
PLATELETS: 133 10*3/uL — AB (ref 150–400)
RBC: 3.99 MIL/uL (ref 3.87–5.11)
RDW: 13.6 % (ref 11.5–15.5)
WBC: 4.5 10*3/uL (ref 4.0–10.5)

## 2015-02-23 LAB — TSH: TSH: 5.8 u[IU]/mL — ABNORMAL HIGH (ref 0.350–4.500)

## 2015-02-24 LAB — HEMOGLOBIN A1C
Hgb A1c MFr Bld: 6.1 % — ABNORMAL HIGH (ref 4.8–5.6)
Mean Plasma Glucose: 128 mg/dL

## 2015-04-21 ENCOUNTER — Encounter (HOSPITAL_COMMUNITY)
Admission: AD | Admit: 2015-04-21 | Discharge: 2015-04-21 | Disposition: A | Discharge status: Home / Self Care | Payer: Medicare Other | Source: Skilled Nursing Facility | Attending: Internal Medicine | Admitting: Internal Medicine

## 2015-04-21 DIAGNOSIS — I10 Essential (primary) hypertension: Secondary | ICD-10-CM | POA: Insufficient documentation

## 2015-04-21 DIAGNOSIS — D509 Iron deficiency anemia, unspecified: Secondary | ICD-10-CM | POA: Insufficient documentation

## 2015-04-22 LAB — VITAMIN D 25 HYDROXY (VIT D DEFICIENCY, FRACTURES): Vit D, 25-Hydroxy: 48.5 ng/mL (ref 30.0–100.0)

## 2015-04-30 ENCOUNTER — Non-Acute Institutional Stay (SKILLED_NURSING_FACILITY): Payer: Medicare Other | Admitting: Internal Medicine

## 2015-04-30 ENCOUNTER — Encounter: Payer: Self-pay | Admitting: Internal Medicine

## 2015-04-30 DIAGNOSIS — I10 Essential (primary) hypertension: Secondary | ICD-10-CM | POA: Diagnosis not present

## 2015-04-30 DIAGNOSIS — R569 Unspecified convulsions: Secondary | ICD-10-CM

## 2015-04-30 DIAGNOSIS — I509 Heart failure, unspecified: Secondary | ICD-10-CM

## 2015-04-30 DIAGNOSIS — F0391 Unspecified dementia with behavioral disturbance: Secondary | ICD-10-CM

## 2015-04-30 NOTE — Progress Notes (Signed)
Patient ID: [name], female   DOB: 19-Dec-1925, 82 y.o.   MRN: [id number]        Level of care skilled.  Facility [facility]    Chief complaint-medical management of dementia hypertension CHF diabetes type 2 seizure disorder history of abdominal aortic aneurysm-   History of present illness  Patient is an elderly resident with the above diagnoses-- her dementia does appear to be progressing--although nursing staff says she's been relatively stable-she has somewhat of a spotty appetite she is on supplements and weight has been stable.   She has progressive Alzheimer Disease she does ambulate about the facility in her wheelchair is confused-does have occasional periods of agitation- Back in December apparently she had had a fall but x-rays were negative for anything acute     .   Regards to other issues She does have a history of hypertension-recent blood pressures appear to be stable most recently 137/64-134/78-I suspect getting regular blood pressure readings are difficult at times secondary to patient agitation      She is on clonidine 0.1 mg twice a day as well as Lopressor 75 mg twice a day and Norvasc 10 mg daily .  Back in September it was noted she lost about 5 pounds since June-this appears to have stabilized most recent weight is in the low 80s  Currently her appetite continues to be somewhat spotty she continues on supplements  .  She has a history of diabetes type 2 she was on Actos but that was discontinued secondary to concerns about some lower blood sugars--her sugars appear to be stable per discussion with nursing staff largely in the 80s to low 100s--most recent hemoglobin A 1C was 6.1 back in December    She continues on Keppra for a history of seizure disorder there've been no recent seizures.  She has a history of abdominal aortic aneurysm this has been stable per CT scan done back in May of l2013-- an ultrasound ordered subsequently- was  nondiagnostic because of bowel gas-an unenhanced CT was suggested-this was ordered--but apparently could not be performed secondary to patient agitation--I did discuss this with her husband-I suspect she would be a very poor operative candidate-and husband is in agreement with the conservative followup here --this was discussed with Dr. Dellia Nims as well  .  Family medical social history has been reviewed per previous progress notes including 11/16/2014. and 02/22/2015   Medications have been reviewed per [facility] Medications include Tylenol-aspirin 81 mg a day-clonidine 0.1 mg twice a day-Colace 100 milligrams daily.  Metoprolol 75 mg twice a day.  Multivitamin.  Norvasc 10 mg daily.  Remeron 7.5 mg daily.  Keppra 250 mg daily at bedtime    Also continues on Zantac.   Review of systems--- Limited secondary to dementia please see history of present illness.  Gen. no fever or chills her weight appears to have stabilized  Skin-does not complaining of any itching nursing staff has not noted rashes.   Eyes--staff has not noted any visual changes .  Respiratory --nursing staff has not really noted any increased shortness of breath Or persistent cough .  Neurologic-history of seizure disorder this has been stable for an extended period of time  Cardiac-she is not complaining of chest pain.  GI-no complaints of abdominal pain-nausea vomiting diarrhea or constipation  Muscle skeletal nursing staff has not noted any joint pain complaints she appears to ambulate in her wheelchair without difficulty   .  Neurologic does not complain of headaches or  dizziness.  Psych does have severe dementia    Physical exam.    Temperature 90.0 pulse 72 respirations 20 pressure 137/64 weight is stable at 82   In general this is a somewhat frail elderly female in no distress  she is not really agitated this evening-currently lying comfortably in bed  .    Her skin is warm and  dry  Oropharynx is clear mucous membranes appear moist .  Head ears eyes nose mouth and throat-she has prescription lenses-acuity appears grossly intact- .  Chest -no sign of labored breathing--clear to auscultation there is poor respiratory effort she does not really follow verbal commands .  Heart radial pulses regular-I did attempt auscultate her heart sounds sounded regula although this was somewhat limited r secondary to patient verbalizing during exam   .  Abdomen - soft nontender with positive bowel sounds  Muscle skeletal --moves all limbs x4 at baseline-- did not note any deformities-or pain with movement    s  . Neurologic-I do not see any focal deficits cranial nerves grossly intact speech is clear--moves all her extremities at baseline--strength appears to be intact.-- moving her extremities at baseline  Psychiatric oriented to self only - Actually did allow some examination this evening with minimal agitation she does not really follow verbal commands .  Labs  02/23/2015  Sodium 141 potassium 3.9 creatinine 0.75.  Albumin 3.2 ALT 11 otherwise liver function tests within normal limits.  WBC 4.5 hemoglobin 12.2 platelets 133.  TSH-5.800.  Hemoglobin A1c 6.1.    September 6 14 2016.  Sodium 140 potassium 4.2 BUN 26 creatinine 0.82 CO2 level XXIX.  Albumin 3.3 otherwise liver function tests within normal limits.  WBC 5.2 hemoglobin 13.3 platelets 132.  TSH 4.70.      09/07/2014.  Free T4-within normal limits at 0.87.  T3 within normal range 2.2.  09/02/2014.  Sodium 139 potassium 4.6 BUN 26 creatinine 0.87.  Calcium 8.8.  Albumin 3.2 otherwise liver function tests within normal limits.  WBC 4.9 hemoglobin 13.1 platelets 127.      Marland Kitchen        Assessment and plan  # .  Number -1- dementia-this appears to be severe but continues to function fairly well at current setting. Continue supportive care-she is  followed by psych services-has occasional periods of agitation but apparently this is been relatively stable and psychiatric services has actually discontinued her Ativan this will have to be monitored-   #2-hypertension -- continues on Lopressor Norvasc and clonidine-there is some variability here I suspect largely agitation related we'll continue to monitor for now--recent blood pressures have been satisfactory systolics in the 035K    #0-XFGHWEXH type 2- not on any medication -- -appears to be diet-controlled Globin A1c shows stability at 6.1 on lab done in December    #4 osteoporosis continues on calcium with vitamin D.-   #5 seizure disorder-this has been stable for an extended period of time she is on Keppra.   #6 CHF-this is stable Aldactone discontinued in the past secondary to renal insufficiency-clinically she appears to be stable.--Renal panel appears to be stablewill update   #7-coronary artery disease-this is stable she is on aspirin.   #8anemia-this has been stable --thought likely chronic disease-we'll update CBC--I do note platelets were slightly reduced on recent labs although this is been relatively stable--   #9-history of abdominal aortic aneurysm-.  Initially An ultrasound was ordered however it was nondiagnostic obscured by bowel gas suggested CT follow which was ordered--unfortunately  patient was quite agitated and this could not be done..-- at that point did not see the value of further diagnostic imaging patient would be a very poor candidate for any aggressive workup and she has great anxiety with attempts at obtaining a CT scan..--this has been discussed with Dr. Dellia Nims via phone--and also with her husband who agrees with non aggressive followup    10 -- depression- onRemeron--this is also for appetite stimulation again her weight appears to have stabilized--         Number 11-mildly elevated TSH --T3 T4 appeared to be within normal range  --recent TSH was minimally elevated we will update this-- as well as a T4   . #12-weight loss--this appears to have stabilized recently-she is on supplements-I suspect at some point in the future this will continue to be a challenge with her progressing dementia--albumin 3.2 on recent lab    [phone]

## 2015-05-02 ENCOUNTER — Encounter (HOSPITAL_COMMUNITY)
Admission: RE | Admit: 2015-05-02 | Discharge: 2015-05-02 | Disposition: A | Discharge status: Home / Self Care | Payer: Medicare Other | Source: Skilled Nursing Facility | Attending: Internal Medicine | Admitting: Internal Medicine

## 2015-05-02 DIAGNOSIS — I10 Essential (primary) hypertension: Secondary | ICD-10-CM | POA: Diagnosis not present

## 2015-05-02 LAB — CBC WITH DIFFERENTIAL/PLATELET
BASOS ABS: 0 10*3/uL (ref 0.0–0.1)
Basophils Relative: 0 %
Eosinophils Absolute: 0.1 10*3/uL (ref 0.0–0.7)
Eosinophils Relative: 2 %
HCT: 40.4 % (ref 36.0–46.0)
HEMOGLOBIN: 13.3 g/dL (ref 12.0–15.0)
LYMPHS ABS: 1.5 10*3/uL (ref 0.7–4.0)
LYMPHS PCT: 25 %
MCH: 30.4 pg (ref 26.0–34.0)
MCHC: 32.9 g/dL (ref 30.0–36.0)
MCV: 92.4 fL (ref 78.0–100.0)
Monocytes Absolute: 0.6 10*3/uL (ref 0.1–1.0)
Monocytes Relative: 10 %
NEUTROS ABS: 3.8 10*3/uL (ref 1.7–7.7)
NEUTROS PCT: 63 %
PLATELETS: 155 10*3/uL (ref 150–400)
RBC: 4.37 MIL/uL (ref 3.87–5.11)
RDW: 14.1 % (ref 11.5–15.5)
WBC: 6 10*3/uL (ref 4.0–10.5)

## 2015-05-02 LAB — COMPREHENSIVE METABOLIC PANEL
ALBUMIN: 3.2 g/dL — AB (ref 3.5–5.0)
ALK PHOS: 48 U/L (ref 38–126)
ALT: 14 U/L (ref 14–54)
ANION GAP: 6 (ref 5–15)
AST: 18 U/L (ref 15–41)
BILIRUBIN TOTAL: 0.4 mg/dL (ref 0.3–1.2)
BUN: 28 mg/dL — ABNORMAL HIGH (ref 6–20)
CALCIUM: 8.8 mg/dL — AB (ref 8.9–10.3)
CO2: 30 mmol/L (ref 22–32)
Chloride: 108 mmol/L (ref 101–111)
Creatinine, Ser: 0.91 mg/dL (ref 0.44–1.00)
GFR calc Af Amer: >60 mL/min (ref >60)
GFR calc non Af Amer: 55 mL/min — ABNORMAL LOW (ref >60)
GLUCOSE: 95 mg/dL (ref 65–99)
POTASSIUM: 4 mmol/L (ref 3.5–5.1)
Sodium: 144 mmol/L (ref 135–145)
Total Protein: 6.4 g/dL — ABNORMAL LOW (ref 6.5–8.1)

## 2015-05-02 LAB — T4, FREE: Free T4: 0.91 ng/dL (ref 0.61–1.12)

## 2015-05-02 LAB — TSH: TSH: 3.889 u[IU]/mL (ref 0.350–4.500)

## 2015-06-14 ENCOUNTER — Encounter: Payer: Self-pay | Admitting: Internal Medicine

## 2015-06-14 ENCOUNTER — Non-Acute Institutional Stay (SKILLED_NURSING_FACILITY): Payer: Medicare Other | Admitting: Internal Medicine

## 2015-06-14 DIAGNOSIS — R059 Cough, unspecified: Secondary | ICD-10-CM

## 2015-06-14 DIAGNOSIS — J40 Bronchitis, not specified as acute or chronic: Secondary | ICD-10-CM

## 2015-06-14 DIAGNOSIS — I10 Essential (primary) hypertension: Secondary | ICD-10-CM | POA: Diagnosis not present

## 2015-06-14 DIAGNOSIS — E119 Type 2 diabetes mellitus without complications: Secondary | ICD-10-CM | POA: Diagnosis not present

## 2015-06-14 DIAGNOSIS — F0390 Unspecified dementia without behavioral disturbance: Secondary | ICD-10-CM | POA: Diagnosis not present

## 2015-06-14 DIAGNOSIS — R05 Cough: Secondary | ICD-10-CM | POA: Diagnosis not present

## 2015-06-14 NOTE — Progress Notes (Signed)
Patient ID: [name], female   DOB: [birth year], 82 y.o.   MRN: [id number]  Location:  [facility]   Place of Service:  SNF (31)   Cyndee Brightly, MD  Patient Care Team: Ricard Dillon, MD as PCP - General (Internal Medicine)  Extended Emergency Contact Information Primary Emergency Contact: Enzor,Raymond Address: [place], [place] [address] [place] Phone: [phone] Relation: Other  Goals of care: Advanced Directive information Advanced Directives 06/14/2015  Does patient have an advance directive? Yes  Type of Advance Directive Out of facility DNR (pink MOST or yellow form)  Does patient want to make changes to advanced directive? No - Patient declined  Copy of advanced directive(s) in chart? Yes     Chief Complaint  Patient presents with  . Acute Visit  Secondary to cough.  Medical management of chronic medical issues including dementia-hypertension-diabetes type 2-seizure disorder-history of abdominal aortic aneurysm.    HPI:  Pt is a 82 y.o. female seen today for an acute visit for a cough-apparently some chest congestion.  Also medical management of chronic medical conditions as noted above.  Regards to her medical issues that are chronic these appear to have been stable she does have significant dementia with behaviors which makes exam at times difficult-she continues to have late about the facility in her wheelchair however and appears to do well with supportive care.  The pressures appear to be stable recently 124/56-112/66 she is on Norvasc 10 mg a day.  She is also on metoprolol 25 mg twice a day.  And clonidine 0.1 mg twice a day.  Regards diabetes type 2 her Actos was discontinued secondary to concerns of hypoglycemia nonetheless sugars appear to be stable ranging from 78-1 33 most recently.  Most acute issue is a cough rest for a wise patient has been quite stable for an extended period-.  There's also been some chest congestion-respiratory status appears stable but cough is somewhat unusual for her.     History reviewed. No pertinent past medical history. History reviewed. No pertinent past surgical history.  Allergies  Allergen Reactions  . Penicillins   Medications.  Metoprolol 75 mg twice a day.  Aspirin 81 mg daily.  Clonidine 0.1 mg twice a day.  Colace 100 mg daily.  Multivitamin daily.  Norvasc 10 mg daily.  Remeron 7.5 mg daily.  Keppra 250 mg daily at bedtime.  Tylenol 650 mg every 4 hours when necessary.  Tums 1 tablet twice a day.  Vitamin D 1000 units daily.  Zantac 75 mg twice a day.      Review of Systems--essentially unattainable secondary dementia please see history of present illness again she has a cough apparently some chest congestion no reports of shortness of breath fever chills appears to be at baseline otherwise per nursing  Immunization History  Administered Date(s) Administered  . Influenza-Unspecified 12/10/2013   Pertinent  Health Maintenance Due  Topic Date Due  . FOOT EXAM  11/04/1937  . OPHTHALMOLOGY EXAM  11/04/1937  . URINE MICROALBUMIN  11/04/1937  . DEXA SCAN  11/04/1992  . PNA vac Low Risk Adult (1 of 2 - PCV13) 11/04/1992  . HEMOGLOBIN A1C  08/24/2015  . INFLUENZA VACCINE  10/04/2015   No flowsheet data found. Functional Status Survey:    Filed Vitals:   06/14/15 1216  BP: 124/56  Pulse: 64  Temp: 98 F (36.7 C)  TempSrc: Oral  Resp: 18  Weight: 82 lb (37.195 kg)   There is no height on file to calculate BMI. Physical Exam   In general this a frail female in no distress resting comfortably in bed however she is agitated with exam.  Her skin is warm and dry.  Oropharynx difficult to assess secondary to patient not really opening her mouth.  Head ears eyes nose mouth and throat has prescription lenses I do not see any active drainage from the eyes.  Chest is a limited exam patient did  not follow verbal instruction  and agitated  some congestion noted there is no labored breathing or sign of distress  Cardiac limited exam was regular rate and rhythm from what I could hear radial pulse was intact and regular she does not have significant lower extremity edema.  Abdomen is soft does not appear to be tender although this is limited secondary to patient agitation bowel sounds appear to be positive.  Muscle skeletal is moving all are externally's 4 at baseline strength appears to be intact.  Neurologic I don't see any lateralizing findings.  Psych she is oriented to self only agitated with exam which limits the exam today  Labs reviewed:  Recent Labs  11/17/14 0700 02/23/15 0715 05/02/15 0630  NA 140 141 144  K 4.2 3.9 4.0  CL 107 108 108  CO2 '29 28 30  '$ GLUCOSE 90 92 95  BUN 26* 19 28*  CREATININE 0.82 0.75 0.91  CALCIUM 8.5* 8.7* 8.8*    Recent Labs  11/17/14 0700 02/23/15 0715 05/02/15 0630  AST '19 17 18  '$ ALT 15 11* 14  ALKPHOS 52 54 48  BILITOT 0.7 0.5 0.4  PROT 6.4* 6.3* 6.4*  ALBUMIN 3.3* 3.2* 3.2*    Recent Labs  09/02/14 0745 11/17/14 0700 02/23/15 0715 05/02/15 0630  WBC 4.9 5.2 4.5 6.0  NEUTROABS 3.3  --  2.8 3.8  HGB 13.1 13.3 12.2 13.3  HCT 40.2 40.2 36.9 40.4  MCV 93.9 93.9 92.5 92.4  PLT 127* 132* 133* 155   Lab Results  Component Value Date   TSH 3.889 05/02/2015   Lab Results  Component Value Date   HGBA1C 6.1* 02/23/2015   No results found for: CHOL, HDL, LDLCALC, LDLDIRECT, TRIG, CHOLHDL  Significant Diagnostic Results in last 30 days:  No results found.  Assessment/Plan  #1 cough with chest congestion-patient would be a poor candidate for an x-ray suspect there will been limited cooperation this would be essentially impossible-will treat aggressively with the congestion since this is unusual for her--Suspected element of bronchitis here will give her a short course of Levaquin 500 mg 1 date 150 mg for 6 additional  days as well as a probiotic for 10 days twice a day and monitor.  Also she is receiving Robitussin-DM will change routine every 6 hours for 48 hours and then when necessary.  #2 history of dementia with behavior disturbances the appears to be at baseline appears to be doing relatively well with supportive care currently on no medication.  #3-history diabetes type 2 this appears stable as noted above she is not on any agent currently.  #4-history hypertension in this appears stable on Norvasc clonidine and metoprolol recent blood pressures 124/56-112/66.  #5 history of GERD she is on Zantac this appears to be well controlled.  #6 history of hypothyroidism recent T4 was 0.91 and TSH 3.889 will update this she has had variable TSHs in the past.  #7-she does have a history of  abdominal aortic aneurysm in the past and initially an ultrasound was ordered some time ago however it was nondiagnostic and obscured by bowel gas cirrhosis suggestion for CT follow-up which was ordered-torsade patient was quite agitated and this could not be accomplished-this point it was felt there was no value further diagnostic imaging patient would be  poor candidate for any aggressive workup-with a history of great anxiety with attempts at obtaining any further follow-up studies including a CT scan.  This has been previously discussed with Dr. Dellia Nims as well as with her husband who agreed with the nonaggressive follow-up  #8 history seizure disorder she continues on Keppra this has been stable for an extended period of time.  #9 history of anemia this has been stable as well with a hemoglobin of 13.3 on lab done in late February.  #10 history coronary artery disease this is been relatively asymptomatic she is on aspirin.  #11 history of CHF-this is been stable she was taken off the Aldactone because of renal insufficiency clinically appears to be stale  no evidence of increased edema-  [id number]-of note greater than  35 minutes spent assessing patient-discussing her status with nursing staff-reviewing her chart-iandcoordinating and formulating a plan of care for numerous diagnoses-of note greater than 50% of time spent coordinating plan of care   T

## 2015-06-15 ENCOUNTER — Encounter (HOSPITAL_COMMUNITY)
Admission: RE | Admit: 2015-06-15 | Discharge: 2015-06-15 | Disposition: A | Discharge status: Home / Self Care | Payer: Medicare Other | Source: Skilled Nursing Facility | Attending: Internal Medicine | Admitting: Internal Medicine

## 2015-06-15 DIAGNOSIS — M810 Age-related osteoporosis without current pathological fracture: Secondary | ICD-10-CM | POA: Diagnosis present

## 2015-06-15 DIAGNOSIS — F419 Anxiety disorder, unspecified: Secondary | ICD-10-CM | POA: Insufficient documentation

## 2015-06-15 DIAGNOSIS — R293 Abnormal posture: Secondary | ICD-10-CM | POA: Diagnosis present

## 2015-06-15 DIAGNOSIS — I10 Essential (primary) hypertension: Secondary | ICD-10-CM | POA: Insufficient documentation

## 2015-06-15 DIAGNOSIS — E119 Type 2 diabetes mellitus without complications: Secondary | ICD-10-CM | POA: Insufficient documentation

## 2015-06-15 LAB — CBC WITH DIFFERENTIAL/PLATELET
BASOS ABS: 0 10*3/uL (ref 0.0–0.1)
Basophils Relative: 0 %
Eosinophils Absolute: 0 10*3/uL (ref 0.0–0.7)
Eosinophils Relative: 0 %
HEMATOCRIT: 37.4 % (ref 36.0–46.0)
HEMOGLOBIN: 12.3 g/dL (ref 12.0–15.0)
LYMPHS PCT: 33 %
Lymphs Abs: 1.2 10*3/uL (ref 0.7–4.0)
MCH: 30.3 pg (ref 26.0–34.0)
MCHC: 32.9 g/dL (ref 30.0–36.0)
MCV: 92.1 fL (ref 78.0–100.0)
MONO ABS: 0.5 10*3/uL (ref 0.1–1.0)
Monocytes Relative: 14 %
Neutro Abs: 1.8 10*3/uL (ref 1.7–7.7)
Neutrophils Relative %: 53 %
Platelets: 101 10*3/uL — ABNORMAL LOW (ref 150–400)
RBC: 4.06 MIL/uL (ref 3.87–5.11)
RDW: 13.7 % (ref 11.5–15.5)
WBC: 3.5 10*3/uL — ABNORMAL LOW (ref 4.0–10.5)

## 2015-06-15 LAB — BASIC METABOLIC PANEL
ANION GAP: 6 (ref 5–15)
BUN: 22 mg/dL — AB (ref 6–20)
CHLORIDE: 108 mmol/L (ref 101–111)
CO2: 30 mmol/L (ref 22–32)
Calcium: 8.5 mg/dL — ABNORMAL LOW (ref 8.9–10.3)
Creatinine, Ser: 0.78 mg/dL (ref 0.44–1.00)
GFR calc Af Amer: >60 mL/min (ref >60)
GFR calc non Af Amer: >60 mL/min (ref >60)
Glucose, Bld: 99 mg/dL (ref 65–99)
POTASSIUM: 3.9 mmol/L (ref 3.5–5.1)
SODIUM: 144 mmol/L (ref 135–145)

## 2015-06-15 LAB — TSH: TSH: 4.718 u[IU]/mL — ABNORMAL HIGH (ref 0.350–4.500)

## 2015-06-15 LAB — T4, FREE: Free T4: 1.09 ng/dL (ref 0.61–1.12)

## 2015-06-19 NOTE — Progress Notes (Signed)
Patient ID: [name], female   DOB: 1925-05-11, 82 y.o.   MRN: [id number]

## 2015-07-14 ENCOUNTER — Non-Acute Institutional Stay (SKILLED_NURSING_FACILITY): Payer: Medicare Other | Admitting: Internal Medicine

## 2015-07-14 ENCOUNTER — Encounter: Payer: Self-pay | Admitting: Internal Medicine

## 2015-07-14 DIAGNOSIS — R7989 Other specified abnormal findings of blood chemistry: Secondary | ICD-10-CM

## 2015-07-14 DIAGNOSIS — D638 Anemia in other chronic diseases classified elsewhere: Secondary | ICD-10-CM | POA: Diagnosis not present

## 2015-07-14 DIAGNOSIS — I10 Essential (primary) hypertension: Secondary | ICD-10-CM

## 2015-07-14 DIAGNOSIS — M810 Age-related osteoporosis without current pathological fracture: Secondary | ICD-10-CM | POA: Diagnosis not present

## 2015-07-14 DIAGNOSIS — D696 Thrombocytopenia, unspecified: Secondary | ICD-10-CM | POA: Diagnosis not present

## 2015-07-14 DIAGNOSIS — I714 Abdominal aortic aneurysm, without rupture, unspecified: Secondary | ICD-10-CM

## 2015-07-14 DIAGNOSIS — R7303 Prediabetes: Secondary | ICD-10-CM | POA: Diagnosis not present

## 2015-07-14 DIAGNOSIS — R946 Abnormal results of thyroid function studies: Secondary | ICD-10-CM | POA: Diagnosis not present

## 2015-07-14 NOTE — Assessment & Plan Note (Signed)
Aneurysm clinically is not large enough to address and she would not be a surgical candidate because of her advanced age and severe dementia

## 2015-07-14 NOTE — Assessment & Plan Note (Signed)
CBC and differential

## 2015-07-14 NOTE — Assessment & Plan Note (Signed)
Recheck A1c 

## 2015-07-14 NOTE — Progress Notes (Signed)
Patient ID: [name], female   DOB: 01-08-1926, 82 y.o.   MRN: [id number]   Facility Location: [facility] Room Number: 105-D  This is a nursing facility follow up of chronic medical diagnoses.  Interim medical record and care since last [facility] visit was updated with review of diagnostic studies and change in clinical status since last visit were documented.  HPI: She has medical diagnoses of dementia, hypertension, diabetes, osteoporosis, seizure disorder, congestive heart failure, anemia, and elevated TSH. Her most recent labs were 06/15/15. Glucose was 99. The last A1c on record was prediabetic at 6.1% on 02/23/15. Her renal function has been normal despite a history of hypertension.  She has not demonstrated any anemia on labs going back to 11/17/14. She had mild thrombocytopenia with platelet counts as low as 101,000. Additionally she's had low white count to 3500 but normal differential. Her TSH has been elevated up to 5.8; but free T4 has been normal. She's been a resident of this facility for several years.  Her Epic chart contains no medical, role, family, or smoking history. Her husband who also has dementia is a resident of facility as well.  Comprehensive review of systems: No seizures reported. No bleeding dyscrasias. She wanders constantly in her wheelchair. An ankle bracelet has been attached because of risk of leaving the facility. The staff describes that she has a tendency to be somewhat combative on occasion.  Physical exam: Pertinent or positive findings:She is very frail individual, sitting in the wheelchair.  She cannot follow even simple commands. Her speech pattern is one of garbled sounds without any discernible meaning. She has bitemporal as well as limb wasting. Minor rales are present. Aortic aneurysm cannot be defined; but she resist examination in general. Pedal pulses are decreased.  General appearance:Adequately nourished; no  acute distress , increased work of breathing is present.   Lymphatic: No lymphadenopathy about the head, neck, axilla . Eyes: No conjunctival inflammation or lid edema is present. There is no scleral icterus. Ears:  External ear exam shows no significant lesions or deformities.   Nose:  External nasal examination shows no deformity or inflammation. Nasal mucosa are pink and moist without lesions ,exudates Oral exam:exam resisted Neck:  No thyromegaly, masses, tenderness noted.    Heart:  Normal rate and regular rhythm. S1 and S2 normal without gallop, murmur, click, rub .  Abdomen:Bowel sounds are normal. Abdomen is soft and nontender with no organomegaly, hernias,masses. GU: deferred. Extremities:  No  clubbing,edema  Neurologic exam : Strength decreased  in upper & lower extremities Balance,Rhomberg,finger to nose testing could not be completed due to clinical state Skin: Warm & dry w/o tenting. No significant lesions or rash.    See summary under each active problem in the Problem List with associated updated therapeutic plan

## 2015-07-14 NOTE — Assessment & Plan Note (Signed)
Repeat TSH in August 2017; TSH goal is less than 5.5

## 2015-07-14 NOTE — Patient Instructions (Signed)
New orders for [employer] entry. CBC and differential  A1c

## 2015-07-14 NOTE — Assessment & Plan Note (Signed)
BMET 

## 2015-07-15 ENCOUNTER — Encounter (HOSPITAL_COMMUNITY)
Admission: AD | Admit: 2015-07-15 | Discharge: 2015-07-15 | Disposition: A | Discharge status: Home / Self Care | Payer: Medicare Other | Source: Skilled Nursing Facility | Attending: Internal Medicine | Admitting: Internal Medicine

## 2015-07-15 DIAGNOSIS — D509 Iron deficiency anemia, unspecified: Secondary | ICD-10-CM | POA: Diagnosis present

## 2015-07-15 DIAGNOSIS — I10 Essential (primary) hypertension: Secondary | ICD-10-CM | POA: Diagnosis present

## 2015-07-15 LAB — CBC WITH DIFFERENTIAL/PLATELET
Basophils Absolute: 0 10*3/uL (ref 0.0–0.1)
Basophils Relative: 0 %
EOS PCT: 3 %
Eosinophils Absolute: 0.2 10*3/uL (ref 0.0–0.7)
HCT: 40.3 % (ref 36.0–46.0)
Hemoglobin: 13.2 g/dL (ref 12.0–15.0)
LYMPHS ABS: 1.2 10*3/uL (ref 0.7–4.0)
LYMPHS PCT: 20 %
MCH: 30.2 pg (ref 26.0–34.0)
MCHC: 32.8 g/dL (ref 30.0–36.0)
MCV: 92.2 fL (ref 78.0–100.0)
MONO ABS: 0.6 10*3/uL (ref 0.1–1.0)
Monocytes Relative: 11 %
Neutro Abs: 4 10*3/uL (ref 1.7–7.7)
Neutrophils Relative %: 66 %
PLATELETS: 154 10*3/uL (ref 150–400)
RBC: 4.37 MIL/uL (ref 3.87–5.11)
RDW: 14.3 % (ref 11.5–15.5)
WBC: 6 10*3/uL (ref 4.0–10.5)

## 2015-07-16 LAB — HEMOGLOBIN A1C
Hgb A1c MFr Bld: 6.8 % — ABNORMAL HIGH (ref 4.8–5.6)
MEAN PLASMA GLUCOSE: 148 mg/dL

## 2015-08-23 ENCOUNTER — Encounter: Payer: Self-pay | Admitting: Internal Medicine

## 2015-08-23 ENCOUNTER — Non-Acute Institutional Stay (SKILLED_NURSING_FACILITY): Payer: Medicare Other | Admitting: Internal Medicine

## 2015-08-23 DIAGNOSIS — I10 Essential (primary) hypertension: Secondary | ICD-10-CM | POA: Diagnosis not present

## 2015-08-23 DIAGNOSIS — R569 Unspecified convulsions: Secondary | ICD-10-CM

## 2015-08-23 DIAGNOSIS — F0391 Unspecified dementia with behavioral disturbance: Secondary | ICD-10-CM

## 2015-08-23 DIAGNOSIS — D649 Anemia, unspecified: Secondary | ICD-10-CM | POA: Diagnosis not present

## 2015-08-23 NOTE — Progress Notes (Signed)
Location:  [facility] Room Number: 105/D Place of Service:  SNF (31) Provider:  Leeanne Deed, MD  Patient Care Team: Hendricks Limes, MD as PCP - General (Internal Medicine)  Extended Emergency Contact Information Primary Emergency Contact: Beidler,Raymond Address: [place], [place] [address] [place] Phone: [phone] Relation: Other  Code Status:  DNR Goals of care: Advanced Directive information Advanced Directives 08/23/2015  Does patient have an advance directive? Yes  Type of Advance Directive Out of facility DNR (pink MOST or yellow form)  Does patient want to make changes to advanced directive? No - Patient declined  Copy of advanced directive(s) in chart? Yes     Chief Complaint  Patient presents with  . Medical Management of Chronic Issues    Routine visit   Medical management of chronic issues including dementia-hypertension-anemia of chronic disease-osteoporosis-history seizure disorder--diabetes type 2  HPI:  Pt is a 82 y.o. female for medical management of above-stated issues-she continues to be quite stable-she has severe dementia but is doing well with supportive care appears her weight is relatively stable.  Nursing does not report any recent acute issues.  She does have a listed history diabetes however she is been taken off her oral agent in her blood sugars appear to be stable ranging from 79 up to 144 recently hemoglobin A1c 6.8 on lab done on 07/15/2015.  She does have a listed history of anemia but this is been stable for some time most recently 13.2 on lab done back in May as well area  She has a history of mild thrombocytopenia has had platelet count is low is 101,000 this shows quite variability also was 154,000 on lab done in May  At times she's had an elevated TSH mildly most recent T4 was within normal ranges 1.09 TSH was mildly elevated at 4.718 update TSH is  pending for August.  He does have a history of hypertension is on clonidine 0.1 mg twice a day as well as Lopressor 25 mg twice a day and Norvasc 10 mg a day recent blood pressures appear stable at 110/60-116/66-118/78.  Previous medical history.  Includes dementia with behaviors.  Hypertension.  pre diabetes.  Anemia.  Thrombocytopenia.  Osteoporosis.  History of abdominal aortic aneurysm.        Allergies  Allergen Reactions  . Penicillins     Current Outpatient Prescriptions on File Prior to Visit  Medication Sig Dispense Refill  . amLODipine (NORVASC) 10 MG tablet Take 10 mg by mouth daily. For HTN    . aspirin 81 MG tablet Take 81 mg by mouth daily.    . calcium carbonate (TUMS - DOSED IN MG ELEMENTAL CALCIUM) 500 MG chewable tablet Chew 1 tablet by mouth 2 (two) times daily.    . cholecalciferol (VITAMIN D) 1000 UNITS tablet Take 1,000 Units by mouth daily.    . cloNIDine (CATAPRES) 0.1 MG tablet Take 0.1 mg by mouth 2 (two) times daily.    Marland Kitchen docusate sodium (COLACE) 100 MG capsule Take 100 mg by mouth daily.    Marland Kitchen ENSURE (ENSURE) Take 237 mLs by mouth 3 (three) times daily between meals.    . levETIRAcetam (KEPPRA) 250 MG tablet Take 250 mg by mouth at bedtime. ( 2.5 ml ) oral    . metoprolol tartrate (LOPRESSOR) 25 MG tablet Take 3 tabs-equal 75 mg by mouth twice a day    .  mirtazapine (REMERON) 7.5 MG tablet Take 7.5 mg by mouth at bedtime.    . Multiple Vitamin (MULTIVITAMIN) tablet Take 1 tablet by mouth daily.    . ranitidine (ZANTAC) 150 MG tablet Take 75 mg by mouth 2 (two) times daily.     No current facility-administered medications on file prior to visit.    Review of Systems  Essentially unattainable secondary to severe dementia-again nursing does not report any recent issues no increased shortness of breath cough congestion seizures-please see history of present illness  Immunization History  Administered Date(s) Administered  .  Influenza-Unspecified 12/10/2013  . Pneumococcal-Unspecified 12/17/2005   Pertinent  Health Maintenance Due  Topic Date Due  . FOOT EXAM  08/22/2016 (Originally 11/04/1935)  . OPHTHALMOLOGY EXAM  08/22/2016 (Originally 11/04/1935)  . URINE MICROALBUMIN  08/22/2016 (Originally 11/04/1935)  . DEXA SCAN  08/22/2016 (Originally 11/04/1992)  . PNA vac Low Risk Adult (2 of 2 - PCV13) 08/22/2016 (Originally 12/18/2006)  . INFLUENZA VACCINE  10/04/2015  . HEMOGLOBIN A1C  01/15/2016   No flowsheet data found. Functional Status Survey:    Filed Vitals:   08/23/15 1556  BP: 154/46  Pulse: 69  Temp: 98.2 F (36.8 C)  TempSrc: Oral  Resp: 18  Weight: 82 lb (37.195 kg)  Of note most recent blood pressures as noted above-110/60-116/66-118/78 There is no height on file to calculate BMI. Physical Exam   In general this is a frail elderly female in no distress she is agitated with exam this evening which has limited the exam.  Her skin is warm and dry she does have bitemporal and limb wasting which is baseline.  Eyes pupils appear reactive to light this is somewhat difficult to fully assess secondary to agitation.  Chest is clear to auscultation with poor respiratory effort no labored breathing.  Heart could not really be assessed secondary to patient agitation radial pulse is regular.  She does not appear to have significant lower extremity edema.  Abdomen appears to be soft nontender was difficult to auscultate for bowel sounds secondary to patient agitation.  Muscle skeletal is able to move all extremities 4 at baseline she has again effuse muscle wasting but strength appears to be relatively intact she continues to ambulate about facility in her wheelchair.  Neurologic is grossly intact I could not really appreciate any lateralizing findings.  Her speech is garbled which is her baseline  Labs reviewed:  Recent Labs  02/23/15 0715 05/02/15 0630 06/15/15 0705  NA 141 144 144  K 3.9  4.0 3.9  CL 108 108 108  CO2 '28 30 30  '$ GLUCOSE 92 95 99  BUN 19 28* 22*  CREATININE 0.75 0.91 0.78  CALCIUM 8.7* 8.8* 8.5*    Recent Labs  11/17/14 0700 02/23/15 0715 05/02/15 0630  AST '19 17 18  '$ ALT 15 11* 14  ALKPHOS 52 54 48  BILITOT 0.7 0.5 0.4  PROT 6.4* 6.3* 6.4*  ALBUMIN 3.3* 3.2* 3.2*    Recent Labs  05/02/15 0630 06/15/15 0705 07/15/15 0705  WBC 6.0 3.5* 6.0  NEUTROABS 3.8 1.8 4.0  HGB 13.3 12.3 13.2  HCT 40.4 37.4 40.3  MCV 92.4 92.1 92.2  PLT 155 101* 154   Lab Results  Component Value Date   TSH 4.718* 06/15/2015   Lab Results  Component Value Date   HGBA1C 6.8* 07/15/2015   No results found for: CHOL, HDL, LDLCALC, LDLDIRECT, TRIG, CHOLHDL  Significant Diagnostic Results in last 30 days:  No results found.  Assessment/Plan   #1-history of hypertension again this appears stable on current medications including clonidine Norvasc and Lopressor at this point will monitor.--Renal function appears to be stable will update  #2 history of pre-diabetes hemoglobin A1c 6.8 showed relative stability CBGs remain satisfactory despite not being on any medication.  #3 history of elevated TSH again last T4 was within normal range at 1.09 back in April 2017 TSH mildly elevated at 4.718 updated TSH in August.  #4 thrombocytopenia this appears to be intermittent last month platelets were up to 154,000 low update this.  #5 anemia this is been stable for some time most recent hemoglobin 13.2 Will update this as well with a CBC.  #6 history of abdominal aortic aneurysm her previous assessments she is not a candidate for any aggressive surgical intervention because of her advanced age and severe dementia last study did not show the aneurysm was large enough to address-  7-history seizure disorder she is on Keppra this has been stable for extended period of time with no seizures noted.  #8 history of osteoporosis she continues on calcium supplementation with Tums  as well as vitamin D.--Will update vitamin D level  #9-history of GERD continues on Zantac this has been stable.  #10 history of dementia with behaviors-this appears to be stable she does get agitated with invasive maneuvers as experience this evening otherwise though she appears to be calm largely ambulates in a wheelchair about facility and as well as supportive care her weight appears to be relatively stable in the low 80s she is on Remeron.  [place], [place], [place]

## 2015-08-24 ENCOUNTER — Encounter (HOSPITAL_COMMUNITY)
Admission: RE | Admit: 2015-08-24 | Discharge: 2015-08-24 | Disposition: A | Discharge status: Home / Self Care | Payer: Medicare Other | Source: Skilled Nursing Facility | Attending: Internal Medicine | Admitting: Internal Medicine

## 2015-08-24 DIAGNOSIS — R569 Unspecified convulsions: Secondary | ICD-10-CM | POA: Insufficient documentation

## 2015-08-24 DIAGNOSIS — Z9181 History of falling: Secondary | ICD-10-CM | POA: Insufficient documentation

## 2015-08-24 DIAGNOSIS — M810 Age-related osteoporosis without current pathological fracture: Secondary | ICD-10-CM | POA: Insufficient documentation

## 2015-08-24 DIAGNOSIS — R293 Abnormal posture: Secondary | ICD-10-CM | POA: Diagnosis present

## 2015-08-24 DIAGNOSIS — I10 Essential (primary) hypertension: Secondary | ICD-10-CM | POA: Insufficient documentation

## 2015-08-24 DIAGNOSIS — E119 Type 2 diabetes mellitus without complications: Secondary | ICD-10-CM | POA: Diagnosis present

## 2015-08-24 DIAGNOSIS — M6281 Muscle weakness (generalized): Secondary | ICD-10-CM | POA: Diagnosis present

## 2015-08-24 LAB — CBC WITH DIFFERENTIAL/PLATELET
BASOS ABS: 0 10*3/uL (ref 0.0–0.1)
Basophils Relative: 0 %
EOS ABS: 0.1 10*3/uL (ref 0.0–0.7)
EOS PCT: 2 %
HCT: 38.1 % (ref 36.0–46.0)
Hemoglobin: 12.6 g/dL (ref 12.0–15.0)
Lymphocytes Relative: 23 %
Lymphs Abs: 1.3 10*3/uL (ref 0.7–4.0)
MCH: 30.3 pg (ref 26.0–34.0)
MCHC: 33.1 g/dL (ref 30.0–36.0)
MCV: 91.6 fL (ref 78.0–100.0)
Monocytes Absolute: 0.7 10*3/uL (ref 0.1–1.0)
Monocytes Relative: 11 %
Neutro Abs: 3.7 10*3/uL (ref 1.7–7.7)
Neutrophils Relative %: 64 %
PLATELETS: 164 10*3/uL (ref 150–400)
RBC: 4.16 MIL/uL (ref 3.87–5.11)
RDW: 14.4 % (ref 11.5–15.5)
WBC: 5.8 10*3/uL (ref 4.0–10.5)

## 2015-08-24 LAB — COMPREHENSIVE METABOLIC PANEL
ALT: 15 U/L (ref 14–54)
AST: 16 U/L (ref 15–41)
Albumin: 3.2 g/dL — ABNORMAL LOW (ref 3.5–5.0)
Alkaline Phosphatase: 56 U/L (ref 38–126)
Anion gap: 4 — ABNORMAL LOW (ref 5–15)
BUN: 33 mg/dL — AB (ref 6–20)
CHLORIDE: 107 mmol/L (ref 101–111)
CO2: 30 mmol/L (ref 22–32)
CREATININE: 0.71 mg/dL (ref 0.44–1.00)
Calcium: 8.5 mg/dL — ABNORMAL LOW (ref 8.9–10.3)
GFR calc Af Amer: >60 mL/min (ref >60)
GFR calc non Af Amer: >60 mL/min (ref >60)
Glucose, Bld: 115 mg/dL — ABNORMAL HIGH (ref 65–99)
Potassium: 3.8 mmol/L (ref 3.5–5.1)
SODIUM: 141 mmol/L (ref 135–145)
Total Bilirubin: 0.6 mg/dL (ref 0.3–1.2)
Total Protein: 6.8 g/dL (ref 6.5–8.1)

## 2015-08-24 LAB — BASIC METABOLIC PANEL
BUN: 33 mg/dL — AB (ref 4–21)
CREATININE: 0.7 mg/dL (ref <=1.1)
Glucose: 115 mg/dL
Sodium: 141 mmol/L (ref 137–147)

## 2015-08-24 LAB — CBC AND DIFFERENTIAL: WBC: 5.8 10*3/mL

## 2015-08-24 LAB — HEPATIC FUNCTION PANEL: Bilirubin, Total: 0.6 mg/dL

## 2015-08-25 LAB — VITAMIN D 25 HYDROXY (VIT D DEFICIENCY, FRACTURES): VIT D 25 HYDROXY: 56.3 ng/mL (ref 30.0–100.0)

## 2015-08-30 ENCOUNTER — Other Ambulatory Visit (HOSPITAL_COMMUNITY)
Admission: AD | Admit: 2015-08-30 | Discharge: 2015-08-30 | Disposition: A | Discharge status: Home / Self Care | Payer: Medicare Other | Source: Skilled Nursing Facility | Attending: Internal Medicine | Admitting: Internal Medicine

## 2015-08-30 DIAGNOSIS — N390 Urinary tract infection, site not specified: Secondary | ICD-10-CM | POA: Insufficient documentation

## 2015-08-30 LAB — URINALYSIS, ROUTINE W REFLEX MICROSCOPIC
Bilirubin Urine: NEGATIVE
GLUCOSE, UA: NEGATIVE mg/dL
KETONES UR: NEGATIVE mg/dL
LEUKOCYTES UA: NEGATIVE
NITRITE: NEGATIVE
PROTEIN: NEGATIVE mg/dL
Specific Gravity, Urine: >1.03 — ABNORMAL HIGH (ref 1.005–1.030)
pH: 5.5 (ref 5.0–8.0)

## 2015-08-30 LAB — URINE MICROSCOPIC-ADD ON

## 2015-09-01 LAB — URINE CULTURE

## 2015-09-19 LAB — BASIC METABOLIC PANEL: GLUCOSE: 113 mg/dL

## 2015-09-21 LAB — BASIC METABOLIC PANEL: Glucose: 71 mg/dL

## 2015-09-22 ENCOUNTER — Non-Acute Institutional Stay (SKILLED_NURSING_FACILITY): Payer: Medicare Other | Admitting: Internal Medicine

## 2015-09-22 ENCOUNTER — Encounter: Payer: Self-pay | Admitting: Internal Medicine

## 2015-09-22 DIAGNOSIS — I10 Essential (primary) hypertension: Secondary | ICD-10-CM

## 2015-09-22 DIAGNOSIS — R946 Abnormal results of thyroid function studies: Secondary | ICD-10-CM | POA: Diagnosis not present

## 2015-09-22 DIAGNOSIS — I509 Heart failure, unspecified: Secondary | ICD-10-CM | POA: Diagnosis not present

## 2015-09-22 DIAGNOSIS — D649 Anemia, unspecified: Secondary | ICD-10-CM | POA: Diagnosis not present

## 2015-09-22 DIAGNOSIS — R7303 Prediabetes: Secondary | ICD-10-CM | POA: Diagnosis not present

## 2015-09-22 DIAGNOSIS — R7989 Other specified abnormal findings of blood chemistry: Secondary | ICD-10-CM

## 2015-09-22 NOTE — Assessment & Plan Note (Signed)
Clinically no evidence of congestive heart failure at this time No change indicated

## 2015-09-22 NOTE — Assessment & Plan Note (Signed)
Blood pressure adequately controlled Renal function is normal No change indicated

## 2015-09-22 NOTE — Assessment & Plan Note (Signed)
No anemia or thrombocytopenia present 08/24/15 ; no labs indicated

## 2015-09-22 NOTE — Patient Instructions (Signed)
Check TSH 

## 2015-09-22 NOTE — Assessment & Plan Note (Signed)
Recheck TSH 

## 2015-09-22 NOTE — Progress Notes (Signed)
   [facility] Room: 105 DNR  Chief Complaint  Patient presents with  . Medical Management of Chronic Issues    Medical Management of Chronic issues   Allergies  Allergen Reactions  . Penicillins    This is a nursing facility follow up of chronic medical diagnoses  Interim medical record and care since last [place] visit was updated with review of diagnostic studies and change in clinical status since last visit were documented.  HPI:The patient has medical diagnoses of hypertension, seizure disorder, GERD, vitamin D deficiency,coronary artery disease,congestive heart failure, and dementia. She has had elevated TSH without diagnosis of hypothyroidism. TSH has varied from 3.89-5.80. Intermittently she's been anemic and has exhibited thrombocytopenia. She has exhibited hyperglycemia with a glucose range of 71-115.  A1c was 6.1% in 12/16. Labs have been done within the last month. 6/21 .She exhibited mild azotemia with a BUN of 33. Creatinine was normal at 0.71. Albumin is low at 3.2. At that time she was not anemic and platelet count was normal. Vitamin D level was therapeutic.  Review of systems could not be completed due to severe dementia  Physical exam:  Pertinent or positive findings: She appears her stated age and appears veryd frail. She sits in the wheelchair with minimal spontaneous activity. He does not communicate; but when questioned or examined she begins to babble incoherently. Proptosis is present bilaterally. Oral exam is limited but severe caries noted of the mandibular teeth which were visualized Thyroid is small. Heart rhythm is regular and rate is slow. Pedal pulses are decreased. She is wearing an ankle bracelet to prevent elopement. There is marked atrophy of the limbs as well as the temples. General appearance:Adequately nourished; no acute distress , increased work of breathing is present.   Lymphatic: No lymphadenopathy about the head, neck,  axilla . Eyes: No conjunctival inflammation or lid edema is present. There is no scleral icterus. Ears:  External ear exam shows no significant lesions or deformities.   Nose:  External nasal examination shows no deformity or inflammation. Nasal mucosa are pink and moist without lesions ,exudates Neck:  No thyromegaly, masses, tenderness noted.    Heart:  No gallop, murmur, click, rub .  Lungs: Breath sounds decreased. Chest clear to auscultation without wheezes, rhonchi,rales , rubs. Abdomen:Bowel sounds are normal. Abdomen is soft and nontender with no organomegaly, hernias,masses. GU: deferred . Extremities:  No cyanosis, clubbing,edema  Neurologic exam : Strength decreased in upper & lower extremities Balance,Rhomberg,finger to nose testing could not be completed due to clinical state Skin: Warm & dry w/o tenting. No significant lesions or rash.    See summary under each active problem in the Problem List with associated updated therapeutic plan

## 2015-09-22 NOTE — Assessment & Plan Note (Signed)
With only mild hyperglycemia ,A1c does not need to be repeated

## 2015-09-23 ENCOUNTER — Encounter (HOSPITAL_COMMUNITY)
Admission: RE | Admit: 2015-09-23 | Discharge: 2015-09-23 | Disposition: A | Discharge status: Home / Self Care | Payer: Medicare Other | Source: Skilled Nursing Facility | Attending: Internal Medicine | Admitting: Internal Medicine

## 2015-09-23 ENCOUNTER — Non-Acute Institutional Stay (SKILLED_NURSING_FACILITY): Payer: Medicare Other | Admitting: Internal Medicine

## 2015-09-23 DIAGNOSIS — E038 Other specified hypothyroidism: Secondary | ICD-10-CM

## 2015-09-23 DIAGNOSIS — R293 Abnormal posture: Secondary | ICD-10-CM | POA: Diagnosis present

## 2015-09-23 DIAGNOSIS — M6281 Muscle weakness (generalized): Secondary | ICD-10-CM | POA: Insufficient documentation

## 2015-09-23 DIAGNOSIS — Z9181 History of falling: Secondary | ICD-10-CM | POA: Diagnosis not present

## 2015-09-23 DIAGNOSIS — E060 Acute thyroiditis: Secondary | ICD-10-CM | POA: Diagnosis present

## 2015-09-23 DIAGNOSIS — M810 Age-related osteoporosis without current pathological fracture: Secondary | ICD-10-CM | POA: Insufficient documentation

## 2015-09-23 DIAGNOSIS — I10 Essential (primary) hypertension: Secondary | ICD-10-CM | POA: Diagnosis not present

## 2015-09-23 LAB — TSH: TSH: 6.896 u[IU]/mL — AB (ref 0.350–4.500)

## 2015-09-23 NOTE — Progress Notes (Signed)
Location:      Place of Service:  SNF (31) Provider:  Leeanne Deed, MD  Patient Care Team: Hendricks Limes, MD as PCP - General (Internal Medicine)  Extended Emergency Contact Information Primary Emergency Contact: Bellisario,Raymond Address: [place],  [address] [place] Phone: [phone] Relation: Other  Code Status:  DNR Goals of care: Advanced Directive information Advanced Directives 09/22/2015  Does patient have an advance directive? Yes  Type of Advance Directive Out of facility DNR (pink MOST or yellow form)  Does patient want to make changes to advanced directive? No - Patient declined  Copy of advanced directive(s) in chart? Yes      Chief complaint-acute visit follow-up hypothyroidism.    HPI:  Pt is a 82 y.o. female for an elevated TSH-it is 6.896 on most recent lab done today.  Previous TSH was 4.718--3 months ago when it was 3.8894 months ago.  She is not on any medication currently  Patient has severe dementia and cannot really give any review of systems however per nursing she is at her baseline ambulating about the facility in her wheelchair she does have periods of agitation which is not new but appears to be doing relatively well with supportive care.     Previous medical history.  Includes dementia with behaviors.  Hypertension.  pre diabetes.  Anemia.  Thrombocytopenia.  Osteoporosis.  History of abdominal aortic aneurysm.        Allergies  Allergen Reactions  . Penicillins     Current Outpatient Prescriptions on File Prior to Visit  Medication Sig Dispense Refill  . amLODipine (NORVASC) 10 MG tablet Take 10 mg by mouth daily. For HTN    . aspirin 81 MG tablet Take 81 mg by mouth daily.    . calcium carbonate (TUMS - DOSED IN MG ELEMENTAL CALCIUM) 500 MG chewable tablet Chew 1 tablet by mouth 2 (two) times daily.    . cholecalciferol (VITAMIN D) 1000 UNITS  tablet Take 1,000 Units by mouth daily.    . cloNIDine (CATAPRES) 0.1 MG tablet Take 0.1 mg by mouth 2 (two) times daily.    Marland Kitchen docusate sodium (COLACE) 100 MG capsule Take 100 mg by mouth daily.    Marland Kitchen ENSURE (ENSURE) Take 237 mLs by mouth 3 (three) times daily between meals.    . levETIRAcetam (KEPPRA) 250 MG tablet Take 250 mg by mouth at bedtime. ( 2.5 ml ) oral    . metoprolol tartrate (LOPRESSOR) 25 MG tablet Take 3 tabs-equal 75 mg by mouth twice a day    . mirtazapine (REMERON) 7.5 MG tablet Take 7.5 mg by mouth at bedtime.    . Multiple Vitamin (MULTIVITAMIN) tablet Take 1 tablet by mouth daily.    . ranitidine (ZANTAC) 150 MG tablet Take 75 mg by mouth 2 (two) times daily.     No current facility-administered medications on file prior to visit.    Review of Systems  Essentially unattainable secondary to severe dementia- -please see history of present illness  Immunization History  Administered Date(s) Administered  . Influenza-Unspecified 12/10/2013  . Pneumococcal-Unspecified 12/17/2005   Pertinent  Health Maintenance Due  Topic Date Due  . FOOT EXAM  08/22/2016 (Originally 11/04/1935)  . OPHTHALMOLOGY EXAM  08/22/2016 (Originally 11/04/1935)  . URINE MICROALBUMIN  08/22/2016 (Originally 11/04/1935)  . DEXA SCAN  08/22/2016 (Originally 11/04/1992)  . PNA vac Low Risk Adult (2 of  2 - PCV13) 08/22/2016 (Originally 12/18/2006)  . INFLUENZA VACCINE  10/04/2015  . HEMOGLOBIN A1C  01/15/2016   No flowsheet data found. Functional Status Survey:       Physical Exam   She is afebrile pulse 80 respirations 18 blood pressure 114/78  In general this is a frail elderly female in no distress she is agitated with exam this evening which has limited the exam.  Her skin is warm and dry she does have bitemporal and limb wasting which is baseline.  Eyes pupils appear reactive to light this is somewhat difficult to fully assess secondary to agitation.  Chest is clear to auscultation with  poor respiratory effort no labored breathing.  Heart could not really be assessed secondary to patient agitation radial pulse is regular.  She does not appear to have significant lower extremity edema.  Abdomen appears to be soft nontender was difficult to auscultate for bowel sounds secondary to patient agitation.  Muscle skeletal is able to move all extremities 4 at baseline she has again diffuse muscle wasting but strength appears to be relatively intact she continues to ambulate about facility in her wheelchair.  Neurologic is grossly intact I could not really appreciate any lateralizing findings.  Her speech is garbled which is her baseline  Labs reviewed:  Recent Labs  05/02/15 0630 06/15/15 0705 08/24/15 08/24/15 0715  NA 144 144 141 141  K 4.0 3.9  --  3.8  CL 108 108  --  107  CO2 30 30  --  30  GLUCOSE 95 99  --  115*  BUN 28* 22* 33* 33*  CREATININE 0.91 0.78 0.7 0.71  CALCIUM 8.8* 8.5*  --  8.5*    Recent Labs  02/23/15 0715 05/02/15 0630 08/24/15 0715  AST '17 18 16  '$ ALT 11* 14 15  ALKPHOS 54 48 56  BILITOT 0.5 0.4 0.6  PROT 6.3* 6.4* 6.8  ALBUMIN 3.2* 3.2* 3.2*    Recent Labs  06/15/15 0705 07/15/15 0705 08/24/15 08/24/15 0715  WBC 3.5* 6.0 5.8 5.8  NEUTROABS 1.8 4.0  --  3.7  HGB 12.3 13.2  --  12.6  HCT 37.4 40.3  --  38.1  MCV 92.1 92.2  --  91.6  PLT 101* 154  --  164   Lab Results  Component Value Date   TSH 6.896* 09/23/2015   Lab Results  Component Value Date   HGBA1C 6.8* 07/15/2015   No results found for: CHOL, HDL, LDLCALC, LDLDIRECT, TRIG, CHOLHDL  Significant Diagnostic Results in last 30 days:  No results found.  Assessment/Plan  Hypothyroidism-we have been monitoring her TSH periodically-it now appears to be rising at 6.89-this has been assessed by Dr. Linna Darner  recommendation is to start Synthroid 25 g daily.  Update a TSH in 8 weeks.  Clinically she appears to be at baseline appears to be stablel with supportive  care  [phone]        .        Granville Lewis, PA-C [phone]

## 2015-09-24 ENCOUNTER — Other Ambulatory Visit: Payer: Self-pay | Admitting: Internal Medicine

## 2015-09-24 DIAGNOSIS — E039 Hypothyroidism, unspecified: Secondary | ICD-10-CM

## 2015-09-29 ENCOUNTER — Encounter (HOSPITAL_COMMUNITY)
Admission: RE | Admit: 2015-09-29 | Discharge: 2015-09-29 | Disposition: A | Discharge status: Home / Self Care | Payer: Medicare Other | Source: Skilled Nursing Facility | Attending: Internal Medicine | Admitting: Internal Medicine

## 2015-09-29 DIAGNOSIS — E060 Acute thyroiditis: Secondary | ICD-10-CM | POA: Diagnosis not present

## 2015-09-29 LAB — CBC WITH DIFFERENTIAL/PLATELET
Basophils Absolute: 0 10*3/uL (ref 0.0–0.1)
Basophils Relative: 0 %
EOS PCT: 1 %
Eosinophils Absolute: 0.1 10*3/uL (ref 0.0–0.7)
HCT: 48.5 % — ABNORMAL HIGH (ref 36.0–46.0)
HEMOGLOBIN: 15.3 g/dL — AB (ref 12.0–15.0)
LYMPHS ABS: 0.9 10*3/uL (ref 0.7–4.0)
LYMPHS PCT: 8 %
MCH: 30.4 pg (ref 26.0–34.0)
MCHC: 31.5 g/dL (ref 30.0–36.0)
MCV: 96.4 fL (ref 78.0–100.0)
MONOS PCT: 8 %
Monocytes Absolute: 1 10*3/uL (ref 0.1–1.0)
NEUTROS PCT: 83 %
Neutro Abs: 9.5 10*3/uL — ABNORMAL HIGH (ref 1.7–7.7)
Platelets: 119 10*3/uL — ABNORMAL LOW (ref 150–400)
RBC: 5.03 MIL/uL (ref 3.87–5.11)
RDW: 14.9 % (ref 11.5–15.5)
WBC: 11.5 10*3/uL — AB (ref 4.0–10.5)

## 2015-09-29 LAB — COMPREHENSIVE METABOLIC PANEL
ALK PHOS: 69 U/L (ref 38–126)
ALT: 19 U/L (ref 14–54)
ANION GAP: 5 (ref 5–15)
AST: 23 U/L (ref 15–41)
Albumin: 3.7 g/dL (ref 3.5–5.0)
BILIRUBIN TOTAL: 0.6 mg/dL (ref 0.3–1.2)
BUN: 58 mg/dL — AB (ref 6–20)
CALCIUM: 8.9 mg/dL (ref 8.9–10.3)
CO2: 29 mmol/L (ref 22–32)
Chloride: 115 mmol/L — ABNORMAL HIGH (ref 101–111)
Creatinine, Ser: 1.07 mg/dL — ABNORMAL HIGH (ref 0.44–1.00)
GFR calc Af Amer: 53 mL/min — ABNORMAL LOW (ref >60)
GFR, EST NON AFRICAN AMERICAN: 45 mL/min — AB (ref >60)
GLUCOSE: 372 mg/dL — AB (ref 65–99)
POTASSIUM: 3.6 mmol/L (ref 3.5–5.1)
Sodium: 149 mmol/L — ABNORMAL HIGH (ref 135–145)
TOTAL PROTEIN: 8.1 g/dL (ref 6.5–8.1)

## 2015-09-30 ENCOUNTER — Other Ambulatory Visit (HOSPITAL_COMMUNITY)
Admission: RE | Admit: 2015-09-30 | Discharge: 2015-09-30 | Disposition: A | Discharge status: Home / Self Care | Payer: Medicare Other | Source: Skilled Nursing Facility | Attending: Internal Medicine | Admitting: Internal Medicine

## 2015-09-30 ENCOUNTER — Encounter: Payer: Self-pay | Admitting: Internal Medicine

## 2015-09-30 ENCOUNTER — Non-Acute Institutional Stay (SKILLED_NURSING_FACILITY): Payer: Medicare Other | Admitting: Internal Medicine

## 2015-09-30 DIAGNOSIS — R739 Hyperglycemia, unspecified: Secondary | ICD-10-CM

## 2015-09-30 DIAGNOSIS — Z9181 History of falling: Secondary | ICD-10-CM | POA: Insufficient documentation

## 2015-09-30 DIAGNOSIS — D696 Thrombocytopenia, unspecified: Secondary | ICD-10-CM | POA: Diagnosis not present

## 2015-09-30 DIAGNOSIS — D72829 Elevated white blood cell count, unspecified: Secondary | ICD-10-CM | POA: Diagnosis not present

## 2015-09-30 DIAGNOSIS — R5383 Other fatigue: Secondary | ICD-10-CM

## 2015-09-30 LAB — CBC WITH DIFFERENTIAL/PLATELET
Basophils Absolute: 0 10*3/uL (ref 0.0–0.1)
Basophils Relative: 0 %
Eosinophils Absolute: 0.2 10*3/uL (ref 0.0–0.7)
Eosinophils Relative: 2 %
HEMATOCRIT: 42.9 % (ref 36.0–46.0)
HEMOGLOBIN: 13.4 g/dL (ref 12.0–15.0)
LYMPHS ABS: 1 10*3/uL (ref 0.7–4.0)
LYMPHS PCT: 13 %
MCH: 29.9 pg (ref 26.0–34.0)
MCHC: 31.2 g/dL (ref 30.0–36.0)
MCV: 95.8 fL (ref 78.0–100.0)
MONOS PCT: 7 %
Monocytes Absolute: 0.5 10*3/uL (ref 0.1–1.0)
NEUTROS ABS: 6.3 10*3/uL (ref 1.7–7.7)
NEUTROS PCT: 79 %
Platelets: 95 10*3/uL — ABNORMAL LOW (ref 150–400)
RBC: 4.48 MIL/uL (ref 3.87–5.11)
RDW: 14.7 % (ref 11.5–15.5)
WBC: 8 10*3/uL (ref 4.0–10.5)

## 2015-09-30 LAB — URINALYSIS, ROUTINE W REFLEX MICROSCOPIC
BILIRUBIN URINE: NEGATIVE
Glucose, UA: 100 mg/dL — AB
Hgb urine dipstick: NEGATIVE
Ketones, ur: NEGATIVE mg/dL
Leukocytes, UA: NEGATIVE
NITRITE: NEGATIVE
PH: 6 (ref 5.0–8.0)
Protein, ur: NEGATIVE mg/dL

## 2015-09-30 LAB — BASIC METABOLIC PANEL
ANION GAP: 6 (ref 5–15)
BUN: 51 mg/dL — ABNORMAL HIGH (ref 6–20)
CHLORIDE: 115 mmol/L — AB (ref 101–111)
CO2: 26 mmol/L (ref 22–32)
CREATININE: 0.88 mg/dL (ref 0.44–1.00)
Calcium: 8.7 mg/dL — ABNORMAL LOW (ref 8.9–10.3)
GFR calc non Af Amer: 57 mL/min — ABNORMAL LOW (ref >60)
Glucose, Bld: 378 mg/dL — ABNORMAL HIGH (ref 65–99)
Potassium: 3.6 mmol/L (ref 3.5–5.1)
Sodium: 147 mmol/L — ABNORMAL HIGH (ref 135–145)

## 2015-09-30 NOTE — Progress Notes (Signed)
Location:   [facility] Room Number: 105/D Place of Service:  SNF (31) Provider:  Leeanne Deed, MD  Patient Care Team: Hendricks Limes, MD as PCP - General (Internal Medicine)  Extended Emergency Contact Information Primary Emergency Contact: Daughenbaugh,Raymond Address: [place], [place] [address] [place] Phone: [phone] Relation: Other  Code Status:  DNR Goals of care: Advanced Directive information Advanced Directives 09/30/2015  Does patient have an advance directive? Yes  Type of Advance Directive Out of facility DNR (pink MOST or yellow form)  Does patient want to make changes to advanced directive? No - Patient declined  Copy of advanced directive(s) in chart? Yes     Chief Complaint  Patient presents with  . Acute Visit    Not as alert  or aware    HPI:  Pt is a 82 y.o. female seen today for an acute visit for  Change in status-she is more lethargic lying in bed she usually is out ambulating in the hallway in her wheelchair.  She does have a history of severe progressive dementia.  Vital signs are stable was noted her blood sugars are elevated and Dr. Linna Darner did start her on Lantus 10 units daily.  Apparently she was somewhat more alert today than she was later yesterday but she is still lying in bed she is alert and responsive still somewhat combative but doesn't appear to be as energetic as she usually is.  Her vital signs are stable except her blood sugars are elevated actually at noon today it was slightly above 300.  Labs were done yesterday which did show a mildly elevated white count of 11.5 with absolute granulocytes sites of 9.5 platelets were lower than her normal at 119,000.  Metabolic panel did show a somewhat elevated sodium 149 her creatinine was 1.07 BUN of 58-fluids are being encouraged.  According in nursing she did eat her breakfast this morning.    History  reviewed. No pertinent past medical history. History reviewed. No pertinent surgical history.  Allergies  Allergen Reactions  . Penicillins     Current Outpatient Prescriptions on File Prior to Visit  Medication Sig Dispense Refill  . amLODipine (NORVASC) 10 MG tablet Take 10 mg by mouth daily. For HTN    . aspirin 81 MG tablet Take 81 mg by mouth daily.    . calcium carbonate (TUMS - DOSED IN MG ELEMENTAL CALCIUM) 500 MG chewable tablet Chew 1 tablet by mouth 2 (two) times daily.    . cholecalciferol (VITAMIN D) 1000 UNITS tablet Take 1,000 Units by mouth daily.    . cloNIDine (CATAPRES) 0.1 MG tablet Take 0.1 mg by mouth 2 (two) times daily.    Marland Kitchen docusate sodium (COLACE) 100 MG capsule Take 100 mg by mouth daily.    Marland Kitchen ENSURE (ENSURE) Take 237 mLs by mouth 3 (three) times daily between meals.    . levETIRAcetam (KEPPRA) 250 MG tablet Take 250 mg by mouth at bedtime. ( 2.5 ml ) oral    . levothyroxine (LEVOTHROID) 25 MCG tablet Take 1 tablet (25 mcg total) by mouth daily before breakfast.    . metoprolol tartrate (LOPRESSOR) 25 MG tablet Take 3 tabs-equal 75 mg by mouth twice a day    . mirtazapine (REMERON) 7.5 MG tablet Take 7.5 mg by mouth at bedtime.    . Multiple Vitamin (MULTIVITAMIN) tablet Take 1 tablet by  mouth daily.    . ranitidine (ZANTAC) 150 MG tablet Take 75 mg by mouth 2 (two) times daily.     No current facility-administered medications on file prior to visit.      Review of Systems  As noted in history of present illness essentially unattainable secondary to dementia-nursing staff has not noted any coughing or chest congestion  Immunization History  Administered Date(s) Administered  . Influenza-Unspecified 12/10/2013  . Pneumococcal-Unspecified 12/17/2005   Pertinent  Health Maintenance Due  Topic Date Due  . FOOT EXAM  08/22/2016 (Originally 11/04/1935)  . OPHTHALMOLOGY EXAM  08/22/2016 (Originally 11/04/1935)  . URINE MICROALBUMIN  08/22/2016 (Originally  11/04/1935)  . DEXA SCAN  08/22/2016 (Originally 11/04/1992)  . PNA vac Low Risk Adult (2 of 2 - PCV13) 08/22/2016 (Originally 12/18/2006)  . INFLUENZA VACCINE  10/04/2015  . HEMOGLOBIN A1C  01/15/2016   No flowsheet data found. Functional Status Survey:    Vitals:   09/30/15 1040  BP: 120/64  Pulse: 68  Resp: 18  SpO2: 92%  Weight: 85 lb 6.4 oz (38.7 kg)  Height: '5\' 2"'$  (1.575 m)   Body mass index is 15.62 kg/m. Physical Exam    In general this is a frail elderly female in no distress lying in bed she is alert and responsive but appears somewhat more lethargic and weaker than her baseline she is usually up ambulating in the wheelchair at this time of day  Her skin is warm and dry she does have bitemporal and limb wasting which is baseline.  Eyes pupils appear reactive to light   Chest is clear to auscultation with poor respiratory effort no labored breathing.  Heart  Is regular rate and rhythm without murmur gallop or rub she does not have significant lower extremity edema     Abdomen appears to be soft nontender with positive bowel sounds.  Muscle skeletal is able to move all extremities 4 at baseline she has again diffuse muscle wasting but strength appears to be relatively intact.  Neurologic is grossly intact I could not really appreciate any lateralizing findings.  Her speech is garbled which is her baseline Labs reviewed:  Recent Labs  06/15/15 0705 08/24/15 08/24/15 0715 09/29/15 1525  NA 144 141 141 149*  K 3.9  --  3.8 3.6  CL 108  --  107 115*  CO2 30  --  30 29  GLUCOSE 99  --  115* 372*  BUN 22* 33* 33* 58*  CREATININE 0.78 0.7 0.71 1.07*  CALCIUM 8.5*  --  8.5* 8.9    Recent Labs  05/02/15 0630 08/24/15 0715 09/29/15 1525  AST '18 16 23  '$ ALT '14 15 19  '$ ALKPHOS 48 56 69  BILITOT 0.4 0.6 0.6  PROT 6.4* 6.8 8.1  ALBUMIN 3.2* 3.2* 3.7    Recent Labs  07/15/15 0705 08/24/15 08/24/15 0715 09/29/15 1525  WBC 6.0 5.8 5.8  11.5*  NEUTROABS 4.0  --  3.7 9.5*  HGB 13.2  --  12.6 15.3*  HCT 40.3  --  38.1 48.5*  MCV 92.2  --  91.6 96.4  PLT 154  --  164 119*   Lab Results  Component Value Date   TSH 6.896 (H) 09/23/2015   Lab Results  Component Value Date   HGBA1C 6.8 (H) 07/15/2015   No results found for: CHOL, HDL, LDLCALC, LDLDIRECT, TRIG, CHOLHDL  Significant Diagnostic Results in last 30 days:  No results found.  Assessment/Plan #1-some increased lethargy with elevated blood  sugars and sodium.  Patient appears to have some element of renal insufficiency here-one would suspect possibly an infectious etiology with the mildly elevated white count-she does not appear to be in any acute distress-her vital signs remained stable except the sugars are elevated.  In regards to blood sugars Lantus has been initiated I did give her an additional 4 units of NovoLog for blood sugar of 330this noon-this will need regular monitoring. With CBGs before meals and daily at bedtime call provider if CBG less than 60 or greater than 300  I have also ordered stat CBC with differential and metabolic panel today to assess her white count as well as sodium level.  Also have obtained a urinalysis and culture.  Addendum-I did do serial checks during the day and patient appear to be somewhat more energetic somewhat more combative which is her baseline with exam appear to have a bit more energy and more alert.  Update labs done show improvement as well with a sodium of 147 potassium of 3.6 creatinine of 0.8 BUN of 51.  Urine culture is pending urinalysis does not look very remarkable however.  Her white count also has normalized at 8 hemoglobin is 13.4 this is down a bit from previous hemoglobin but I suspect there was some hemoconcentration here.  Platelets are 95,000.  At this point we will continue to monitor patient closely with vital signs pulse ox every shift-await urine culture results she does not show signs of  any respiratory compromise cough or congestion at this will have to be watched as well.  She appears clinically to be improving somewhat her labs appear to be improving although platelets continue to be somewhat low on this will have to be rechecked first laboratory day next week.  Clinically no signs of distress here but a fragile individual.  [id number]-of note greater than 45 minutes spent assessing patient and reassessing patient during the day-reviewing her labs including stat labs-discussing her status with nursing staff-and coordinating and formulating a plan of care-of note greater than 50% of time spent coordinating plan of care  .      Oralia Manis, [place]

## 2015-10-02 LAB — URINE CULTURE

## 2015-10-03 ENCOUNTER — Non-Acute Institutional Stay (SKILLED_NURSING_FACILITY): Payer: Medicare Other | Admitting: Internal Medicine

## 2015-10-03 ENCOUNTER — Encounter: Payer: Self-pay | Admitting: Internal Medicine

## 2015-10-03 ENCOUNTER — Other Ambulatory Visit (HOSPITAL_COMMUNITY)
Admission: RE | Admit: 2015-10-03 | Discharge: 2015-10-03 | Disposition: A | Discharge status: Home / Self Care | Payer: Medicare Other | Source: Skilled Nursing Facility | Attending: Internal Medicine | Admitting: Internal Medicine

## 2015-10-03 DIAGNOSIS — I10 Essential (primary) hypertension: Secondary | ICD-10-CM | POA: Insufficient documentation

## 2015-10-03 DIAGNOSIS — D696 Thrombocytopenia, unspecified: Secondary | ICD-10-CM

## 2015-10-03 DIAGNOSIS — R5383 Other fatigue: Secondary | ICD-10-CM | POA: Diagnosis not present

## 2015-10-03 DIAGNOSIS — R739 Hyperglycemia, unspecified: Secondary | ICD-10-CM | POA: Diagnosis not present

## 2015-10-03 LAB — CBC WITH DIFFERENTIAL/PLATELET
BASOS ABS: 0 10*3/uL (ref 0.0–0.1)
BASOS PCT: 0 %
EOS ABS: 0.1 10*3/uL (ref 0.0–0.7)
Eosinophils Relative: 2 %
HCT: 36.8 % (ref 36.0–46.0)
HEMOGLOBIN: 11.9 g/dL — AB (ref 12.0–15.0)
LYMPHS ABS: 1.2 10*3/uL (ref 0.7–4.0)
Lymphocytes Relative: 14 %
MCH: 30.2 pg (ref 26.0–34.0)
MCHC: 32.3 g/dL (ref 30.0–36.0)
MCV: 93.4 fL (ref 78.0–100.0)
Monocytes Absolute: 0.7 10*3/uL (ref 0.1–1.0)
Monocytes Relative: 8 %
NEUTROS PCT: 76 %
Neutro Abs: 6.7 10*3/uL (ref 1.7–7.7)
Platelets: 88 10*3/uL — ABNORMAL LOW (ref 150–400)
RBC: 3.94 MIL/uL (ref 3.87–5.11)
RDW: 13.9 % (ref 11.5–15.5)
WBC: 8.8 10*3/uL (ref 4.0–10.5)

## 2015-10-03 LAB — BASIC METABOLIC PANEL
Anion gap: 5 (ref 5–15)
BUN: 40 mg/dL — ABNORMAL HIGH (ref 6–20)
CALCIUM: 8.6 mg/dL — AB (ref 8.9–10.3)
CHLORIDE: 106 mmol/L (ref 101–111)
CO2: 31 mmol/L (ref 22–32)
CREATININE: 0.74 mg/dL (ref 0.44–1.00)
Glucose, Bld: 109 mg/dL — ABNORMAL HIGH (ref 65–99)
Potassium: 4.2 mmol/L (ref 3.5–5.1)
SODIUM: 142 mmol/L (ref 135–145)

## 2015-10-03 NOTE — Progress Notes (Signed)
Location:   [facility] Room Number: 105/D Place of Service:  SNF (31) [facility]:  Leeanne Deed, MD  Patient Care Team: Hendricks Limes, MD as PCP - General (Internal Medicine)  Extended Emergency Contact Information Primary Emergency Contact: Conard,Raymond Address: [place], [place] [address] [place] Phone: [phone] Relation: Other  Code Status:  DNR Goals of care: Advanced Directive information Advanced Directives 10/03/2015  Does patient have an advance directive? Yes  Type of Advance Directive Out of facility DNR (pink MOST or yellow form)  Does patient want to make changes to advanced directive? No - Patient declined  Copy of advanced directive(s) in chart? Yes     Chief Complaint  Patient presents with  . Acute Visit   follow-up  lethargy-elevated CBGs  HPI:  Pt is a 82 y.o. female seen today for an acute visit for  Follow-up lethargy and elevated CBGs.  Late last week patient had increased lethargy was not as energetic as she usually has-she usually is up ambulating about facility in her wheelchair was lying in bed although she was certainly responsive and vital signs are stable.  She also has a history of pre-diabetes but blood sugars were significantly elevated at times in the 300s Dr. Linna Darner did start her on Lantus 10 units daily at bedtime.  Blood sugars are somewhat variable actually was 54 this morning. His morning sugars have been in the 60s and 80s recently-later in a day there is more variability ranging from the 100s up to over 300 attimes    Initial labs last week showed a mildly elevated sodium this has normalized on most recent labs today with a sodium of 142-her white count has not been elevated it was 8.8 today.  I do note her platelets are low at 88,000 she has been running somewhat low here on the recent labs done late last week and this week.  There's been no  increased bruising or bleeding.  She has severe progressive dementia and cannot give any review of systems.   apparently was somewhat lethargic this weekend.  However later in the day patient was back up in her wheelchair and appeared to be ambulating the facility and actually at lunch she ate 75% of her meal   Family medical social history reviewed  Allergies  Allergen Reactions  . Penicillins     Current Outpatient Prescriptions on File Prior to Visit  Medication Sig Dispense Refill  . amLODipine (NORVASC) 10 MG tablet Take 10 mg by mouth daily. For HTN    . aspirin 81 MG tablet Take 81 mg by mouth daily.    . calcium carbonate (TUMS - DOSED IN MG ELEMENTAL CALCIUM) 500 MG chewable tablet Chew 1 tablet by mouth 2 (two) times daily. For osteoporosis    . cholecalciferol (VITAMIN D) 1000 UNITS tablet Take 1,000 Units by mouth daily. For osteoporosis    . cloNIDine (CATAPRES) 0.1 MG tablet Take 0.1 mg by mouth 2 (two) times daily. For Hypertension    . docusate sodium (COLACE) 100 MG capsule Take 100 mg by mouth daily.    Marland Kitchen ENSURE (ENSURE) Take 237 mLs by mouth 3 (three) times daily between meals.    . insulin glargine (LANTUS) 100 UNIT/ML injection Inject 10 Units into the skin at bedtime.    . levETIRAcetam (KEPPRA) 250 MG tablet Take 250 mg by mouth at bedtime. (  2.5 ml ) oral    . levothyroxine (LEVOTHROID) 25 MCG tablet Take 25 mcg by mouth daily before breakfast. For Hypothyroidism    . metoprolol tartrate (LOPRESSOR) 25 MG tablet Take 3 tabs-equal 75 mg by mouth twice a day    . mirtazapine (REMERON) 7.5 MG tablet Take 7.5 mg by mouth at bedtime.    . Multiple Vitamin (MULTIVITAMIN) tablet Take 1 tablet by mouth daily.    . ranitidine (ZANTAC) 150 MG tablet Take 75 mg by mouth 2 (two) times daily.     No current facility-administered medications on file prior to visit.      Review of Systems   Essentially unattainable secondary to dementia please see history of present  illness nursing staff has not noted any complaints of shortness of breath pain apparently she ate much better at noon today  Immunization History  Administered Date(s) Administered  . Influenza-Unspecified 12/10/2013  . Pneumococcal-Unspecified 12/17/2005   Pertinent  Health Maintenance Due  Topic Date Due  . FOOT EXAM  08/22/2016 (Originally 11/04/1935)  . OPHTHALMOLOGY EXAM  08/22/2016 (Originally 11/04/1935)  . URINE MICROALBUMIN  08/22/2016 (Originally 11/04/1935)  . DEXA SCAN  08/22/2016 (Originally 11/04/1992)  . PNA vac Low Risk Adult (2 of 2 - PCV13) 08/22/2016 (Originally 12/18/2006)  . INFLUENZA VACCINE  10/04/2015  . HEMOGLOBIN A1C  01/15/2016   No flowsheet data found. Functional Status Survey:    Vitals:   10/03/15 0956  BP: 110/60  Pulse: 68  Resp: 18  SpO2: 93%  Weight: 85 lb 6.4 oz (38.7 kg)  Height: '5\' 2"'$  (1.575 m)   Body mass index is 15.62 kg/m. Physical Exam    In general this is a frail elderly female in no distress lying in bed she is alert and responsive appears a bit more interactive than she did when I saw her last Friday-and as noted above later the day she was up ambulating in her wheelchair  Her skin is warm and dry she does have bitemporal and limb wasting which is baseline.  Eyes pupils appear reactive to light   Chest is clear to auscultation with poor respiratory effort no labored breathing.  Heart  Is regular rate and rhythm without murmur gallop or rub she does not have significant lower extremity edema     Abdomen appears to be soft nontender with positive bowel sounds.  Muscle skeletal is able to move all extremities 4 at baseline she has again diffuse muscle wasting but strength appears to be relatively intact.  Neurologic is grossly intact I could not really appreciate any lateralizing findings.  Her speech is garbled which is her baseline  Labs reviewed:  Recent Labs  09/29/15 1525 09/30/15 1157 10/03/15 0624    NA 149* 147* 142  K 3.6 3.6 4.2  CL 115* 115* 106  CO2 '29 26 31  '$ GLUCOSE 372* 378* 109*  BUN 58* 51* 40*  CREATININE 1.07* 0.88 0.74  CALCIUM 8.9 8.7* 8.6*    Recent Labs  05/02/15 0630 08/24/15 0715 09/29/15 1525  AST '18 16 23  '$ ALT '14 15 19  '$ ALKPHOS 48 56 69  BILITOT 0.4 0.6 0.6  PROT 6.4* 6.8 8.1  ALBUMIN 3.2* 3.2* 3.7    Recent Labs  09/29/15 1525 09/30/15 1157 10/03/15 0624  WBC 11.5* 8.0 8.8  NEUTROABS 9.5* 6.3 6.7  HGB 15.3* 13.4 11.9*  HCT 48.5* 42.9 36.8  MCV 96.4 95.8 93.4  PLT 119* 95* 88*   Lab Results  Component Value Date  TSH 6.896 (H) 09/23/2015   Lab Results  Component Value Date   HGBA1C 6.8 (H) 07/15/2015   No results found for: CHOL, HDL, LDLCALC, LDLDIRECT, TRIG, CHOLHDL  Significant Diagnostic Results in last 30 days:  No results found.  Assessment/Plan  #1 lethargy-again this appears to be  Improving  by later in the day she is back up ambulating wheelchair apparently ate better at noon-vital signs have been  stable at this point will monitor urine culture was done which does not show any specific pathogens showed multiple species non-predominant-she appears to be asymptomatic at this point but will monitor.  #2 diabetes type 2-she is on Lantus 10 units daily at bedtime-lower blood sugars especially in the morning is somewhat concerning all are she has quite a bit of variability at this point will discontinue her Lantus and monitor her blood sugars closely before meals and at bedtime notify Nikos Anglemyer of blood sugars below 60 or greater than 300- Encourage at bedtime snack  #3 thrombocytopenia-will need to be an eye on this will update lab work including a CBC and BMP on Thursday, August 3.  [id number]        [place], [place], [place]

## 2015-10-06 ENCOUNTER — Encounter (HOSPITAL_COMMUNITY)
Admission: RE | Admit: 2015-10-06 | Discharge: 2015-10-06 | Disposition: A | Discharge status: Home / Self Care | Payer: Medicare Other | Source: Skilled Nursing Facility | Attending: Internal Medicine | Admitting: Internal Medicine

## 2015-10-06 DIAGNOSIS — M810 Age-related osteoporosis without current pathological fracture: Secondary | ICD-10-CM | POA: Insufficient documentation

## 2015-10-06 DIAGNOSIS — M6281 Muscle weakness (generalized): Secondary | ICD-10-CM | POA: Insufficient documentation

## 2015-10-06 DIAGNOSIS — Z9181 History of falling: Secondary | ICD-10-CM | POA: Insufficient documentation

## 2015-10-06 DIAGNOSIS — R293 Abnormal posture: Secondary | ICD-10-CM | POA: Insufficient documentation

## 2015-10-06 DIAGNOSIS — I10 Essential (primary) hypertension: Secondary | ICD-10-CM | POA: Insufficient documentation

## 2015-10-06 LAB — CBC
HCT: 42.1 % (ref 36.0–46.0)
HEMOGLOBIN: 13.3 g/dL (ref 12.0–15.0)
MCH: 29.8 pg (ref 26.0–34.0)
MCHC: 31.6 g/dL (ref 30.0–36.0)
MCV: 94.2 fL (ref 78.0–100.0)
Platelets: 139 10*3/uL — ABNORMAL LOW (ref 150–400)
RBC: 4.47 MIL/uL (ref 3.87–5.11)
RDW: 14.3 % (ref 11.5–15.5)
WBC: 8.8 10*3/uL (ref 4.0–10.5)

## 2015-10-06 LAB — BASIC METABOLIC PANEL
Anion gap: 4 — ABNORMAL LOW (ref 5–15)
BUN: 36 mg/dL — AB (ref 6–20)
CHLORIDE: 107 mmol/L (ref 101–111)
CO2: 33 mmol/L — ABNORMAL HIGH (ref 22–32)
Calcium: 9 mg/dL (ref 8.9–10.3)
Creatinine, Ser: 0.74 mg/dL (ref 0.44–1.00)
GFR calc Af Amer: >60 mL/min (ref >60)
GFR calc non Af Amer: >60 mL/min (ref >60)
GLUCOSE: 295 mg/dL — AB (ref 65–99)
POTASSIUM: 3.8 mmol/L (ref 3.5–5.1)
Sodium: 144 mmol/L (ref 135–145)

## 2015-10-07 ENCOUNTER — Non-Acute Institutional Stay (SKILLED_NURSING_FACILITY): Payer: Medicare Other | Admitting: Internal Medicine

## 2015-10-07 ENCOUNTER — Encounter: Payer: Self-pay | Admitting: Internal Medicine

## 2015-10-07 DIAGNOSIS — E118 Type 2 diabetes mellitus with unspecified complications: Secondary | ICD-10-CM

## 2015-10-07 DIAGNOSIS — R5383 Other fatigue: Secondary | ICD-10-CM

## 2015-10-07 DIAGNOSIS — R1111 Vomiting without nausea: Secondary | ICD-10-CM | POA: Diagnosis not present

## 2015-10-07 NOTE — Progress Notes (Signed)
Location:   [facility] Room Number: 105/D Place of Service:  SNF (31) Provider:  Leeanne Deed, MD  Patient Care Team: Hendricks Limes, MD as PCP - General (Internal Medicine)  Extended Emergency Contact Information Primary Emergency Contact: Sarratt,Raymond Address: [address] [place], [place] [address] [place] Phone: [phone] Relation: Other  Code Status:  DNR Goals of care: Advanced Directive information Advanced Directives 10/07/2015  Does patient have an advance directive? Yes  Type of Advance Directive Out of facility DNR (pink MOST or yellow form)  Does patient want to make changes to advanced directive? No - Patient declined  Copy of advanced directive(s) in chart? Yes     Chief Complaint  Patient presents with  . Acute Visit    Follow-up DM    HPI:  Pt is a 82 y.o. female seen today for an acute visit for Follow-up of diabetes-patient [place] she was thought it appears to be a borderline diabetic And an oral agent at one point but this was DC'd secondary to stable blood sugars.  However recently she has had a spike in [place] blood sugars and she was started on Lantus 10 units daily at bedtime.  Sugars did moderate however she was found to be somewhat hypoglycemic at times and her Lantus was discontinued with orders to monitor-appears her sugars started rising again IC sugars largely in the 2 300 range for several days with morning sugars more in the high 100s to 200s.  She was restarted on Lantus 12 units yesterday-and blood sugar this morning was 151-she was aggressively fed however last night with an at bedtime snack secondary to concerns that she would go low again on the Lantus at 12 units.  So t 151 actually reflects fairly aggressive at bedtime snack suspect she would not be receiving on a consistent basis and this was done again aggressively secondary to concerns of hypoglycemia in the  morning.  Currently she is stable she did have an episode of vomiting shortly after lunch today but it was thought she possibly ate a bit too fast there's been no reoccurrence she appears to be stable at this point no sign of distress her blood sugar was 167  She did have increased lethargy several days ago but she is now back to her baseline ambulating in the hall somewhat combative but appears to be doing well with supportive care.     No past medical history on file. No past surgical history on file.  Allergies  Allergen Reactions  . Penicillins     Current Outpatient Prescriptions on File Prior to Visit  Medication Sig Dispense Refill  . amLODipine (NORVASC) 10 MG tablet Take 10 mg by mouth daily. For HTN    . aspirin 81 MG tablet Take 81 mg by mouth daily.    . calcium carbonate (TUMS - DOSED IN MG ELEMENTAL CALCIUM) 500 MG chewable tablet Chew 1 tablet by mouth 2 (two) times daily. For osteoporosis    . cholecalciferol (VITAMIN D) 1000 UNITS tablet Take 1,000 Units by mouth daily. For osteoporosis    . cloNIDine (CATAPRES) 0.1 MG tablet Take 0.1 mg by mouth 2 (two) times daily. For Hypertension    . docusate sodium (COLACE) 100 MG capsule Take 100 mg by mouth daily.    Marland Kitchen ENSURE (ENSURE) Take 237 mLs by mouth 3 (three) times daily between meals.    Marland Kitchen  insulin glargine (LANTUS) 100 UNIT/ML injection Inject 5 Units into the skin at bedtime.     . levETIRAcetam (KEPPRA) 250 MG tablet Take 250 mg by mouth at bedtime. ( 2.5 ml ) oral    . levothyroxine (LEVOTHROID) 25 MCG tablet Take 25 mcg by mouth daily before breakfast. For Hypothyroidism    . metoprolol tartrate (LOPRESSOR) 25 MG tablet Take 3 tabs-equal 75 mg by mouth twice a day    . mirtazapine (REMERON) 7.5 MG tablet Take 7.5 mg by mouth at bedtime.    . Multiple Vitamin (MULTIVITAMIN) tablet Take 1 tablet by mouth daily.    . ranitidine (ZANTAC) 150 MG tablet Take 75 mg by mouth 2 (two) times daily.     No current  facility-administered medications on file prior to visit.      Review of Systems   This is essentially unattainable secondary to dementia please see history of present illness  Immunization History  Administered Date(s) Administered  . Influenza-Unspecified 12/10/2013  . Pneumococcal-Unspecified 12/17/2005   Pertinent  Health Maintenance Due  Topic Date Due  . INFLUENZA VACCINE  12/04/2015 (Originally 10/04/2015)  . FOOT EXAM  08/22/2016 (Originally 11/04/1935)  . OPHTHALMOLOGY EXAM  08/22/2016 (Originally 11/04/1935)  . URINE MICROALBUMIN  08/22/2016 (Originally 11/04/1935)  . DEXA SCAN  08/22/2016 (Originally 11/04/1992)  . PNA vac Low Risk Adult (2 of 2 - PCV13) 08/22/2016 (Originally 12/18/2006)  . HEMOGLOBIN A1C  01/15/2016   No flowsheet data found. Functional Status Survey:    Vitals:   10/07/15 1431  BP: 110/70  Pulse: 76  Resp: 19  Temp: 98.6 F (37 C)  TempSrc: Oral  SpO2: 98%  Weight: 85 lb 6.4 oz (38.7 kg)  Height: '5\' 2"'$  (1.575 m)   Body mass index is 15.62 kg/m. Physical Exam     In general this is a frail elderly female in no distress sitting comfortably in her wheelchair she is somewhat agitated with attempts at physical exam which is not new  Her skin is warm and dry she does have bitemporal and limb wasting which is baseline.  Eyes pupils appear reactive to light   Oropharynx appears to be clear although very limited secondary patient not really cooperating with any verbal commands  Chest is clear to auscultation with poor respiratory effort no labored breathing.  Heart  Is regular rate and rhythm without murmur gallop or rub she does not have significant lower extremity edema     Abdomen appears to be soft nontender with positive bowel sounds.  Muscle skeletal is able to move all extremities 4 at baseline she has again diffuse muscle wasting but strength appears to be relatively intact.  Neurologic is grossly intact I could not  really appreciate any lateralizing findings.  Her speech is garbled which is her baseline  Labs reviewed:  Recent Labs  09/30/15 1157 10/03/15 0624 10/06/15 0651  NA 147* 142 144  K 3.6 4.2 3.8  CL 115* 106 107  CO2 26 31 33*  GLUCOSE 378* 109* 295*  BUN 51* 40* 36*  CREATININE 0.88 0.74 0.74  CALCIUM 8.7* 8.6* 9.0    Recent Labs  05/02/15 0630 08/24/15 0715 09/29/15 1525  AST '18 16 23  '$ ALT '14 15 19  '$ ALKPHOS 48 56 69  BILITOT 0.4 0.6 0.6  PROT 6.4* 6.8 8.1  ALBUMIN 3.2* 3.2* 3.7    Recent Labs  09/29/15 1525 09/30/15 1157 10/03/15 0624 10/06/15 0651  WBC 11.5* 8.0 8.8 8.8  NEUTROABS 9.5* 6.3  6.7  --   HGB 15.3* 13.4 11.9* 13.3  HCT 48.5* 42.9 36.8 42.1  MCV 96.4 95.8 93.4 94.2  PLT 119* 95* 88* 139*   Lab Results  Component Value Date   TSH 6.896 (H) 09/23/2015   Lab Results  Component Value Date   HGBA1C 6.8 (H) 07/15/2015   No results found for: CHOL, HDL, LDLCALC, LDLDIRECT, TRIG, CHOLHDL  Significant Diagnostic Results in last 30 days:  No results found.  Assessment/Plan Diabetes type 2-again she is back on Lantus but I suspect 12 units may lead to hypoglycemia since 10 units this was an issue as well-will reduce this to 5 units over this will better control of blood sugars shot today without having lower ones in the morning-this will have to be watched however will order CBGs before meals and at bedtime as well as 3 AM for now as well as encouraging an at bedtime snack-if sugars are consistent the elevated we can certainly titrate up the Lantus but would like to avoid hypoglycemia as the priority.  #2 vomiting this again appears to be an isolated episode she does not really show any signs of distress here I suspect she probably ate a bit too much too fast but this will have to be watched as well.  #3 lethargy this appears resolved she is back at her baseline labs and workup was essentially negative she appears again to be doing well with  supportive care.   [place], [place], [place]

## 2015-10-19 ENCOUNTER — Non-Acute Institutional Stay (SKILLED_NURSING_FACILITY): Payer: Medicare Other | Admitting: Internal Medicine

## 2015-10-19 DIAGNOSIS — R569 Unspecified convulsions: Secondary | ICD-10-CM | POA: Diagnosis not present

## 2015-10-19 DIAGNOSIS — D696 Thrombocytopenia, unspecified: Secondary | ICD-10-CM | POA: Diagnosis not present

## 2015-10-19 DIAGNOSIS — R7989 Other specified abnormal findings of blood chemistry: Secondary | ICD-10-CM

## 2015-10-19 DIAGNOSIS — R946 Abnormal results of thyroid function studies: Secondary | ICD-10-CM | POA: Diagnosis not present

## 2015-10-19 DIAGNOSIS — E118 Type 2 diabetes mellitus with unspecified complications: Secondary | ICD-10-CM

## 2015-10-19 DIAGNOSIS — F0391 Unspecified dementia with behavioral disturbance: Secondary | ICD-10-CM

## 2015-10-19 NOTE — Progress Notes (Signed)
This is a routine visit  Level care skilled   [place].   Chief complaint-medical management of chronic medical issues including diabetes type 2-hypothyroidism-dementia-anemia-hypertension-  HPI:   Patient is an 82 year old female with the above diagnoses-currently she appears to be back at her baseline-at one point several weeks oh had increased lethargy but she appears to have returned to her baseline in this respect.  We have been following her blood sugars she has a type II diabetic had been an oral agent at one time but blood sugars were stable without it.  However recently her sugars have started to rise she was put on Lantus 10 units at one point but this was reduced secondary to occasional low a.m. sugars-however now sugars appear to be rising again she is on Lantus 7 units.  Sugars in the morning appear somewhat variable ranging from 98 up to 386 which is her level today.  At noon sugars are variable but largely in the 2-300 range recently this appears to be case as well as for 30 and at 8 PM sugars are somewhat variable but again largely it appears more in the 2-300 range.  With occasional readings in the mid 100s.  She also has been started on Synthroid secondary to an elevated TSH she is currently on Synthroid 25 g a day this was started on July 21.  Her vital signs appear to be relatively stable at times she has an elevated systolic but this is thought probably secondary to agitation-recent blood pressures 147/96-135/84.  She is up sitting in her wheelchair appears to be at her baseline today although she is not ambulating about the facility is much as she used to.        .      Allergies  Allergen Reactions  . Penicillins           Current Outpatient Prescriptions on File Prior to Visit  Medication Sig Dispense Refill  . amLODipine (NORVASC) 10 MG tablet Take 10 mg by mouth daily. For HTN    . aspirin 81 MG tablet Take 81 mg by  mouth daily.    . calcium carbonate (TUMS - DOSED IN MG ELEMENTAL CALCIUM) 500 MG chewable tablet Chew 1 tablet by mouth 2 (two) times daily. For osteoporosis    . cholecalciferol (VITAMIN D) 1000 UNITS tablet Take 1,000 Units by mouth daily. For osteoporosis    . cloNIDine (CATAPRES) 0.1 MG tablet Take 0.1 mg by mouth 2 (two) times daily. For Hypertension    . docusate sodium (COLACE) 100 MG capsule Take 100 mg by mouth daily.    Marland Kitchen ENSURE (ENSURE) Take 237 mLs by mouth 3 (three) times daily between meals.    . insulin glargine (LANTUS) 100 UNIT/ML injection Inject 7Units into the skin at bedtime.     . levETIRAcetam (KEPPRA) 250 MG tablet Take 250 mg by mouth at bedtime. ( 2.5 ml ) oral    . levothyroxine (LEVOTHROID) 25 MCG tablet Take 25 mcg by mouth daily before breakfast. For Hypothyroidism    . metoprolol tartrate (LOPRESSOR) 25 MG tablet Take 3 tabs-equal 75 mg by mouth twice a day    . mirtazapine (REMERON) 7.5 MG tablet Take 7.5 mg by mouth at bedtime.    . Multiple Vitamin (MULTIVITAMIN) tablet Take 1 tablet by mouth daily.    . ranitidine (ZANTAC) 150 MG tablet Take 75 mg by mouth 2 (two) times daily.     No current facility-administered medications on  file prior to visit.      Review of Systems   This is essentially unattainable secondary to dementia please see history of present illness      Immunization History  Administered Date(s) Administered  . Influenza-Unspecified 12/10/2013  . Pneumococcal-Unspecified 12/17/2005       Pertinent  Health Maintenance Due  Topic Date Due  . INFLUENZA VACCINE  12/04/2015 (Originally 10/04/2015)  . FOOT EXAM  08/22/2016 (Originally 11/04/1935)  . OPHTHALMOLOGY EXAM  08/22/2016 (Originally 11/04/1935)  . URINE MICROALBUMIN  08/22/2016 (Originally 11/04/1935)  . DEXA SCAN  08/22/2016 (Originally 11/04/1992)  . PNA vac Low Risk Adult (2 of 2 - PCV13) 08/22/2016 (Originally 12/18/2006)  . HEMOGLOBIN A1C   01/15/2016   No flowsheet data found. Functional Status Survey:                                       Physical Exam   Temperature 97.0 pulse 79 respirations 18 blood pressure 147/96  In general this is a frail elderly female in no distress sitting comfortably in her wheelchair she is slightly agitated with attempts at physical exam which is not new  Her skin is warm and dry she does have bitemporal and limb wasting which is baseline.  Eyes pupils appear reactive to light   Oropharynx appears to be clear although very limited secondary patient not really cooperating with any verbal commands  Chest is clear to auscultation with poor respiratory effort no labored breathing.  Heart  Is regular rate and rhythm without murmur gallop or rub she does not have significant lower extremity edema     Abdomen appears to be soft nontender with positive bowel sounds.  Muscle skeletal is able to move all extremities 4 at baseline she has again diffuse muscle wasting but strength appears to be relatively intact.  Neurologic is grossly intact I could not really appreciate any lateralizing findings.--Continues with largely nonsensical speech  Her speech is garbled which is her baseline  Labs reviewed:  10/06/2015.  Sodium 144 potassium 3.8 BUN 36 creatinine 0.74.  WBC 8.8 hemoglobin 13.3 platelets 139  Recent Labs (within last 365 days)   Recent Labs  09/30/15 1157 10/03/15 0624 10/06/15 0651  NA 147* 142 144  K 3.6 4.2 3.8  CL 115* 106 107  CO2 26 31 33*  GLUCOSE 378* 109* 295*  BUN 51* 40* 36*  CREATININE 0.88 0.74 0.74  CALCIUM 8.7* 8.6* 9.0      Recent Labs (within last 365 days)   Recent Labs  05/02/15 0630 08/24/15 0715 09/29/15 1525  AST '18 16 23  '$ ALT '14 15 19  '$ ALKPHOS 48 56 69  BILITOT 0.4 0.6 0.6  PROT 6.4* 6.8 8.1  ALBUMIN 3.2* 3.2* 3.7      Recent Labs (within last 365 days)   Recent Labs  09/29/15 1525  09/30/15 1157 10/03/15 0624 10/06/15 0651  WBC 11.5* 8.0 8.8 8.8  NEUTROABS 9.5* 6.3 6.7  --   HGB 15.3* 13.4 11.9* 13.3  HCT 48.5* 42.9 36.8 42.1  MCV 96.4 95.8 93.4 94.2  PLT 119* 95* 88* 139*     Recent Labs       Lab Results  Component Value Date   TSH 6.896 (H) 09/23/2015     Recent Labs       Lab Results  Component Value Date   HGBA1C 6.8 (H) 07/15/2015  Recent Labs  No results found for: CHOL, HDL, LDLCALC, LDLDIRECT, TRIG, CHOLHDL    Significant Diagnostic Results in last 30 days:  Imaging Results  No results found.    Assessment/Plan Diabetes type 2- Somewhat challenging situation her blood sugars continue to be significantly elevated at times-this was discussed with Dr. Ezra Sites cautiously increase her Lantus back up to 10 units since on 7 units she is still running high fairly consistently-she will have to have a at bedtime snack  and this will have to be encouraged strongly.  Also for now we'll check her blood sugars at 3 AM to ensure stability. Also update hemoglobin A1c   # 2 hypothyroidism she has been started on Synthroid we will need an updated TSH which has been ordered-TSH was 6.89 on lab done 09/23/2015.   #3 hypertension at times has variable systolics but I do not see consistent elevations-at times blood pressures are difficult to obtain secondary to agitation at this point will monitor she does continue on Norvasc as well as clonidine and Lopressor. On clonidine 0.1 mg twice a day and Norvasc is 10 mg a day-Lopressor is 75 mg 3 times a day  #4 history seizure disorder this is been stable for a significant amount of time she is on Keppra.  #5-history poor appetite she is on Remeron 7.5 mg a day  continues with a spotty appetite.--Weight appears to be stable in the low 80s  #6 history of thrombocytopenia this appears to be improved on lab done on 10/06/2015 at 139,000 I do not see increased bruising or bleeding will monitor at  periodic intervals.  #7 history of abdominal aortic aneurysm this is been stable per CT scan done back in May 2013-ultrasound was ordered it was nondiagnostic because of bowel gas-a CT was suggested but this could not be performed secondary to patient agitation-at that time I did discuss this with her husband as well as with Dr. Adam Phenix was thought to be a very poor operative candidate-and has been was in agreement with conservative follow-up-at this point she does not appear to be symptomatic-  #8-history CHF she had been on Aldactone but this was discontinued because of renal issues-nonetheless she appears to be stable encompasses at this point.  #9 history coronary artery disease this is relatively asymptomatic without chest pain or discomfort she is on aspirin.  #10 history of anemia this was thought to be multi-factorial-this appears to be stable with recent hemoglobin of 13.3.  Osteoporosis she is on calcium with vitamin D.  #12 dementia-this appears to be relatively stable at baseline she does have agitation with invasive maneuvers but otherwise appears to be fairly stable in this regards-   [id number]-of note greater than 35 minutes spent assessing patient-reviewing her labs-reviewing her charts-discussing her status with nursing staff as well as with Dr. Corrinne Eagle coordinating and formulating a plan of care-of note greater than 50% of time spent coordinating plan of care

## 2015-10-20 ENCOUNTER — Encounter (HOSPITAL_COMMUNITY)
Admission: RE | Admit: 2015-10-20 | Discharge: 2015-10-20 | Disposition: A | Discharge status: Home / Self Care | Payer: Medicare Other | Source: Skilled Nursing Facility | Attending: *Deleted | Admitting: *Deleted

## 2015-10-20 DIAGNOSIS — I10 Essential (primary) hypertension: Secondary | ICD-10-CM | POA: Diagnosis not present

## 2015-10-20 LAB — TSH: TSH: 3.245 u[IU]/mL (ref 0.350–4.500)

## 2015-10-21 LAB — HEMOGLOBIN A1C
Hgb A1c MFr Bld: 8.9 % — ABNORMAL HIGH (ref 4.8–5.6)
Mean Plasma Glucose: 209 mg/dL

## 2015-10-26 ENCOUNTER — Encounter: Payer: Self-pay | Admitting: Internal Medicine

## 2015-10-26 ENCOUNTER — Non-Acute Institutional Stay (SKILLED_NURSING_FACILITY): Payer: Medicare Other | Admitting: Internal Medicine

## 2015-10-26 DIAGNOSIS — E118 Type 2 diabetes mellitus with unspecified complications: Secondary | ICD-10-CM | POA: Diagnosis not present

## 2015-10-26 NOTE — Progress Notes (Signed)
Location:   [facility] Room Number: 105/D Place of Service:  SNF (31) Provider:  Leeanne Deed, MD  Patient Care Team: Hendricks Limes, MD as PCP - General (Internal Medicine)  Extended Emergency Contact Information Primary Emergency Contact: Haughn,Raymond Address: [place], [place] [address] [place] Phone: [phone] Relation: Other  Code Status:  DNR Goals of care: Advanced Directive information Advanced Directives 10/26/2015  Does patient have an advance directive? Yes  Type of Advance Directive Out of facility DNR (pink MOST or yellow form)  Does patient want to make changes to advanced directive? No - Patient declined  Copy of advanced directive(s) in chart? Yes     Chief Complaint  Patient presents with  . Acute Visit    Blood Sugar  Elevated blood sugars follow-up  HPI:  Pt is a 82 y.o. female seen today for an acute visit for review of elevated blood sugars   She does have a history of type 2 diabetes which has been relatively well-controlled-I will plan. On an oral medication but this was discontinued secondary to stability of blood sugars.  However she has recently developed significant higher blood sugars and she has been started on Lantus originally 5 units and this was titrated up to 10 units however she had some low readings and it was titrated back down to 5 units.  She is having some elevated blood sugars more so later in the day ranging from the 200s to 400s range at times at times she will receive NovoLog insulin for coverage.  Clinically she appears to be at her baseline she has severe dementia but is doing well with supportive care.     History reviewed. No pertinent past medical history. History reviewed. No pertinent surgical history.  Allergies  Allergen Reactions  . Penicillins     Current Outpatient Prescriptions on File Prior to Visit  Medication Sig  Dispense Refill  . amLODipine (NORVASC) 10 MG tablet Take 10 mg by mouth daily. For HTN    . aspirin 81 MG tablet Take 81 mg by mouth daily.    . calcium carbonate (TUMS - DOSED IN MG ELEMENTAL CALCIUM) 500 MG chewable tablet Chew 1 tablet by mouth 2 (two) times daily. For osteoporosis    . cholecalciferol (VITAMIN D) 1000 UNITS tablet Take 1,000 Units by mouth daily. For osteoporosis    . cloNIDine (CATAPRES) 0.1 MG tablet Take 0.1 mg by mouth 2 (two) times daily. For Hypertension    . docusate sodium (COLACE) 100 MG capsule Take 100 mg by mouth daily.    . insulin glargine (LANTUS) 100 UNIT/ML injection Inject 5 Units into the skin at bedtime.     . levETIRAcetam (KEPPRA) 250 MG tablet Take 250 mg by mouth at bedtime. ( 2.5 ml ) oral    . levothyroxine (LEVOTHROID) 25 MCG tablet Take 25 mcg by mouth daily before breakfast. For Hypothyroidism    . metoprolol tartrate (LOPRESSOR) 25 MG tablet Take 3 tabs-equal 75 mg by mouth twice a day    . mirtazapine (REMERON) 7.5 MG tablet Take 7.5 mg by mouth at bedtime.    . Multiple Vitamin (MULTIVITAMIN) tablet Take 1 tablet by mouth daily.    . ranitidine (ZANTAC) 150 MG tablet Take 75 mg by mouth 2 (two) times daily.     No current facility-administered medications on file prior to visit.  Review of Systems   unattainable secondary to dementia please see history of present illness  No fever no chills chills  no change in behaviors beyond baseline  Immunization History  Administered Date(s) Administered  . Influenza-Unspecified 12/10/2013  . Pneumococcal-Unspecified 12/17/2005   Pertinent  Health Maintenance Due  Topic Date Due  . INFLUENZA VACCINE  12/04/2015 (Originally 10/04/2015)  . FOOT EXAM  08/22/2016 (Originally 11/04/1935)  . OPHTHALMOLOGY EXAM  08/22/2016 (Originally 11/04/1935)  . URINE MICROALBUMIN  08/22/2016 (Originally 11/04/1935)  . DEXA SCAN  08/22/2016 (Originally 11/04/1992)  . PNA vac Low Risk Adult (2 of 2 - PCV13)  08/22/2016 (Originally 12/18/2006)  . HEMOGLOBIN A1C  04/21/2016   No flowsheet data found. Functional Status Survey:    Vitals:   10/26/15 1617  BP: 102/62  Pulse: 67  Resp: 19  Temp: 98.1 F (36.7 C)  TempSrc: Oral  SpO2: 95%  Weight: 85 lb 3.2 oz (38.6 kg)  Height: '5\' 2"'$  (1.575 m)   Body mass index is 15.58 kg/m. Physical Exam   In general this a frail elderly female in no distress currently lying in bed.  Her skin is warm and dry.  Heart is regular rate and rhythm without murmur gallop or rub.  Abdomen appears to be soft and nontender she has some agitation but I suspect this is due to the invasive procedure.  Muscle skeletal does move all her extremities at baseline at baseline frailty and some muscle wasting.  Neurologic she is grossly intact she is alert and agitated with exam which is her baseline-findings compatible with severe dementia.   Labs reviewed:  Recent Labs  09/30/15 1157 10/03/15 0624 10/06/15 0651  NA 147* 142 144  K 3.6 4.2 3.8  CL 115* 106 107  CO2 26 31 33*  GLUCOSE 378* 109* 295*  BUN 51* 40* 36*  CREATININE 0.88 0.74 0.74  CALCIUM 8.7* 8.6* 9.0    Recent Labs  05/02/15 0630 08/24/15 0715 09/29/15 1525  AST '18 16 23  '$ ALT '14 15 19  '$ ALKPHOS 48 56 69  BILITOT 0.4 0.6 0.6  PROT 6.4* 6.8 8.1  ALBUMIN 3.2* 3.2* 3.7    Recent Labs  09/29/15 1525 09/30/15 1157 10/03/15 0624 10/06/15 0651  WBC 11.5* 8.0 8.8 8.8  NEUTROABS 9.5* 6.3 6.7  --   HGB 15.3* 13.4 11.9* 13.3  HCT 48.5* 42.9 36.8 42.1  MCV 96.4 95.8 93.4 94.2  PLT 119* 95* 88* 139*   Lab Results  Component Value Date   TSH 3.245 10/20/2015   Lab Results  Component Value Date   HGBA1C 8.9 (H) 10/20/2015   No results found for: CHOL, HDL, LDLCALC, LDLDIRECT, TRIG, CHOLHDL  Significant Diagnostic Results in last 30 days:  No results found.  Assessment/Plan  .IVs type II with some elevated blood sugars-she still continues per chart reviewed as some  variability-I did discuss this with her nurse who says she continues to have a spotty appetite-also speaking with nursing apparently she is receiving ensure frequently-although there are orders fo Glucerna.  Have clarified with nursing that she is to get Glucerna  and apparently this will be encouraged I suspect this will help her blood sugars-again with her variability would be hesitant  to be real aggressive with increasing the Lantus at this point.--I note this afternoon her blood sugar is [place], [place], [place]

## 2015-10-27 ENCOUNTER — Encounter: Payer: Self-pay | Admitting: Internal Medicine

## 2015-10-27 ENCOUNTER — Non-Acute Institutional Stay (SKILLED_NURSING_FACILITY): Payer: Medicare Other | Admitting: Internal Medicine

## 2015-10-27 DIAGNOSIS — E118 Type 2 diabetes mellitus with unspecified complications: Secondary | ICD-10-CM | POA: Diagnosis not present

## 2015-10-27 NOTE — Progress Notes (Signed)
Location:   [facility] Room Number: 105/D Place of Service:  SNF (31) Provider:  Leeanne Deed, MD  Patient Care Team: Hendricks Limes, MD as PCP - General (Internal Medicine)  Extended Emergency Contact Information Primary Emergency Contact: Lattanzio,Raymond Address: [place], [place] [address] [place] Phone: [phone] Relation: Other  Code Status:  DNR Goals of care: Advanced Directive information Advanced Directives 10/27/2015  Does patient have an advance directive? Yes  Type of Advance Directive Out of facility DNR (pink MOST or yellow form)  Does patient want to make changes to advanced directive? No - Patient declined  Copy of advanced directive(s) in chart? Yes     Chief Complaint  Patient presents with  . Acute Visit    B/S   Acute visit follow-up hypoglycemia HPI:  Pt is a 82 y.o. female seen today for an acute visit for a low blood sugar in the 50s this morning.  Apparently this came up fairly rapidly with supplementation. And I see listed blood sugars 153 this morning  Patient continues to have variable blood sugars in fact I saw her yesterday for elevated blood sugars however did not increase her dose of Lantus secondary to variability.  Currently she is up in the wheelchair is at her baseline   I do note her at bedtime blood sugar last night was 114     History reviewed. No pertinent past medical history. History reviewed. No pertinent surgical history.  Allergies  Allergen Reactions  . Penicillins     Current Outpatient Prescriptions on File Prior to Visit  Medication Sig Dispense Refill  . amLODipine (NORVASC) 10 MG tablet Take 10 mg by mouth daily. For HTN    . aspirin 81 MG tablet Take 81 mg by mouth daily.    . calcium carbonate (TUMS - DOSED IN MG ELEMENTAL CALCIUM) 500 MG chewable tablet Chew 1 tablet by mouth 2 (two) times daily. For osteoporosis    .  cholecalciferol (VITAMIN D) 1000 UNITS tablet Take 1,000 Units by mouth daily. For osteoporosis    . cloNIDine (CATAPRES) 0.1 MG tablet Take 0.1 mg by mouth 2 (two) times daily. For Hypertension    . docusate sodium (COLACE) 100 MG capsule Take 100 mg by mouth daily.    . feeding supplement, GLUCERNA SHAKE, (GLUCERNA SHAKE) LIQD Take 237 mLs by mouth 3 (three) times daily between meals.    . insulin glargine (LANTUS) 100 UNIT/ML injection Inject 5 Units into the skin at bedtime.     . levETIRAcetam (KEPPRA) 250 MG tablet Take 250 mg by mouth at bedtime. ( 2.5 ml ) oral    . levothyroxine (LEVOTHROID) 25 MCG tablet Take 25 mcg by mouth daily before breakfast. For Hypothyroidism    . metoprolol tartrate (LOPRESSOR) 25 MG tablet Take 3 tabs-equal 75 mg by mouth twice a day    . mirtazapine (REMERON) 7.5 MG tablet Take 7.5 mg by mouth at bedtime.    . Multiple Vitamin (MULTIVITAMIN) tablet Take 1 tablet by mouth daily.    . ranitidine (ZANTAC) 150 MG tablet Take 75 mg by mouth 2 (two) times daily.     No current facility-administered medications on file prior to visit.      Review of Systems   Unattainable secondary to dementia please see history of present illness  Immunization History  Administered Date(s) Administered  . Influenza-Unspecified 12/10/2013  .  Pneumococcal-Unspecified 12/17/2005   Pertinent  Health Maintenance Due  Topic Date Due  . INFLUENZA VACCINE  12/04/2015 (Originally 10/04/2015)  . FOOT EXAM  08/22/2016 (Originally 11/04/1935)  . OPHTHALMOLOGY EXAM  08/22/2016 (Originally 11/04/1935)  . URINE MICROALBUMIN  08/22/2016 (Originally 11/04/1935)  . DEXA SCAN  08/22/2016 (Originally 11/04/1992)  . PNA vac Low Risk Adult (2 of 2 - PCV13) 08/22/2016 (Originally 12/18/2006)  . HEMOGLOBIN A1C  04/21/2016   No flowsheet data found. Functional Status Survey:    Vitals:   10/27/15 1457  BP: (!) 167/79  Pulse: 75  Resp: 16  Temp: 97.4 F (36.3 C)  TempSrc: Oral  SpO2: 93%    Note blood pressures are variable I suspect systolic elevations are due to situational anxiety and agitation she is somewhat agitated today but this is not new There is no height or weight on file to calculate BMI. Physical Exam  in general this is a frail elderly female in no distress sitting comfortably in her wheelchair she is agitated with her exam  Her skin is warm and dry.  Heart difficult exam secondary to agitation radial pulse is regular.  Chest is clear to auscultation with poor respiratory effort no labored breathing.  Abdomen is soft does not appear to be tender listening to bowel sounds difficult secondary to agitation.  Muscle skeletal moves all extremities at baseline is ambulating in wheelchair today there is muscle wasting I do not note any acute deformities.  Neurologic she is at her baseline she is agitated does move all her extremities 4 cranial nerves appear to be intact continues with garbled speech which is her baseline.          Labs reviewed:  Recent Labs  09/30/15 1157 10/03/15 0624 10/06/15 0651  NA 147* 142 144  K 3.6 4.2 3.8  CL 115* 106 107  CO2 26 31 33*  GLUCOSE 378* 109* 295*  BUN 51* 40* 36*  CREATININE 0.88 0.74 0.74  CALCIUM 8.7* 8.6* 9.0    Recent Labs  05/02/15 0630 08/24/15 0715 09/29/15 1525  AST '18 16 23  '$ ALT '14 15 19  '$ ALKPHOS 48 56 69  BILITOT 0.4 0.6 0.6  PROT 6.4* 6.8 8.1  ALBUMIN 3.2* 3.2* 3.7    Recent Labs  09/29/15 1525 09/30/15 1157 10/03/15 0624 10/06/15 0651  WBC 11.5* 8.0 8.8 8.8  NEUTROABS 9.5* 6.3 6.7  --   HGB 15.3* 13.4 11.9* 13.3  HCT 48.5* 42.9 36.8 42.1  MCV 96.4 95.8 93.4 94.2  PLT 119* 95* 88* 139*   Lab Results  Component Value Date   TSH 3.245 10/20/2015   Lab Results  Component Value Date   HGBA1C 8.9 (H) 10/20/2015   No results found for: CHOL, HDL, LDLCALC, LDLDIRECT, TRIG, CHOLHDL  Significant Diagnostic Results in last 30 days:  No results  found.  Assessment/Plan  Diabetes type 2 with variable readings-low reading this morning.  This was discussed with Dr. Lyndel Safe who is in the facility as well-we will discontinue her Lantus to 5 units daily at bedtime and start Novolin 70-30 5 units every morning-I  will hopefully avoid low a.m. sugars since  peak should be well before the a.m. hours-this will have to be watched and titrated accordingly. She will also have to be encouraged to eat breakfast-      [address], [place], [place]

## 2015-11-18 ENCOUNTER — Encounter (HOSPITAL_COMMUNITY)
Admission: RE | Admit: 2015-11-18 | Discharge: 2015-11-18 | Disposition: A | Discharge status: Home / Self Care | Payer: Medicare Other | Source: Skilled Nursing Facility | Attending: Internal Medicine | Admitting: Internal Medicine

## 2015-11-18 DIAGNOSIS — M6281 Muscle weakness (generalized): Secondary | ICD-10-CM | POA: Diagnosis present

## 2015-11-18 DIAGNOSIS — I10 Essential (primary) hypertension: Secondary | ICD-10-CM | POA: Insufficient documentation

## 2015-11-18 DIAGNOSIS — Z9181 History of falling: Secondary | ICD-10-CM | POA: Diagnosis present

## 2015-11-18 DIAGNOSIS — R293 Abnormal posture: Secondary | ICD-10-CM | POA: Insufficient documentation

## 2015-11-18 DIAGNOSIS — E060 Acute thyroiditis: Secondary | ICD-10-CM | POA: Insufficient documentation

## 2015-11-18 DIAGNOSIS — M810 Age-related osteoporosis without current pathological fracture: Secondary | ICD-10-CM | POA: Diagnosis present

## 2015-11-18 LAB — TSH: TSH: 5.446 u[IU]/mL — ABNORMAL HIGH (ref 0.350–4.500)

## 2015-11-21 ENCOUNTER — Encounter: Payer: Self-pay | Admitting: Internal Medicine

## 2015-11-21 ENCOUNTER — Non-Acute Institutional Stay (SKILLED_NURSING_FACILITY): Payer: Medicare Other | Admitting: Internal Medicine

## 2015-11-21 DIAGNOSIS — R5383 Other fatigue: Secondary | ICD-10-CM

## 2015-11-21 DIAGNOSIS — E118 Type 2 diabetes mellitus with unspecified complications: Secondary | ICD-10-CM

## 2015-11-21 DIAGNOSIS — E039 Hypothyroidism, unspecified: Secondary | ICD-10-CM | POA: Insufficient documentation

## 2015-11-21 NOTE — Progress Notes (Signed)
This is an acute visit.  Level care skilled.  Facility [facility].  Chief complaint acute visit secondary to elevated TSH-also follow-up CBGs.  History of present illness.  Patient is a 82 year old female who is been a long-term resident of this facility-she has numerous medical issues most prominently severe dementia-nonetheless she appears to be doing well with supportive care.  She recently had a period where she was more lethargic and not eating and drinking as well --and we discontinued her Lantus and did start NovoLog 70/30 five units in the morning--her blood sugars appear to have stabilized she is now eating and drinking better his back ambulating about the facility in her wheelchair which is her baseline she appears to be essentially her previous self which is reassuring.  Her blood sugars are largely in the lower mid 100s it appears with rare spikes above 200.  She also has had variable TSHs in the past it was elevated at 6.896--2 months ago when she was started on Synthroid 25 g a day and this appeared to have normalized at 3.24  fourweeks ago however on lab done today TSH is rising again at 5.446.  Her vital signs appear to be stable although this is difficult to get on a consistent basis secondary to patient agitation-most recent blood pressures 141/73 and pulse of 65 which appears to be stable I suspect her systolic elevation at times is due to anxiety and agitation.  Previous medical history.  Diabetes type 2.  Hypothyroidism.  Severe dementia.  Anemia.  Hypertension.    Allergies  Allergen Reactions  . Penicillins           Current Outpatient Prescriptions on File Prior to Visit  Medication Sig Dispense Refill  . amLODipine (NORVASC) 10 MG tablet Take 10 mg by mouth daily. For HTN    . aspirin 81 MG tablet Take 81 mg by mouth daily.    . calcium carbonate (TUMS - DOSED IN MG ELEMENTAL CALCIUM) 500 MG chewable tablet Chew 1 tablet by mouth 2  (two) times daily. For osteoporosis    . cholecalciferol (VITAMIN D) 1000 UNITS tablet Take 1,000 Units by mouth daily. For osteoporosis    . cloNIDine (CATAPRES) 0.1 MG tablet Take 0.1 mg by mouth 2 (two) times daily. For Hypertension    . docusate sodium (COLACE) 100 MG capsule Take 100 mg by mouth daily.    . feeding supplement, GLUCERNA SHAKE, (GLUCERNA SHAKE) LIQD Take 237 mLs by mouth 3 (three) times daily between meals.    .  NovoLog 7030 insulin-5 units every morning      . levETIRAcetam (KEPPRA) 250 MG tablet Take 250 mg by mouth at bedtime. ( 2.5 ml ) oral    . levothyroxine (LEVOTHROID) 25 MCG tablet Take 25 mcg by mouth daily before breakfast. For Hypothyroidism    . metoprolol tartrate (LOPRESSOR) 25 MG tablet Take 3 tabs-equal 75 mg by mouth twice a day    . mirtazapine (REMERON) 7.5 MG tablet Take 7.5 mg by mouth at bedtime.    . Multiple Vitamin (MULTIVITAMIN) tablet Take 1 tablet by mouth daily.    . ranitidine (ZANTAC) 150 MG tablet Take 75 mg by mouth 2 (two) times daily.     No current facility-administered medications on file prior to visit.      Review of Systems   Unattainable secondary to dementia please see history of present illness      Immunization History  Administered Date(s) Administered  . Influenza-Unspecified 12/10/2013  .  Pneumococcal-Unspecified 12/17/2005       Pertinent  Health Maintenance Due  Topic Date Due  . INFLUENZA VACCINE  12/04/2015 (Originally 10/04/2015)  . FOOT EXAM  08/22/2016 (Originally 11/04/1935)  . OPHTHALMOLOGY EXAM  08/22/2016 (Originally 11/04/1935)  . URINE MICROALBUMIN  08/22/2016 (Originally 11/04/1935)  . DEXA SCAN  08/22/2016 (Originally 11/04/1992)  . PNA vac Low Risk Adult (2 of 2 - PCV13) 08/22/2016 (Originally 12/18/2006)  . HEMOGLOBIN A1C  04/21/2016   No flowsheet data found. Functional Status Survey:    Vital signs at times difficult to obtain but most recently afebrile blood  pressure 141/73 pulse 65 respirations of 18 . Physical Exam   Of note exam is limited secondary to patient agitation  in general this is a frail elderly female in no distress sitting comfortably in her wheelchair she is agitated with her exam  Her skin is warm and dry.  Heart difficult exam secondary to agitation radial pulse is regular.   Chest could not really assess secondary patient agitation   .  Muscle skeletal moves all extremities at baseline is ambulating in wheelchair today there is muscle wasting I do not note any acute deformities.  Neurologic she is at her baseline she is agitated does move all her extremities 4 cranial nerves appear to be intact continues with garbled speech which is her baseline.          Labs reviewed:  11/18/2015.  TSH-5.446.  4 weeks ago was 3.245--2 months ago was 6.896  Recent Labs (within last 365 days)   Recent Labs  09/30/15 1157 10/03/15 0624 10/06/15 0651  NA 147* 142 144  K 3.6 4.2 3.8  CL 115* 106 107  CO2 26 31 33*  GLUCOSE 378* 109* 295*  BUN 51* 40* 36*  CREATININE 0.88 0.74 0.74  CALCIUM 8.7* 8.6* 9.0      Recent Labs (within last 365 days)   Recent Labs  05/02/15 0630 08/24/15 0715 09/29/15 1525  AST '18 16 23  '$ ALT '14 15 19  '$ ALKPHOS 48 56 69  BILITOT 0.4 0.6 0.6  PROT 6.4* 6.8 8.1  ALBUMIN 3.2* 3.2* 3.7      Recent Labs (within last 365 days)   Recent Labs  09/29/15 1525 09/30/15 1157 10/03/15 0624 10/06/15 0651  WBC 11.5* 8.0 8.8 8.8  NEUTROABS 9.5* 6.3 6.7  --   HGB 15.3* 13.4 11.9* 13.3  HCT 48.5* 42.9 36.8 42.1  MCV 96.4 95.8 93.4 94.2  PLT 119* 95* 88* 139*     Recent Labs       Lab Results  Component Value Date   TSH 3.245 10/20/2015     Recent Labs       Lab Results  Component Value Date   HGBA1C 8.9 (H) 10/20/2015     Recent Labs  No results found for: CHOL, HDL, LDLCALC, LDLDIRECT, TRIG, CHOLHDL    Significant Diagnostic Results  in last 30 days:  Imaging Results  No results found.   Assessment and plan.  #1-hypothyroidism-TSH is rising again-will increase Synthroid to 50 g daily and recheck this in 4 weeks  #2 diabetes type 2-this appears to have stabilized as noted above with blood sugars largely in the low to mid 100s with occasional spikes a bit higher but certainly not consistent this is encouraging she is back to her baseline ambulating about the facility eating and drinking somewhat better-. She is now on NovoLog 70-30--5 units every morning  #3 lethargy as noted above this appears  essentially resolved in fact she had baseline agitation today with exam which limited the exam but this is not unusual.  [id number]

## 2015-11-29 ENCOUNTER — Encounter: Payer: Self-pay | Admitting: Internal Medicine

## 2015-11-29 DIAGNOSIS — E118 Type 2 diabetes mellitus with unspecified complications: Secondary | ICD-10-CM

## 2015-11-29 DIAGNOSIS — D696 Thrombocytopenia, unspecified: Secondary | ICD-10-CM

## 2015-11-29 DIAGNOSIS — R569 Unspecified convulsions: Secondary | ICD-10-CM

## 2015-11-29 DIAGNOSIS — E039 Hypothyroidism, unspecified: Secondary | ICD-10-CM

## 2015-11-29 NOTE — Progress Notes (Signed)
Location:   [facility] Room Number: 105/D Place of Service:  SNF (31) Provider:  Anjali,Gupta  No primary care provider on file.  Patient Care Team: Virgie Dad, MD as Consulting Physician (Geriatrics)  Extended Emergency Contact Information Primary Emergency Contact: Onstott,Raymond Address: [place], [place] [address] [place] Phone: [phone] Relation: Other  Code Status:  DNR Goals of care: Advanced Directive information Advanced Directives 11/29/2015  Does patient have an advance directive? Yes  Type of Advance Directive Out of facility DNR (pink MOST or yellow form)  Does patient want to make changes to advanced directive? No - Patient declined  Copy of advanced directive(s) in chart? Yes   Date is 02/03/2016  Chief Complaint  Patient presents with  . Medical Management of Chronic Issues    Routine Visit    HPI:  Pt is a 82 y.o. female seen today for medical management of chronic diseases. She had some unstable BS in last few weeks but since she has changed from Lantus to Novolog 70-30 her BS have been more stable. Her Synthroid is also recently adjusted. She is unable to give much history die to her dementia. But she has been stable and to her baseline.   History reviewed. No pertinent past medical history. History reviewed. No pertinent surgical history.  Allergies  Allergen Reactions  . Penicillins     Current Outpatient Prescriptions on File Prior to Visit  Medication Sig Dispense Refill  . amLODipine (NORVASC) 10 MG tablet Take 10 mg by mouth daily. For HTN    . aspirin 81 MG tablet Take 81 mg by mouth daily.    . calcium carbonate (TUMS - DOSED IN MG ELEMENTAL CALCIUM) 500 MG chewable tablet Chew 1 tablet by mouth 2 (two) times daily. For osteoporosis    . cholecalciferol (VITAMIN D) 1000 UNITS tablet Take 1,000 Units by mouth daily. For osteoporosis    . cloNIDine (CATAPRES) 0.1 MG  tablet Take 0.1 mg by mouth 2 (two) times daily. For Hypertension    . docusate sodium (COLACE) 100 MG capsule Take 100 mg by mouth daily.    . feeding supplement, GLUCERNA SHAKE, (GLUCERNA SHAKE) LIQD Take 237 mLs by mouth 3 (three) times daily between meals.    . levETIRAcetam (KEPPRA) 250 MG tablet Take 250 mg by mouth at bedtime. ( 2.5 ml ) oral    . levothyroxine (LEVOTHROID) 25 MCG tablet Take 25 mcg by mouth daily before breakfast. For Hypothyroidism    . metoprolol tartrate (LOPRESSOR) 25 MG tablet Take 3 tabs-equal 75 mg by mouth twice a day    . mirtazapine (REMERON) 7.5 MG tablet Take 7.5 mg by mouth at bedtime.    . Multiple Vitamin (MULTIVITAMIN) tablet Take 1 tablet by mouth daily.    . ranitidine (ZANTAC) 150 MG tablet Take 75 mg by mouth 2 (two) times daily.     No current facility-administered medications on file prior to visit.      Review of Systems  Unable to perform ROS: Dementia    Immunization History  Administered Date(s) Administered  . Influenza-Unspecified 12/10/2013  . Pneumococcal-Unspecified 12/17/2005   Pertinent  Health Maintenance Due  Topic Date Due  . INFLUENZA VACCINE  12/04/2015 (Originally 10/04/2015)  . FOOT EXAM  08/22/2016 (Originally 11/04/1935)  . OPHTHALMOLOGY EXAM  08/22/2016 (Originally 11/04/1935)  . URINE MICROALBUMIN  08/22/2016 (Originally 11/04/1935)  . DEXA SCAN  08/22/2016 (Originally 11/04/1992)  . PNA vac Low Risk Adult (2 of 2 - PCV13) 08/22/2016 (Originally 12/18/2006)  . HEMOGLOBIN A1C  04/21/2016   No flowsheet data found. Functional Status Survey:    Vitals:   11/29/15 1309  BP: (!) 141/63  Pulse: 65  Resp: 18  Temp: 97.2 F (36.2 C)  TempSrc: Oral  Weight: 87 lb 6.4 oz (39.6 kg)   Body mass index is 15.99 kg/m. Physical Exam  Constitutional: She appears well-nourished.  HENT:  Head: Normocephalic.  Cardiovascular: Normal rate, regular rhythm and normal heart sounds.   Pulmonary/Chest: Effort normal and breath  sounds normal. No respiratory distress. She has no wheezes. She has no rales. She exhibits no tenderness.  Abdominal: Soft. Bowel sounds are normal. She exhibits no distension. There is no tenderness. There is no rebound and no guarding.  Neurological: She is alert.  Not oriented. Does not follow commands.    Labs reviewed:  Recent Labs  09/30/15 1157 10/03/15 0624 10/06/15 0651  NA 147* 142 144  K 3.6 4.2 3.8  CL 115* 106 107  CO2 26 31 33*  GLUCOSE 378* 109* 295*  BUN 51* 40* 36*  CREATININE 0.88 0.74 0.74  CALCIUM 8.7* 8.6* 9.0    Recent Labs  05/02/15 0630 08/24/15 0715 09/29/15 1525  AST '18 16 23  '$ ALT '14 15 19  '$ ALKPHOS 48 56 69  BILITOT 0.4 0.6 0.6  PROT 6.4* 6.8 8.1  ALBUMIN 3.2* 3.2* 3.7    Recent Labs  09/29/15 1525 09/30/15 1157 10/03/15 0624 10/06/15 0651  WBC 11.5* 8.0 8.8 8.8  NEUTROABS 9.5* 6.3 6.7  --   HGB 15.3* 13.4 11.9* 13.3  HCT 48.5* 42.9 36.8 42.1  MCV 96.4 95.8 93.4 94.2  PLT 119* 95* 88* 139*   Lab Results  Component Value Date   TSH 5.446 (H) 11/18/2015   Lab Results  Component Value Date   HGBA1C 8.9 (H) 10/20/2015   No results found for: CHOL, HDL, LDLCALC, LDLDIRECT, TRIG, CHOLHDL  Significant Diagnostic Results in last 30 days:  No results found.  Assessment/Plan  Type 2 diabetes mellitus  Patients BS are now running mostly 110-170 She is on Novolog. Her Last HGA 1C was 8.9 in 08/17. Will continue to monitor BS . Patients BS tend to get low on high dose of insulin. So will be conservative with control.Check HGA1C in next visit.   Hypothyroidism Synthroid was recently increased. Follow up TSH in few weeks  Seizure Patient stable on Keppra  Thrombocytopenia  Hgb was normal.Platelets count was 139 and stable  CAD  Stable on Aspirin and Metoprolol  Dementia Continue supportive care.  H/O Abdominal Aneurysm It has decided to be conservative due to  severe Dementia with agitation   Family/ staff  Communication:   Labs/tests ordered:

## 2015-12-13 LAB — HM DIABETES EYE EXAM

## 2015-12-19 ENCOUNTER — Encounter (HOSPITAL_COMMUNITY)
Admission: RE | Admit: 2015-12-19 | Discharge: 2015-12-19 | Disposition: A | Discharge status: Home / Self Care | Payer: Medicare Other | Source: Skilled Nursing Facility | Attending: Internal Medicine | Admitting: Internal Medicine

## 2015-12-19 DIAGNOSIS — M810 Age-related osteoporosis without current pathological fracture: Secondary | ICD-10-CM | POA: Insufficient documentation

## 2015-12-19 DIAGNOSIS — R293 Abnormal posture: Secondary | ICD-10-CM | POA: Diagnosis present

## 2015-12-19 DIAGNOSIS — M6281 Muscle weakness (generalized): Secondary | ICD-10-CM | POA: Insufficient documentation

## 2015-12-19 DIAGNOSIS — Z9181 History of falling: Secondary | ICD-10-CM | POA: Diagnosis present

## 2015-12-19 DIAGNOSIS — I10 Essential (primary) hypertension: Secondary | ICD-10-CM | POA: Insufficient documentation

## 2015-12-19 LAB — TSH: TSH: 2.051 u[IU]/mL (ref 0.350–4.500)

## 2015-12-29 ENCOUNTER — Encounter: Payer: Self-pay | Admitting: Internal Medicine

## 2015-12-29 ENCOUNTER — Non-Acute Institutional Stay (SKILLED_NURSING_FACILITY): Payer: Medicare Other | Admitting: Internal Medicine

## 2015-12-29 DIAGNOSIS — G308 Other Alzheimer's disease: Secondary | ICD-10-CM

## 2015-12-29 DIAGNOSIS — I10 Essential (primary) hypertension: Secondary | ICD-10-CM

## 2015-12-29 DIAGNOSIS — E118 Type 2 diabetes mellitus with unspecified complications: Secondary | ICD-10-CM

## 2015-12-29 DIAGNOSIS — G309 Alzheimer's disease, unspecified: Principal | ICD-10-CM

## 2015-12-29 DIAGNOSIS — R569 Unspecified convulsions: Secondary | ICD-10-CM

## 2015-12-29 DIAGNOSIS — F0281 Dementia in other diseases classified elsewhere with behavioral disturbance: Secondary | ICD-10-CM

## 2015-12-29 DIAGNOSIS — M810 Age-related osteoporosis without current pathological fracture: Secondary | ICD-10-CM | POA: Diagnosis not present

## 2015-12-29 DIAGNOSIS — E039 Hypothyroidism, unspecified: Secondary | ICD-10-CM | POA: Diagnosis not present

## 2015-12-29 NOTE — Progress Notes (Signed)
Location:   [facility] Room Number: 105/D Place of Service:  SNF (31) Provider:  Saidi Santacroce,Alayshia Marini  No primary care provider on file.  Patient Care Team: Virgie Dad, MD as Consulting Physician [facility])  Extended Emergency Contact Information Primary Emergency Contact: Faughn,Raymond Address: [place], [place] [address] [place] Phone: [phone] Relation: Other  Code Status:  DNR Goals of care: Advanced Directive information Advanced Directives 12/29/2015  Does patient have an advance directive? Yes  Type of Advance Directive Out of facility DNR (pink MOST or yellow form)  Does patient want to make changes to advanced directive? No - Patient declined  Copy of advanced directive(s) in chart? Yes     Chief Complaint  Patient presents with  . Medical Management of Chronic Issues    Routine Visit   Medical management of dementia seizure disorder coronary artery disease-hypertension-hypothyroidism diabetes type 2 HPI:  Pt is a 82 y.o. female seen today for medical management of chronic diseases. As noted above.  She continues to be stable she did have a short period where she was not eating and drinking well but this appears to have resolved fairly unremarkably.  She is back at her baseline ambulating about facility in her wheelchair she does have severe dementia but appears to be doing well with supportive care her weight is stable in the mid 80s.  She does have a history of type 2 diabetes she is on Novolin 70/30 5 units and blood sugars appear to be stable--  hemoglobin A1c was up to-8.9 on lab done in August we will update this-she had been quite stable previously but it did spike up and thuswe have been more aggressive with her diabetic medications-although this is a balancing situation with her severe dementia and advanced age   Past Medical History:  Diagnosis Date  . CHF (congestive heart failure)  ([place])   . Diabetes mellitus without complication ([place])   . Hypertension   . Seizures ([place])   . Syncope   . Thyroid disease    Past Surgical History:  Procedure Laterality Date  . FRACTURE SURGERY    . Hip surgery for fracture      Allergies  Allergen Reactions  . Penicillins     Current Outpatient Prescriptions on File Prior to Visit  Medication Sig Dispense Refill  . amLODipine (NORVASC) 10 MG tablet Take 10 mg by mouth daily. For HTN    . aspirin 81 MG tablet Take 81 mg by mouth daily.    . calcium carbonate (TUMS - DOSED IN MG ELEMENTAL CALCIUM) 500 MG chewable tablet Chew 1 tablet by mouth 2 (two) times daily. For osteoporosis    . cholecalciferol (VITAMIN D) 1000 UNITS tablet Take 1,000 Units by mouth daily. For osteoporosis    . cloNIDine (CATAPRES) 0.1 MG tablet Take 0.1 mg by mouth 2 (two) times daily. For Hypertension    . docusate sodium (COLACE) 100 MG capsule Take 100 mg by mouth daily.    . feeding supplement, GLUCERNA SHAKE, (GLUCERNA SHAKE) LIQD Take 237 mLs by mouth 3 (three) times daily between meals.    . insulin aspart protamine- aspart (NOVOLOG MIX 70/30) (70-30) 100 UNIT/ML injection Give 5 units subcutaneous once a day.    . levETIRAcetam (KEPPRA) 250 MG tablet Take 250 mg by mouth at bedtime. ( 2.5 ml ) oral    . levothyroxine (LEVOTHROID) 25 MCG tablet Take  50 mcg by mouth daily before breakfast. For Hypothyroidism    . metoprolol tartrate (LOPRESSOR) 25 MG tablet Take 3 tabs-equal 75 mg by mouth twice a day    . mirtazapine (REMERON) 7.5 MG tablet Take 7.5 mg by mouth at bedtime.    . Multiple Vitamin (MULTIVITAMIN) tablet Take 1 tablet by mouth daily.    . ranitidine (ZANTAC) 150 MG tablet Take 75 mg by mouth 2 (two) times daily.     No current facility-administered medications on file prior to visit.      Review of Systems   Essentially unattainable secondary to dementia please see history of present illness  Immunization History  Administered  Date(s) Administered  . Influenza-Unspecified 12/10/2013, 12/03/2015  . Pneumococcal-Unspecified 12/17/2005, 12/13/2015   Pertinent  Health Maintenance Due  Topic Date Due  . FOOT EXAM  08/22/2016 (Originally 11/04/1935)  . URINE MICROALBUMIN  08/22/2016 (Originally 11/04/1935)  . DEXA SCAN  08/22/2016 (Originally 11/04/1992)  . HEMOGLOBIN A1C  04/21/2016  . OPHTHALMOLOGY EXAM  12/12/2016  . PNA vac Low Risk Adult (2 of 2 - PCV13) 12/12/2016  . INFLUENZA VACCINE  Completed   No flowsheet data found. Functional Status Survey:    Vitals:   12/18/15 1450  BP: 138/74  Pulse: 64  Resp: 18  Temp: 97.4 F (36.3 C)  TempSrc: Oral  SpO2: 97%  Weight at 85.2 is stable  Physical Exam in general this is a frail elderly female in no distress sitting comfortably in her wheelchair she is somewhat  agitated with her exam  Her skin is warm and dry.  Eyes-pupils appear reactive to light visual acuity appears grossly intact  Oropharynx is clear mucous membranes moist.    Heart regular rate and rhythm without murmur gallop or rub she does not have significant lower extremity edema   Chest is clear to auscultation with poor respiratory effort   .  Muscle skeletal moves all extremities at baseline is ambulating in wheelchair today there is muscle wasting I do not note any acute deformities.  Neurologic she is at her baseline she is agitated does move all her extremities 4 cranial nerves appear to be intact continues with garbled speech which is her baseline.      Labs reviewed:  Recent Labs  09/30/15 1157 10/03/15 0624 10/06/15 0651  NA 147* 142 144  K 3.6 4.2 3.8  CL 115* 106 107  CO2 26 31 33*  GLUCOSE 378* 109* 295*  BUN 51* 40* 36*  CREATININE 0.88 0.74 0.74  CALCIUM 8.7* 8.6* 9.0    Recent Labs  05/02/15 0630 08/24/15 0715 09/29/15 1525  AST '18 16 23  '$ ALT '14 15 19  '$ ALKPHOS 48 56 69  BILITOT 0.4 0.6 0.6  PROT 6.4* 6.8 8.1  ALBUMIN 3.2* 3.2* 3.7     Recent Labs  09/29/15 1525 09/30/15 1157 10/03/15 0624 10/06/15 0651  WBC 11.5* 8.0 8.8 8.8  NEUTROABS 9.5* 6.3 6.7  --   HGB 15.3* 13.4 11.9* 13.3  HCT 48.5* 42.9 36.8 42.1  MCV 96.4 95.8 93.4 94.2  PLT 119* 95* 88* 139*   Lab Results  Component Value Date   TSH 2.051 12/19/2015   Lab Results  Component Value Date   HGBA1C 8.9 (H) 10/20/2015   No results found for: CHOL, HDL, LDLCALC, LDLDIRECT, TRIG, CHOLHDL  Significant Diagnostic Results in last 30 days:  No results found.  Assessment/Plan 1 dementia-this appears to be stable with supportive care her weight is stable she is  on Remeron for appetite stimulation at this point will monitor continue supportive care.  #2 history of diabetes type 2-she is now on no: 70-30-blood sugars appear to be stable in the low 100s will recheck a hemoglobin A1c.  #3 history of hypothyroidism Synthroid was recently increased secondary to an elevated TSH of 5.446 on October 16 update TSH is pending.  #4-history seizure disorder this is been stable she is on Keppra no recent seizures to my knowledge.  #5 history of thrombocytopenia on lab done in August platelets were stable at 139,000 will update this.  #6 history coronary artery disease this has been stable on aspirin as well as Lopressor.  #7 history of abdominal aortic aneurysm-this has been assessed previously-patient had significant agitation with attempts to assess this-it was decided be conservative because of her severe dementia with agitation.  8-- history CHF this appears to be clinically stable with no evidence of increased edema or shortness of breath continues to and late about facility in her wheelchair without difficulty.  #9-history of hypertension this appears stable on Lopressor Norvasc and clonidine recent blood pressures 010/07-121/97--JOIT update a metabolic panel for updated values  #10-history of osteoporosis she is on calcium supplementation as well as  vitamin D.  #11 history of GERD-type symptoms she is on Zantac at this point will monitor.  Of note Will update a CBC to keep an eye on her thrombocytopenia as well as a metabolic panel with her history of hypertension-as well as at times it appears high-normal sodium-  [id number]-of note greater than 35 minutes spent assessing patient-discussing her status with nursing staff-reviewing her chart-her labs-and coordinating a care for numerous diagnoses-of note greater than 50% of time spent coordinating plan of care

## 2015-12-30 ENCOUNTER — Encounter (HOSPITAL_COMMUNITY)
Admission: RE | Admit: 2015-12-30 | Discharge: 2015-12-30 | Disposition: A | Discharge status: Home / Self Care | Payer: Medicare Other | Source: Skilled Nursing Facility | Attending: *Deleted | Admitting: *Deleted

## 2015-12-30 DIAGNOSIS — I10 Essential (primary) hypertension: Secondary | ICD-10-CM | POA: Diagnosis not present

## 2015-12-30 LAB — BASIC METABOLIC PANEL
BUN: 23 mg/dL — ABNORMAL HIGH (ref 6–20)
CHLORIDE: 110 mmol/L (ref 101–111)
CO2: 30 mmol/L (ref 22–32)
Calcium: 9.2 mg/dL (ref 8.9–10.3)
Creatinine, Ser: 0.78 mg/dL (ref 0.44–1.00)
GFR calc Af Amer: >60 mL/min (ref >60)
GFR calc non Af Amer: >60 mL/min (ref >60)
Glucose, Bld: 105 mg/dL — ABNORMAL HIGH (ref 65–99)
Potassium: 3.9 mmol/L (ref 3.5–5.1)
SODIUM: 142 mmol/L (ref 135–145)

## 2015-12-30 LAB — CBC
HCT: 41.5 % (ref 36.0–46.0)
Hemoglobin: 13.4 g/dL (ref 12.0–15.0)
MCH: 30 pg (ref 26.0–34.0)
MCHC: 32.3 g/dL (ref 30.0–36.0)
MCV: 92.8 fL (ref 78.0–100.0)
Platelets: 133 10*3/uL — ABNORMAL LOW (ref 150–400)
RBC: 4.47 MIL/uL (ref 3.87–5.11)
RDW: 14.5 % (ref 11.5–15.5)
WBC: 5.6 10*3/uL (ref 4.0–10.5)

## 2015-12-31 LAB — HEMOGLOBIN A1C
HEMOGLOBIN A1C: 6.6 % — AB (ref 4.8–5.6)
MEAN PLASMA GLUCOSE: 143 mg/dL

## 2016-01-24 ENCOUNTER — Non-Acute Institutional Stay (SKILLED_NURSING_FACILITY): Payer: Medicare Other | Admitting: Internal Medicine

## 2016-01-24 ENCOUNTER — Encounter (HOSPITAL_COMMUNITY)
Admission: AD | Admit: 2016-01-24 | Discharge: 2016-01-24 | Disposition: A | Discharge status: Home / Self Care | Payer: Medicare Other | Source: Skilled Nursing Facility | Attending: Internal Medicine | Admitting: Internal Medicine

## 2016-01-24 ENCOUNTER — Encounter: Payer: Self-pay | Admitting: Internal Medicine

## 2016-01-24 DIAGNOSIS — R569 Unspecified convulsions: Secondary | ICD-10-CM | POA: Diagnosis not present

## 2016-01-24 DIAGNOSIS — D696 Thrombocytopenia, unspecified: Secondary | ICD-10-CM | POA: Diagnosis not present

## 2016-01-24 DIAGNOSIS — E118 Type 2 diabetes mellitus with unspecified complications: Secondary | ICD-10-CM

## 2016-01-24 DIAGNOSIS — I714 Abdominal aortic aneurysm, without rupture, unspecified: Secondary | ICD-10-CM

## 2016-01-24 DIAGNOSIS — F0281 Dementia in other diseases classified elsewhere with behavioral disturbance: Secondary | ICD-10-CM

## 2016-01-24 DIAGNOSIS — I10 Essential (primary) hypertension: Secondary | ICD-10-CM | POA: Diagnosis not present

## 2016-01-24 DIAGNOSIS — E039 Hypothyroidism, unspecified: Secondary | ICD-10-CM | POA: Diagnosis not present

## 2016-01-24 DIAGNOSIS — G309 Alzheimer's disease, unspecified: Secondary | ICD-10-CM

## 2016-01-24 DIAGNOSIS — G308 Other Alzheimer's disease: Secondary | ICD-10-CM

## 2016-01-24 NOTE — Progress Notes (Signed)
Location:   [facility] Room Number: 105/D Place of Service:  SNF (31) Provider:  Clydene Fake, MD  Patient Care Team: Virgie Dad, MD as PCP - General (Internal Medicine)  Extended Emergency Contact Information Primary Emergency Contact: Evett,Raymond Address: [address], [place] [address] [place] Phone: [phone] Relation: Other  Code Status:  DNR Goals of care: Advanced Directive information Advanced Directives 01/24/2016  Does Patient Have a Medical Advance Directive? Yes  Type of Advance Directive Out of facility DNR (pink MOST or yellow form)  Does patient want to make changes to medical advance directive? -  Copy of [place] in Chart? -     Chief Complaint  Patient presents with  . Medical Management of Chronic Issues    Routine Visit    HPI:  Pt is a 82 y.o. female seen today for medical management of chronic diseases.  Patient has h/o Dementia with Behavior problems, Seizure disorder, Hypertension, Hypothyroidism, Diabetes Mellitus Type 2, and CAD  Patient has been stable in the facility. She hs not had any acute issues since past routine visit. He BS are running b/w 116-120 Her weight has actually improved to 86.8 lbs. Patient would not let me examine her properly and kept telling me to go away.   Past Medical History:  Diagnosis Date  . CHF (congestive heart failure) ([place])   . Diabetes mellitus without complication ([place])   . Hypertension   . Seizures ([place])   . Syncope   . Thyroid disease    Past Surgical History:  Procedure Laterality Date  . FRACTURE SURGERY    . Hip surgery for fracture      Allergies  Allergen Reactions  . Penicillins    Current Outpatient Prescriptions on File Prior to Visit  Medication Sig Dispense Refill  . amLODipine (NORVASC) 10 MG tablet Take 10 mg by mouth daily. For HTN    . aspirin 81 MG tablet Take 81 mg by  mouth daily.    . calcium carbonate (TUMS - DOSED IN MG ELEMENTAL CALCIUM) 500 MG chewable tablet Chew 1 tablet by mouth 2 (two) times daily. For osteoporosis    . cholecalciferol (VITAMIN D) 1000 UNITS tablet Take 1,000 Units by mouth daily. For osteoporosis    . cloNIDine (CATAPRES) 0.1 MG tablet Take 0.1 mg by mouth 2 (two) times daily. For Hypertension    . docusate sodium (COLACE) 100 MG capsule Take 100 mg by mouth daily.    . feeding supplement, GLUCERNA SHAKE, (GLUCERNA SHAKE) LIQD Take 237 mLs by mouth 3 (three) times daily between meals.    . levETIRAcetam (KEPPRA) 250 MG tablet Take 250 mg by mouth at bedtime. ( 2.5 ml ) oral    . levothyroxine (LEVOTHROID) 25 MCG tablet Take 50 mcg by mouth daily before breakfast. For Hypothyroidism    . metoprolol tartrate (LOPRESSOR) 25 MG tablet Take 3 tabs-equal 75 mg by mouth twice a day    . mirtazapine (REMERON) 7.5 MG tablet Take 7.5 mg by mouth at bedtime.    . Multiple Vitamin (MULTIVITAMIN) tablet Take 1 tablet by mouth daily.    . ranitidine (ZANTAC) 150 MG tablet Take 75 mg by mouth 2 (two) times daily.     No current facility-administered medications on file prior to visit.     Review of Systems  Unable to perform ROS: Dementia  Immunization History  Administered Date(s) Administered  . Influenza-Unspecified 12/10/2013, 12/03/2015  . Pneumococcal-Unspecified 12/17/2005, 12/13/2015   Pertinent  Health Maintenance Due  Topic Date Due  . FOOT EXAM  08/22/2016 (Originally 11/04/1935)  . URINE MICROALBUMIN  08/22/2016 (Originally 11/04/1935)  . DEXA SCAN  08/22/2016 (Originally 11/04/1992)  . HEMOGLOBIN A1C  06/29/2016  . OPHTHALMOLOGY EXAM  12/12/2016  . PNA vac Low Risk Adult (2 of 2 - PCV13) 12/12/2016  . INFLUENZA VACCINE  Completed   No flowsheet data found. Functional Status Survey:    Vitals:   01/24/16 1014  BP: (!) 154/60  Pulse: 64  Resp: 20  Temp: 97.8 F (36.6 C)  TempSrc: Oral   There is no height or  weight on file to calculate BMI. Physical Exam  Constitutional: She appears well-developed and well-nourished.  HENT:  Head: Normocephalic.  Cardiovascular: Normal rate, regular rhythm and normal heart sounds.   Pulmonary/Chest: Effort normal and breath sounds normal. No respiratory distress. She has no wheezes. She has no rales.  Abdominal: Soft. Bowel sounds are normal. She exhibits no distension. There is no tenderness. There is no rebound and no guarding.  Musculoskeletal: She exhibits no edema.  Neurological: She is alert.  Psychiatric: Her mood appears anxious. Her speech is rapid and/or pressured. She is aggressive.  Patient would not let me do detail exam on her as she wanted to be left alone.    Labs reviewed:  Recent Labs  10/03/15 0624 10/06/15 0651 12/30/15 0730  NA 142 144 142  K 4.2 3.8 3.9  CL 106 107 110  CO2 31 33* 30  GLUCOSE 109* 295* 105*  BUN 40* 36* 23*  CREATININE 0.74 0.74 0.78  CALCIUM 8.6* 9.0 9.2    Recent Labs  05/02/15 0630 08/24/15 0715 09/29/15 1525  AST '18 16 23  '$ ALT '14 15 19  '$ ALKPHOS 48 56 69  BILITOT 0.4 0.6 0.6  PROT 6.4* 6.8 8.1  ALBUMIN 3.2* 3.2* 3.7    Recent Labs  09/29/15 1525 09/30/15 1157 10/03/15 0624 10/06/15 0651 12/30/15 0730  WBC 11.5* 8.0 8.8 8.8 5.6  NEUTROABS 9.5* 6.3 6.7  --   --   HGB 15.3* 13.4 11.9* 13.3 13.4  HCT 48.5* 42.9 36.8 42.1 41.5  MCV 96.4 95.8 93.4 94.2 92.8  PLT 119* 95* 88* 139* 133*   Lab Results  Component Value Date   TSH 2.051 12/19/2015   Lab Results  Component Value Date   HGBA1C 6.6 (H) 12/30/2015   No results found for: CHOL, HDL, LDLCALC, LDLDIRECT, TRIG, CHOLHDL  Significant Diagnostic Results in last 30 days:  No results found.  Assessment/Plan  Essential hypertension Her BP is slightly elevated today  But usually her systolic runs less then 914. Continue on Norvasc , lopressor and clonidine.  Type 2 diabetes mellitus   A1c was 6.6 in 10/17 Check Urine for  Micro albumin as patient is not on ACE inhibitor.   Hypothyroidism TSH was normal in 10/17 Continue same dose of Synthroid.   Alzheimer's dementia with behavioral disturbance, Continue supportive care.  Abdominal aortic aneurysm (AAA) without rupture  No further Work up due to her age and dementia.  Thrombocytopenia Platelets stable. Continue to monitor.  Seizure Disorder Stable on Keppra.     Family/ staff Communication:   Labs/tests ordered:

## 2016-01-26 LAB — MICROALBUMIN, URINE: Microalb, Ur: 93.9 ug/mL — ABNORMAL HIGH

## 2016-02-03 ENCOUNTER — Non-Acute Institutional Stay (SKILLED_NURSING_FACILITY): Payer: Medicare Other | Admitting: Internal Medicine

## 2016-02-03 DIAGNOSIS — E118 Type 2 diabetes mellitus with unspecified complications: Secondary | ICD-10-CM | POA: Diagnosis not present

## 2016-02-04 ENCOUNTER — Non-Acute Institutional Stay (SKILLED_NURSING_FACILITY): Payer: Medicare Other | Admitting: Internal Medicine

## 2016-02-04 DIAGNOSIS — E118 Type 2 diabetes mellitus with unspecified complications: Secondary | ICD-10-CM

## 2016-02-04 NOTE — Progress Notes (Signed)
The date is 02/03/2016  This is an acute visit.  Level care skilled.  Facility is [facility].  Chief complaint-acute visit follow-up CBGs.  History of present was.  Patient is in an 82 year old female l with a history of diabetes type 2-at one point had not been on diabetic medication but hemoglobin A1c had risen at one point was 8.9---3 months ago-she is currently on NovoLog 7525-- 5 units every morning.  It appears her blood sugars however are running somewhat low this morning it was 98 and actually this afternoon at 4 PM was 62 per chart review it appears her morning sugars run from the 90s to low 100s.  At noon they run from 78 up to 264 although the norm needs appears to be more in the lower 100s.  At 4 PM blood sugars run from 80s to low 100s.  At at at bedtime run from the 90s to mid 100s generally.  She also has 3 AM blood sugars which run from the 80s to low 100s.  At times patient does have a spotty appetite with history of dementia-apparently she did eat a good dinner tonight however.  She is not in any distress continues to ambulate about facility in her wheelchair again ate good supper tonight      Past Medical History:  Diagnosis Date  . CHF (congestive heart failure) ([place])   . Diabetes mellitus without complication ([place])   . Hypertension   . Seizures ([place])   . Syncope   . Thyroid disease         Past Surgical History:  Procedure Laterality Date  . FRACTURE SURGERY    . Hip surgery for fracture          Allergies  Allergen Reactions  . Penicillins           Current Outpatient Prescriptions on File Prior to Visit  Medication Sig Dispense Refill  . amLODipine (NORVASC) 10 MG tablet Take 10 mg by mouth daily. For HTN    . aspirin 81 MG tablet Take 81 mg by mouth daily.    . calcium carbonate (TUMS - DOSED IN MG ELEMENTAL CALCIUM) 500 MG chewable tablet Chew 1 tablet by mouth 2 (two) times daily. For  osteoporosis    . cholecalciferol (VITAMIN D) 1000 UNITS tablet Take 1,000 Units by mouth daily. For osteoporosis    . cloNIDine (CATAPRES) 0.1 MG tablet Take 0.1 mg by mouth 2 (two) times daily. For Hypertension    . docusate sodium (COLACE) 100 MG capsule Take 100 mg by mouth daily.    . feeding supplement, GLUCERNA SHAKE, (GLUCERNA SHAKE) LIQD Take 237 mLs by mouth 3 (three) times daily between meals.    . insulin aspart protamine- aspart (NOVOLOG MIX 75/25)  100 UNIT/ML injection Give 5 units subcutaneous once a day.    . levETIRAcetam (KEPPRA) 250 MG tablet Take 250 mg by mouth at bedtime. ( 2.5 ml ) oral    . levothyroxine (LEVOTHROID) 25 MCG tablet Take 50 mcg by mouth daily before breakfast. For Hypothyroidism    . metoprolol tartrate (LOPRESSOR) 25 MG tablet Take 3 tabs-equal 75 mg by mouth twice a day    . mirtazapine (REMERON) 7.5 MG tablet Take 7.5 mg by mouth at bedtime.    . Multiple Vitamin (MULTIVITAMIN) tablet Take 1 tablet by mouth daily.    . ranitidine (ZANTAC) 150 MG tablet Take 75 mg by mouth 2 (two) times daily.     No current facility-administered  medications on file prior to visit.      Review of Systems  Essentially unattainable secondary to dementia please see history of present illness      Immunization History  Administered Date(s) Administered  . Influenza-Unspecified 12/10/2013, 12/03/2015  . Pneumococcal-Unspecified 12/17/2005, 12/13/2015       Pertinent Health Maintenance Due  Topic Date Due  . FOOT EXAM  08/22/2016 (Originally 11/04/1935)  . URINE MICROALBUMIN  08/22/2016 (Originally 11/04/1935)  . DEXA SCAN  08/22/2016 (Originally 11/04/1992)  . HEMOGLOBIN A1C  04/21/2016  . OPHTHALMOLOGY EXAM  12/12/2016  . PNA vac Low Risk Adult (2 of 2 - PCV13) 12/12/2016  . INFLUENZA VACCINE  Completed   No flowsheet data found. Functional Status Survey:   Is afebrile pulse is 64 respirations of 16  Physical  Exam in general this is a frail elderly female in no distress sitting comfortably in her wheelchair she is somewhat agitated with her exam which is normal for her  Her skin is warm and dry.  Eyes-pupils appear reactive to light visual acuity appears grossly intact  Oropharynx is clear mucous membranes moist.    Heart regular rate and rhythm without murmur gallop or rub she does not have significant lower extremity edema   Chest is clear to auscultation with poor respiratory effort   .  Muscle skeletal moves all extremities at baseline is ambulating in wheelchair today there is muscle wasting I do not note any acute deformities.  Neurologic she is at her baseline she is agitated does move all her extremities 4 cranial nerves appear to be intact continues with garbled speech which is her baseline.      Labs reviewed:  12/30/2015.  Hemoglobin A1c 6.6 this is improved from 8.93 months ago.  Sodium 142 potassium 3.9 BUN 23 creatinine 0.78.  WBC 5.6 hemoglobin 13.4 platelets 133  Recent Labs (within last 365 days)   Recent Labs  09/30/15 1157 10/03/15 0624 10/06/15 0651  NA 147* 142 144  K 3.6 4.2 3.8  CL 115* 106 107  CO2 26 31 33*  GLUCOSE 378* 109* 295*  BUN 51* 40* 36*  CREATININE 0.88 0.74 0.74  CALCIUM 8.7* 8.6* 9.0      Recent Labs (within last 365 days)   Recent Labs  05/02/15 0630 08/24/15 0715 09/29/15 1525  AST '18 16 23  '$ ALT '14 15 19  '$ ALKPHOS 48 56 69  BILITOT 0.4 0.6 0.6  PROT 6.4* 6.8 8.1  ALBUMIN 3.2* 3.2* 3.7      Recent Labs (within last 365 days)   Recent Labs  09/29/15 1525 09/30/15 1157 10/03/15 0624 10/06/15 0651  WBC 11.5* 8.0 8.8 8.8  NEUTROABS 9.5* 6.3 6.7 --   HGB 15.3* 13.4 11.9* 13.3  HCT 48.5* 42.9 36.8 42.1  MCV 96.4 95.8 93.4 94.2  PLT 119* 95* 88* 139*     Recent Labs       Lab Results  Component Value Date   TSH 2.051 12/19/2015     Recent Labs        Lab Results  Component Value Date   HGBA1C 8.9 (H) 10/20/2015     Recent Labs  No results found for: CHOL, HDL, LDLCALC, LDLDIRECT, TRIG, CHOLHDL    Significant Diagnostic Results in last 30 days:  Imaging Results    Assessment plan.  Diabetes type 2-her hemoglobin A1c has shown significant improvement 6.6 on lab done in late October-however concerned about hypoglycemia risk with some low blood sugars-at this point will  discontinue the NovoLog 7525 and monitor closely we may have to reinstitute another agent but at this point would like to avoid hypoglycemia if at all possible.-Patient does have significant dementia with spotty appetite at times    CPT [phone]

## 2016-02-04 NOTE — Progress Notes (Signed)
The date is 02/03/2016  This is an acute visit.  Level care skilled.  Facility is [facility].  Chief complaint-acute visit follow-up CBGs.  History of present was.  Patient is in an 82 year old female l with a history of diabetes type 2-at one point had not been on diabetic medication but hemoglobin A1c had risen at one point was 8.9---3 months ago-she is currently on NovoLog 7525-- 5 units every morning.  It appears her blood sugars however are running somewhat low this morning it was 98 and actually this afternoon at 4 PM was 62 per chart review it appears her morning sugars run from the 90s to low 100s.  At noon they run from 78 up to 264 although the norm needs appears to be more in the lower 100s.  At 4 PM blood sugars run from 80s to low 100s.  At at at bedtime run from the 90s to mid 100s generally.  She also has 3 AM blood sugars which run from the 80s to low 100s.  At times patient does have a spotty appetite with history of dementia-apparently she did eat a good dinner tonight however.  She is not in any distress continues to ambulate about facility in her wheelchair again ate good supper tonight  Past Medical History:  Diagnosis Date  . CHF (congestive heart failure) ([place])   . Diabetes mellitus without complication ([place])   . Hypertension   . Seizures ([place])   . Syncope   . Thyroid disease         Past Surgical History:  Procedure Laterality Date  . FRACTURE SURGERY    . Hip surgery for fracture          Allergies  Allergen Reactions  . Penicillins           Current Outpatient Prescriptions on File Prior to Visit  Medication Sig Dispense Refill  . amLODipine (NORVASC) 10 MG tablet Take 10 mg by mouth daily. For HTN    . aspirin 81 MG tablet Take 81 mg by mouth daily.    . calcium carbonate (TUMS - DOSED IN MG ELEMENTAL CALCIUM) 500 MG chewable tablet Chew 1 tablet by mouth 2 (two) times daily. For osteoporosis    .  cholecalciferol (VITAMIN D) 1000 UNITS tablet Take 1,000 Units by mouth daily. For osteoporosis    . cloNIDine (CATAPRES) 0.1 MG tablet Take 0.1 mg by mouth 2 (two) times daily. For Hypertension    . docusate sodium (COLACE) 100 MG capsule Take 100 mg by mouth daily.    . feeding supplement, GLUCERNA SHAKE, (GLUCERNA SHAKE) LIQD Take 237 mLs by mouth 3 (three) times daily between meals.    . insulin aspart protamine- aspart (NOVOLOG MIX 75/25)  100 UNIT/ML injection Give 5 units subcutaneous once a day.    . levETIRAcetam (KEPPRA) 250 MG tablet Take 250 mg by mouth at bedtime. ( 2.5 ml ) oral    . levothyroxine (LEVOTHROID) 25 MCG tablet Take 50 mcg by mouth daily before breakfast. For Hypothyroidism    . metoprolol tartrate (LOPRESSOR) 25 MG tablet Take 3 tabs-equal 75 mg by mouth twice a day    . mirtazapine (REMERON) 7.5 MG tablet Take 7.5 mg by mouth at bedtime.    . Multiple Vitamin (MULTIVITAMIN) tablet Take 1 tablet by mouth daily.    . ranitidine (ZANTAC) 150 MG tablet Take 75 mg by mouth 2 (two) times daily.     No current facility-administered medications on file  prior to visit.      Review of Systems   Essentially unattainable secondary to dementia please see history of present illness      Immunization History  Administered Date(s) Administered  . Influenza-Unspecified 12/10/2013, 12/03/2015  . Pneumococcal-Unspecified 12/17/2005, 12/13/2015       Pertinent  Health Maintenance Due  Topic Date Due  . FOOT EXAM  08/22/2016 (Originally 11/04/1935)  . URINE MICROALBUMIN  08/22/2016 (Originally 11/04/1935)  . DEXA SCAN  08/22/2016 (Originally 11/04/1992)  . HEMOGLOBIN A1C  04/21/2016  . OPHTHALMOLOGY EXAM  12/12/2016  . PNA vac Low Risk Adult (2 of 2 - PCV13) 12/12/2016  . INFLUENZA VACCINE  Completed   No flowsheet data found. Functional Status Survey:    Is afebrile pulse is 64 respirations of 16  Physical Exam in general this is a frail  elderly female in no distress sitting comfortably in her wheelchair she is somewhat  agitated with her exam which is normal for her  Her skin is warm and dry.  Eyes-pupils appear reactive to light visual acuity appears grossly intact  Oropharynx is clear mucous membranes moist.    Heart regular rate and rhythm without murmur gallop or rub she does not have significant lower extremity edema   Chest is clear to auscultation with poor respiratory effort   .  Muscle skeletal moves all extremities at baseline is ambulating in wheelchair today there is muscle wasting I do not note any acute deformities.  Neurologic she is at her baseline she is agitated does move all her extremities 4 cranial nerves appear to be intact continues with garbled speech which is her baseline.      Labs reviewed:  12/30/2015.  Hemoglobin A1c 6.6 this is improved from 8.93 months ago.  Sodium 142 potassium 3.9 BUN 23 creatinine 0.78.  WBC 5.6 hemoglobin 13.4 platelets 133  Recent Labs (within last 365 days)   Recent Labs  09/30/15 1157 10/03/15 0624 10/06/15 0651  NA 147* 142 144  K 3.6 4.2 3.8  CL 115* 106 107  CO2 26 31 33*  GLUCOSE 378* 109* 295*  BUN 51* 40* 36*  CREATININE 0.88 0.74 0.74  CALCIUM 8.7* 8.6* 9.0      Recent Labs (within last 365 days)   Recent Labs  05/02/15 0630 08/24/15 0715 09/29/15 1525  AST '18 16 23  '$ ALT '14 15 19  '$ ALKPHOS 48 56 69  BILITOT 0.4 0.6 0.6  PROT 6.4* 6.8 8.1  ALBUMIN 3.2* 3.2* 3.7      Recent Labs (within last 365 days)   Recent Labs  09/29/15 1525 09/30/15 1157 10/03/15 0624 10/06/15 0651  WBC 11.5* 8.0 8.8 8.8  NEUTROABS 9.5* 6.3 6.7  --   HGB 15.3* 13.4 11.9* 13.3  HCT 48.5* 42.9 36.8 42.1  MCV 96.4 95.8 93.4 94.2  PLT 119* 95* 88* 139*     Recent Labs       Lab Results  Component Value Date   TSH 2.051 12/19/2015     Recent Labs       Lab Results  Component Value Date   HGBA1C 8.9  (H) 10/20/2015     Recent Labs  No results found for: CHOL, HDL, LDLCALC, LDLDIRECT, TRIG, CHOLHDL    Significant Diagnostic Results in last 30 days:  Imaging Results    Assessment plan.  Diabetes type 2-her hemoglobin A1c has shown significant improvement 6.6 on lab done in late October-however concerned about hypoglycemia risk with some low blood sugars-at this  point will discontinue the NovoLog 7525 and monitor closely we may have to reinstitute another agent but at this point would like to avoid hypoglycemia if at all possible.-Patient does have significant dementia with spotty appetite at times    CPT [phone]

## 2016-02-17 ENCOUNTER — Non-Acute Institutional Stay (SKILLED_NURSING_FACILITY): Payer: Medicare Other | Admitting: Internal Medicine

## 2016-02-17 ENCOUNTER — Encounter: Payer: Self-pay | Admitting: Internal Medicine

## 2016-02-17 DIAGNOSIS — F0281 Dementia in other diseases classified elsewhere with behavioral disturbance: Secondary | ICD-10-CM

## 2016-02-17 DIAGNOSIS — G308 Other Alzheimer's disease: Secondary | ICD-10-CM | POA: Diagnosis not present

## 2016-02-17 DIAGNOSIS — I10 Essential (primary) hypertension: Secondary | ICD-10-CM

## 2016-02-17 DIAGNOSIS — E039 Hypothyroidism, unspecified: Secondary | ICD-10-CM | POA: Diagnosis not present

## 2016-02-17 DIAGNOSIS — D696 Thrombocytopenia, unspecified: Secondary | ICD-10-CM

## 2016-02-17 DIAGNOSIS — E118 Type 2 diabetes mellitus with unspecified complications: Secondary | ICD-10-CM | POA: Diagnosis not present

## 2016-02-17 DIAGNOSIS — R569 Unspecified convulsions: Secondary | ICD-10-CM

## 2016-02-17 DIAGNOSIS — G309 Alzheimer's disease, unspecified: Secondary | ICD-10-CM

## 2016-02-17 NOTE — Progress Notes (Signed)
Location:   [facility] Room Number: 105/D Place of Service:  SNF (31) Provider:  Freddi Starr, MD  Patient Care Team: Virgie Dad, MD as PCP - General (Internal Medicine)  Extended Emergency Contact Information Primary Emergency Contact: Abel,Raymond Address: [address], [place] [address] [place] Phone: [phone] Relation: Other  Code Status:  DNR Goals of care: Advanced Directive information Advanced Directives 02/17/2016  Does Patient Have a Medical Advance Directive? Yes  Type of Advance Directive Out of facility DNR (pink MOST or yellow form)  Does patient want to make changes to medical advance directive? No - Patient declined  Copy of [place] in Chart? -     Chief Complaint  Patient presents with  . Medical Management of Chronic Issues    Routine Visit  Medical management of chronic medical conditions including dementia hypertension-type 2 diabetes-hypothyroidism-thrombocytopenia seizure disorder  HPI:  Pt is a 82 y.o. female seen today for medical management of chronic diseases. She appears to be doing relatively well with supportive care she does have a history of significant dementia but her weight has been stable at around 87 pounds. She is on Remeron for appetite stimulation  She is a type II diabetic previously and been on Novolin 7525 but this was recently discontinued secondary to some low blood sugars-currently her sugars appear to be more in the mid 100s in the morning-somewhat more variability at noon ranging from 91 up 2-242 a sliding appears to be more in the mid 100 range.  At 4 PM blood sugars run from 85 up to 223 although again the normal range appears to be more in the mid 100s.  Mid 100s also appears to be somewhat the norm for her at bedtime readings.  At 3 AM her sugars also appear to be more in the lower to mid 100s.  Recent hemoglobin  A1c 12/30/2015 was 6.6.  Currently she is sitting in her wheelchair comfortably somewhat agitated with exam which is not new-I do note that time she has somewhat elevated systolics in the 629-528 range although this could be agitation related I got 150/60 manually-she is on Lopressor to 75 mg twice a day as well as Norvasc 10 mg a day and clonidine 0.1 mg twice a day.       Past Medical History:  Diagnosis Date  . CHF (congestive heart failure) ([place])   . Diabetes mellitus without complication ([place])   . Hypertension   . Seizures ([place])   . Syncope   . Thyroid disease    Past Surgical History:  Procedure Laterality Date  . FRACTURE SURGERY    . Hip surgery for fracture      Allergies  Allergen Reactions  . Penicillins     Current Outpatient Prescriptions on File Prior to Visit  Medication Sig Dispense Refill  . amLODipine (NORVASC) 10 MG tablet Take 10 mg by mouth daily. For HTN    . aspirin 81 MG tablet Take 81 mg by mouth daily.    . calcium carbonate (TUMS - DOSED IN MG ELEMENTAL CALCIUM) 500 MG chewable tablet Chew 1 tablet by mouth 2 (two) times daily. For osteoporosis    . cholecalciferol (VITAMIN D) 1000 UNITS tablet Take 1,000 Units by mouth daily. For osteoporosis    . cloNIDine (CATAPRES) 0.1 MG tablet Take 0.1 mg by mouth 2 (two) times daily. For  Hypertension    . docusate sodium (COLACE) 100 MG capsule Take 100 mg by mouth daily.    . feeding supplement, GLUCERNA SHAKE, (GLUCERNA SHAKE) LIQD Take 237 mLs by mouth 3 (three) times daily between meals.    . levETIRAcetam (KEPPRA) 250 MG tablet Take 250 mg by mouth at bedtime. ( 2.5 ml ) oral    . levothyroxine (LEVOTHROID) 25 MCG tablet Take 50 mcg by mouth daily before breakfast. For Hypothyroidism    . metoprolol tartrate (LOPRESSOR) 25 MG tablet Take 3 tabs-equal 75 mg by mouth twice a day    . mirtazapine (REMERON) 7.5 MG tablet Take 7.5 mg by mouth at bedtime.    . Multiple Vitamin (MULTIVITAMIN) tablet Take 1  tablet by mouth daily.    . ranitidine (ZANTAC) 150 MG tablet Take 75 mg by mouth 2 (two) times daily.     No current facility-administered medications on file prior to visit.      Review of Systems  Unable to obtain secondary to dementia please see history of present illness  Immunization History  Administered Date(s) Administered  . Influenza-Unspecified 12/10/2013, 12/03/2015  . Pneumococcal-Unspecified 12/17/2005, 12/13/2015   Pertinent  Health Maintenance Due  Topic Date Due  . FOOT EXAM  08/22/2016 (Originally 11/04/1935)  . DEXA SCAN  08/22/2016 (Originally 11[birth year]04/1990)  . HEMOGLOBIN A1C  06/29/2016  . OPHTHALMOLOGY EXAM  12/12/2016  . PNA vac Low Risk Adult (2 of 2 - PCV13) 12/12/2016  . URINE MICROALBUMIN  01/23/2017  . INFLUENZA VACCINE  Completed   No flowsheet data found. Functional Status Survey:    Vitals:   02/17/16 1440  BP: 135/63  Pulse: 68  Resp: 18  Temp: 98.6 F (37 C)  TempSrc: Oral  SpO2: 98%  manual bp 150/60 Weight is 87.2 pounds Physical Exam  Constitutional: This is a frail elderly female in no distress sitting comfortably in her wheelchair she is somewhat agitated with exam HENT:  Head: Normocephalic.  Cardiovascular: Normal rate, regular rhythm and normal heart sounds.   Pulmonary/Chest: Effort normal and breath sounds normal. No respiratory distress. She has no wheezes. She has no rales. Poor effort Abdominal: Soft. Bowel sounds are normal. She exhibits no distension. There is no tenderness. There is no rebound and no guarding.  Musculoskeletal: She exhibits no edema.  Neurological: She is alert. No lateralizing findings Psychiatric: Her mood appears anxious. Her speech is rapid and/or pressured. She is aggressive.       Labs reviewed:  Recent Labs  10/03/15 0624 10/06/15 0651 12/30/15 0730  NA 142 144 142  K 4.2 3.8 3.9  CL 106 107 110  CO2 31 33* 30  GLUCOSE 109* 295* 105*  BUN 40* 36* 23*  CREATININE 0.74 0.74 0.78    CALCIUM 8.6* 9.0 9.2    Recent Labs  05/02/15 0630 08/24/15 0715 09/29/15 1525  AST '18 16 23  '$ ALT '14 15 19  '$ ALKPHOS 48 56 69  BILITOT 0.4 0.6 0.6  PROT 6.4* 6.8 8.1  ALBUMIN 3.2* 3.2* 3.7    Recent Labs  09/29/15 1525 09/30/15 1157 10/03/15 0624 10/06/15 0651 12/30/15 0730  WBC 11.5* 8.0 8.8 8.8 5.6  NEUTROABS 9.5* 6.3 6.7  --   --   HGB 15.3* 13.4 11.9* 13.3 13.4  HCT 48.5* 42.9 36.8 42.1 41.5  MCV 96.4 95.8 93.4 94.2 92.8  PLT 119* 95* 88* 139* 133*   Lab Results  Component Value Date   TSH 2.051 12/19/2015   Lab Results  Component Value Date   HGBA1C 6.6 (H) 12/30/2015   No results found for: CHOL, HDL, LDLCALC, LDLDIRECT, TRIG, CHOLHDL  Significant Diagnostic Results in last 30 days:  No results found.  Assessment/Plan    Essential hypertension Her BP is slightly elevated today  But often her systolic runs less then 045. Continue on Norvasc , lopressor and clonidine. Monitor bp QD --log for review in about a week' \\check'$  BMP  Type 2 diabetes mellitus  variable CBG's no consistent elevations or low readings   Currently off insulin A1c was 6.6 in 10/17--update A1C  r.   Hypothyroidism TSH was normal in 10/17 Continue same dose of Synthroid. Update TSH   Alzheimer's dementia with behavioral disturbance, Continue supportive care.weight is stable  --on Remeron  Abdominal aortic aneurysm (AAA) without rupture  No further Work up due to her age and dementia.  Thrombocytopenia Platelets stable. Continue to monitor.update CBC  Seizure Disorder Stable on Keppra.no recent seizures  CPT [phone]

## 2016-02-20 ENCOUNTER — Encounter (HOSPITAL_COMMUNITY)
Admission: RE | Admit: 2016-02-20 | Discharge: 2016-02-20 | Disposition: A | Discharge status: Home / Self Care | Payer: Medicare Other | Source: Skilled Nursing Facility | Attending: Internal Medicine | Admitting: Internal Medicine

## 2016-02-20 DIAGNOSIS — E060 Acute thyroiditis: Secondary | ICD-10-CM | POA: Diagnosis present

## 2016-02-20 DIAGNOSIS — M810 Age-related osteoporosis without current pathological fracture: Secondary | ICD-10-CM | POA: Diagnosis present

## 2016-02-20 DIAGNOSIS — R569 Unspecified convulsions: Secondary | ICD-10-CM | POA: Diagnosis present

## 2016-02-20 DIAGNOSIS — Z9181 History of falling: Secondary | ICD-10-CM | POA: Insufficient documentation

## 2016-02-20 DIAGNOSIS — F419 Anxiety disorder, unspecified: Secondary | ICD-10-CM | POA: Diagnosis present

## 2016-02-20 DIAGNOSIS — I10 Essential (primary) hypertension: Secondary | ICD-10-CM | POA: Insufficient documentation

## 2016-02-20 LAB — CBC WITH DIFFERENTIAL/PLATELET
Basophils Absolute: 0 10*3/uL (ref 0.0–0.1)
Basophils Relative: 0 %
EOS ABS: 0.1 10*3/uL (ref 0.0–0.7)
EOS PCT: 1 %
HCT: 44.3 % (ref 36.0–46.0)
Hemoglobin: 14.5 g/dL (ref 12.0–15.0)
LYMPHS ABS: 1.1 10*3/uL (ref 0.7–4.0)
Lymphocytes Relative: 14 %
MCH: 30.4 pg (ref 26.0–34.0)
MCHC: 32.7 g/dL (ref 30.0–36.0)
MCV: 92.9 fL (ref 78.0–100.0)
MONO ABS: 0.8 10*3/uL (ref 0.1–1.0)
Monocytes Relative: 11 %
Neutro Abs: 5.7 10*3/uL (ref 1.7–7.7)
Neutrophils Relative %: 74 %
PLATELETS: 157 10*3/uL (ref 150–400)
RBC: 4.77 MIL/uL (ref 3.87–5.11)
RDW: 13.7 % (ref 11.5–15.5)
WBC: 7.7 10*3/uL (ref 4.0–10.5)

## 2016-02-20 LAB — BASIC METABOLIC PANEL
ANION GAP: 8 (ref 5–15)
BUN: 23 mg/dL — AB (ref 6–20)
CALCIUM: 9 mg/dL (ref 8.9–10.3)
CO2: 24 mmol/L (ref 22–32)
CREATININE: 0.74 mg/dL (ref 0.44–1.00)
Chloride: 108 mmol/L (ref 101–111)
GFR calc Af Amer: >60 mL/min (ref >60)
GLUCOSE: 131 mg/dL — AB (ref 65–99)
Potassium: 4.1 mmol/L (ref 3.5–5.1)
Sodium: 140 mmol/L (ref 135–145)

## 2016-02-20 LAB — TSH: TSH: 2.774 u[IU]/mL (ref 0.350–4.500)

## 2016-02-21 LAB — HEMOGLOBIN A1C
HEMOGLOBIN A1C: 6.4 % — AB (ref 4.8–5.6)
MEAN PLASMA GLUCOSE: 137 mg/dL

## 2016-03-15 ENCOUNTER — Non-Acute Institutional Stay (SKILLED_NURSING_FACILITY): Payer: Medicare Other | Admitting: Internal Medicine

## 2016-03-15 ENCOUNTER — Encounter: Payer: Self-pay | Admitting: Internal Medicine

## 2016-03-15 DIAGNOSIS — E118 Type 2 diabetes mellitus with unspecified complications: Secondary | ICD-10-CM | POA: Diagnosis not present

## 2016-03-15 DIAGNOSIS — E039 Hypothyroidism, unspecified: Secondary | ICD-10-CM | POA: Diagnosis not present

## 2016-03-15 DIAGNOSIS — I10 Essential (primary) hypertension: Secondary | ICD-10-CM

## 2016-03-15 DIAGNOSIS — G309 Alzheimer's disease, unspecified: Secondary | ICD-10-CM

## 2016-03-15 DIAGNOSIS — I25810 Atherosclerosis of coronary artery bypass graft(s) without angina pectoris: Secondary | ICD-10-CM | POA: Diagnosis not present

## 2016-03-15 DIAGNOSIS — F0281 Dementia in other diseases classified elsewhere with behavioral disturbance: Secondary | ICD-10-CM

## 2016-03-15 DIAGNOSIS — G308 Other Alzheimer's disease: Secondary | ICD-10-CM

## 2016-03-15 NOTE — Progress Notes (Signed)
Location:   [facility] Room Number: 105/D Place of Service:  SNF (31) Provider:  Clydene Fake, MD  Patient Care Team: Virgie Dad, MD as PCP - General (Internal Medicine)  Extended Emergency Contact Information Primary Emergency Contact: Witherow,Raymond Address: [address], [place] [address] [place] Phone: [phone] Relation: Other  Code Status:  DNR Goals of care: Advanced Directive information Advanced Directives 03/15/2016  Does Patient Have a Medical Advance Directive? Yes  Type of Advance Directive Out of facility DNR (pink MOST or yellow form)  Does patient want to make changes to medical advance directive? No - Patient declined  Copy of [place] in Chart? -     Chief Complaint  Patient presents with  . Medical Management of Chronic Issues    Routine Visit    HPI:  Pt is a 83 y.o. female seen today for medical management of chronic diseases.    Patient has h/o Dementia with Behavior problems, Seizure disorder, Hypertension, Hypothyroidism, Diabetes Mellitus Type 2, and CAD Since last routine visit patient has been stable. Her BS are running mostly less then 150  In the morning. Her last A1C was 6.4 in 12/17.  She gets around in her Wheel chair . Her weight is stable at 87 lbs.  Her Systolic  BP has been sometimes elevated more then 140. It seems that she does get little agitated when Nursing staff tries to take her BP No seizure Activity has noticed by the Staff.  Past Medical History:  Diagnosis Date  . CHF (congestive heart failure) ([place])   . Diabetes mellitus without complication ([facility])   . Hypertension   . Seizures ([place] [facility])   . Syncope   . Thyroid disease    Past Surgical History:  Procedure Laterality Date  . FRACTURE SURGERY    . Hip surgery for fracture      Allergies  Allergen Reactions  . Penicillins     Current Outpatient Prescriptions on  File Prior to Visit  Medication Sig Dispense Refill  . amLODipine (NORVASC) 10 MG tablet Take 10 mg by mouth daily. For HTN    . aspirin 81 MG tablet Take 81 mg by mouth daily.    . calcium carbonate (TUMS - DOSED IN MG ELEMENTAL CALCIUM) 500 MG chewable tablet Chew 1 tablet by mouth 2 (two) times daily. For osteoporosis    . cholecalciferol (VITAMIN D) 1000 UNITS tablet Take 1,000 Units by mouth daily. For osteoporosis    . cloNIDine (CATAPRES) 0.1 MG tablet Take 0.1 mg by mouth 2 (two) times daily. For Hypertension    . docusate sodium (COLACE) 100 MG capsule Take 100 mg by mouth daily.    . feeding supplement, GLUCERNA SHAKE, (GLUCERNA SHAKE) LIQD Take 237 mLs by mouth 3 (three) times daily between meals.    . levETIRAcetam (KEPPRA) 250 MG tablet Take 250 mg by mouth at bedtime. ( 2.5 ml ) oral    . levothyroxine (LEVOTHROID) 25 MCG tablet Take 50 mcg by mouth daily before breakfast. For Hypothyroidism    . metoprolol tartrate (LOPRESSOR) 25 MG tablet Take 3 tabs-equal 75 mg by mouth twice a day    . mirtazapine (REMERON) 7.5 MG tablet Take 7.5 mg by mouth at bedtime.    . Multiple Vitamin (MULTIVITAMIN) tablet Take 1 tablet by mouth daily.    . ranitidine (ZANTAC) 150 MG tablet  Take 75 mg by mouth 2 (two) times daily.     No current facility-administered medications on file prior to visit.      Review of Systems  Unable to perform ROS: Dementia    Immunization History  Administered Date(s) Administered  . Influenza-Unspecified 12/10/2013, 12/03/2015  . Pneumococcal-Unspecified 12/17/2005, 12/13/2015   Pertinent  Health Maintenance Due  Topic Date Due  . FOOT EXAM  08/22/2016 (Originally 11/04/1935)  . DEXA SCAN  08/22/2016 (Originally 11/04/1992)  . HEMOGLOBIN A1C  08/20/2016  . OPHTHALMOLOGY EXAM  12/12/2016  . PNA vac Low Risk Adult (2 of 2 - PCV13) 12/12/2016  . URINE MICROALBUMIN  01/23/2017  . INFLUENZA VACCINE  Completed   No flowsheet data found. Functional Status  Survey:    Vitals:   03/15/16 1327  BP: (!) 172/67 repeat by me manually. 155/70  Pulse: 78  Resp: 19  Temp: 97.4 F (36.3 C)  TempSrc: Axillary  SpO2: 94%   There is no height or weight on file to calculate BMI. Physical Exam  Constitutional: She appears well-developed and well-nourished.  HENT:  Head: Normocephalic.  Mouth/Throat: Oropharynx is clear and moist.  Eyes: Pupils are equal, round, and reactive to light.  Cardiovascular: Normal rate, regular rhythm and normal heart sounds.   No murmur heard. Pulmonary/Chest: Breath sounds normal. No respiratory distress. She has no wheezes. She has no rales.  Abdominal: Soft. Bowel sounds are normal. There is no tenderness. There is no rebound.  Musculoskeletal: She exhibits no edema.  Lymphadenopathy:    She has no cervical adenopathy.  Neurological: She is alert.  Moves all extremities well. Cannot do detail exam due to her dementia. She does not follow any commands.  Skin: Skin is warm and dry. No rash noted. No erythema.    Labs reviewed:  Recent Labs  10/06/15 0651 12/30/15 0730 02/20/16 0659  NA 144 142 140  K 3.8 3.9 4.1  CL 107 110 108  CO2 33* 30 24  GLUCOSE 295* 105* 131*  BUN 36* 23* 23*  CREATININE 0.74 0.78 0.74  CALCIUM 9.0 9.2 9.0    Recent Labs  05/02/15 0630 08/24/15 0715 09/29/15 1525  AST '18 16 23  '$ ALT '14 15 19  '$ ALKPHOS 48 56 69  BILITOT 0.4 0.6 0.6  PROT 6.4* 6.8 8.1  ALBUMIN 3.2* 3.2* 3.7    Recent Labs  09/30/15 1157 10/03/15 0624 10/06/15 0651 12/30/15 0730 02/20/16 0659  WBC 8.0 8.8 8.8 5.6 7.7  NEUTROABS 6.3 6.7  --   --  5.7  HGB 13.4 11.9* 13.3 13.4 14.5  HCT 42.9 36.8 42.1 41.5 44.3  MCV 95.8 93.4 94.2 92.8 92.9  PLT 95* 88* 139* 133* 157   Lab Results  Component Value Date   TSH 2.774 02/20/2016   Lab Results  Component Value Date   HGBA1C 6.4 (H) 02/20/2016   No results found for: CHOL, HDL, LDLCALC, LDLDIRECT, TRIG, CHOLHDL  Significant Diagnostic Results  in last 30 days:  No results found.  Assessment/Plan  Essential hypertension Patient BP has been elevated and she also has positive micro albumin. Will start her on Lisinopril 5 mg and repeat BMP in 2 weeks.  Type 2 diabetes mellitus  A1C well controlled. Micro albumin positive will start on Lisinopril   Alzheimer's dementia with behavioral disturbance,  Patient doing well with Supportive therapy.  Hypothyroidism TSH normal. Continue same dose of Synthroid  Osteoporosis Patient is on calcium and Vit D. She has Severe dementia and Do  not think she will cooperate with any kind of aggressive therapy for Osteoporosis.  Abdominal aortic aneurysm (AAA) without rupture  No further Work up due to her age and dementia.  Thrombocytopenia Platelets stable. Continue to monitor.  Seizure Disorder Stable on Keppra.   Family/ staff Communication:   Labs/tests ordered:

## 2016-03-22 ENCOUNTER — Encounter: Payer: Self-pay | Admitting: Internal Medicine

## 2016-03-29 ENCOUNTER — Encounter (HOSPITAL_COMMUNITY): Payer: Self-pay

## 2016-03-29 ENCOUNTER — Emergency Department (HOSPITAL_COMMUNITY): Payer: Medicare Other

## 2016-03-29 ENCOUNTER — Inpatient Hospital Stay (HOSPITAL_COMMUNITY)
Admission: EM | Admit: 2016-03-29 | Discharge: 2016-03-31 | DRG: 871 | Disposition: A | Discharge status: Skilled Nursing Facility | Payer: Medicare Other | Attending: Internal Medicine | Admitting: Internal Medicine

## 2016-03-29 DIAGNOSIS — Z66 Do not resuscitate: Secondary | ICD-10-CM | POA: Diagnosis present

## 2016-03-29 DIAGNOSIS — T68XXXA Hypothermia, initial encounter: Secondary | ICD-10-CM | POA: Diagnosis present

## 2016-03-29 DIAGNOSIS — A412 Sepsis due to unspecified staphylococcus: Secondary | ICD-10-CM | POA: Diagnosis not present

## 2016-03-29 DIAGNOSIS — I714 Abdominal aortic aneurysm, without rupture, unspecified: Secondary | ICD-10-CM | POA: Diagnosis present

## 2016-03-29 DIAGNOSIS — N179 Acute kidney failure, unspecified: Secondary | ICD-10-CM | POA: Diagnosis present

## 2016-03-29 DIAGNOSIS — E039 Hypothyroidism, unspecified: Secondary | ICD-10-CM | POA: Diagnosis present

## 2016-03-29 DIAGNOSIS — I25810 Atherosclerosis of coronary artery bypass graft(s) without angina pectoris: Secondary | ICD-10-CM | POA: Diagnosis present

## 2016-03-29 DIAGNOSIS — F0390 Unspecified dementia without behavioral disturbance: Secondary | ICD-10-CM | POA: Diagnosis present

## 2016-03-29 DIAGNOSIS — J189 Pneumonia, unspecified organism: Secondary | ICD-10-CM | POA: Diagnosis present

## 2016-03-29 DIAGNOSIS — I5033 Acute on chronic diastolic (congestive) heart failure: Secondary | ICD-10-CM | POA: Diagnosis present

## 2016-03-29 DIAGNOSIS — G309 Alzheimer's disease, unspecified: Secondary | ICD-10-CM | POA: Diagnosis present

## 2016-03-29 DIAGNOSIS — E876 Hypokalemia: Secondary | ICD-10-CM | POA: Diagnosis not present

## 2016-03-29 DIAGNOSIS — Z7401 Bed confinement status: Secondary | ICD-10-CM

## 2016-03-29 DIAGNOSIS — Z515 Encounter for palliative care: Secondary | ICD-10-CM

## 2016-03-29 DIAGNOSIS — K219 Gastro-esophageal reflux disease without esophagitis: Secondary | ICD-10-CM | POA: Diagnosis present

## 2016-03-29 DIAGNOSIS — M4854XA Collapsed vertebra, not elsewhere classified, thoracic region, initial encounter for fracture: Secondary | ICD-10-CM | POA: Diagnosis present

## 2016-03-29 DIAGNOSIS — E872 Acidosis, unspecified: Secondary | ICD-10-CM | POA: Diagnosis present

## 2016-03-29 DIAGNOSIS — Z79899 Other long term (current) drug therapy: Secondary | ICD-10-CM

## 2016-03-29 DIAGNOSIS — C3490 Malignant neoplasm of unspecified part of unspecified bronchus or lung: Secondary | ICD-10-CM | POA: Diagnosis present

## 2016-03-29 DIAGNOSIS — E1100 Type 2 diabetes mellitus with hyperosmolarity without nonketotic hyperglycemic-hyperosmolar coma (NKHHC): Secondary | ICD-10-CM | POA: Diagnosis present

## 2016-03-29 DIAGNOSIS — K5900 Constipation, unspecified: Secondary | ICD-10-CM | POA: Diagnosis present

## 2016-03-29 DIAGNOSIS — R6520 Severe sepsis without septic shock: Secondary | ICD-10-CM | POA: Diagnosis present

## 2016-03-29 DIAGNOSIS — E860 Dehydration: Secondary | ICD-10-CM | POA: Diagnosis present

## 2016-03-29 DIAGNOSIS — I248 Other forms of acute ischemic heart disease: Secondary | ICD-10-CM | POA: Diagnosis present

## 2016-03-29 DIAGNOSIS — I2510 Atherosclerotic heart disease of native coronary artery without angina pectoris: Secondary | ICD-10-CM | POA: Diagnosis present

## 2016-03-29 DIAGNOSIS — R739 Hyperglycemia, unspecified: Secondary | ICD-10-CM

## 2016-03-29 DIAGNOSIS — R778 Other specified abnormalities of plasma proteins: Secondary | ICD-10-CM | POA: Diagnosis present

## 2016-03-29 DIAGNOSIS — C3412 Malignant neoplasm of upper lobe, left bronchus or lung: Secondary | ICD-10-CM

## 2016-03-29 DIAGNOSIS — E118 Type 2 diabetes mellitus with unspecified complications: Secondary | ICD-10-CM | POA: Diagnosis present

## 2016-03-29 DIAGNOSIS — A419 Sepsis, unspecified organism: Secondary | ICD-10-CM | POA: Diagnosis present

## 2016-03-29 DIAGNOSIS — Z7982 Long term (current) use of aspirin: Secondary | ICD-10-CM

## 2016-03-29 DIAGNOSIS — I10 Essential (primary) hypertension: Secondary | ICD-10-CM | POA: Diagnosis present

## 2016-03-29 DIAGNOSIS — F0280 Dementia in other diseases classified elsewhere without behavioral disturbance: Secondary | ICD-10-CM | POA: Diagnosis present

## 2016-03-29 DIAGNOSIS — Z681 Body mass index (BMI) 19 or less, adult: Secondary | ICD-10-CM

## 2016-03-29 DIAGNOSIS — N170 Acute kidney failure with tubular necrosis: Secondary | ICD-10-CM | POA: Diagnosis present

## 2016-03-29 DIAGNOSIS — K5641 Fecal impaction: Secondary | ICD-10-CM | POA: Diagnosis present

## 2016-03-29 DIAGNOSIS — G9341 Metabolic encephalopathy: Secondary | ICD-10-CM | POA: Diagnosis present

## 2016-03-29 DIAGNOSIS — G40909 Epilepsy, unspecified, not intractable, without status epilepticus: Secondary | ICD-10-CM | POA: Diagnosis present

## 2016-03-29 DIAGNOSIS — R64 Cachexia: Secondary | ICD-10-CM | POA: Diagnosis present

## 2016-03-29 DIAGNOSIS — R0602 Shortness of breath: Secondary | ICD-10-CM

## 2016-03-29 DIAGNOSIS — B958 Unspecified staphylococcus as the cause of diseases classified elsewhere: Secondary | ICD-10-CM | POA: Diagnosis present

## 2016-03-29 DIAGNOSIS — R7881 Bacteremia: Secondary | ICD-10-CM | POA: Diagnosis present

## 2016-03-29 DIAGNOSIS — R569 Unspecified convulsions: Secondary | ICD-10-CM

## 2016-03-29 DIAGNOSIS — R7989 Other specified abnormal findings of blood chemistry: Secondary | ICD-10-CM | POA: Diagnosis present

## 2016-03-29 DIAGNOSIS — I110 Hypertensive heart disease with heart failure: Secondary | ICD-10-CM | POA: Diagnosis present

## 2016-03-29 DIAGNOSIS — E875 Hyperkalemia: Secondary | ICD-10-CM | POA: Diagnosis present

## 2016-03-29 DIAGNOSIS — R4182 Altered mental status, unspecified: Secondary | ICD-10-CM | POA: Diagnosis not present

## 2016-03-29 DIAGNOSIS — E870 Hyperosmolality and hypernatremia: Secondary | ICD-10-CM | POA: Diagnosis present

## 2016-03-29 LAB — CBG MONITORING, ED

## 2016-03-29 MED ORDER — DEXTROSE-NACL 5-0.45 % IV SOLN: INTRAVENOUS | Status: DC

## 2016-03-29 MED ORDER — SODIUM CHLORIDE 0.9 % IV SOLN
INTRAVENOUS | Status: DC
  Administered 2016-03-30: 01:00:00 via INTRAVENOUS
  Administered 2016-03-30: 125 mL/h via INTRAVENOUS

## 2016-03-29 MED ORDER — SODIUM CHLORIDE 0.9 % IV SOLN
INTRAVENOUS | Status: DC
  Administered 2016-03-30: 5.4 [IU]/h via INTRAVENOUS
  Filled 2016-03-29: qty 2.5

## 2016-03-29 MED ORDER — SODIUM CHLORIDE 0.9 % IV BOLUS (SEPSIS)
1000.0000 mL | Freq: Once | INTRAVENOUS | Status: AC
  Administered 2016-03-30: 1000 mL via INTRAVENOUS

## 2016-03-29 NOTE — ED Notes (Signed)
Per nursing home staff, pt was taken off of her insulin coverage approx 1 month ago due to decreased need. Pt was to have blood sugars closely monitored due to same and tonight due to patient being restless and more confused, her blood sugar was checked and found to be greater than 600 x 2.

## 2016-03-29 NOTE — ED Provider Notes (Addendum)
[facility] DEPT Provider Note   CSN: [id number] Arrival date & time: 03/29/16  2225   By signing my name below, I, Hilbert Odor, attest that this documentation has been prepared under the direction and in the presence of Varney Biles, MD. Electronically Signed: Hilbert Odor, Scribe. 03/29/16. 11:34 PM. History   Chief Complaint Chief Complaint  Patient presents with  . Hyperglycemia    LEVEL 5 CAVEAT: confusion, altered mental status HPI   HPI Comments: [name] is a 83 y.o. female who presents to the Emergency Department with severe hyperglycemia. She is a current resident of [facility]. Per nursing home staff: The patient was on insuline but it was recently discontinued around a month ago due to the patient having a decreased need for it. They checked her glucose level tonight and it was noted to be greater than 600 x2. It is noted that she hasn't been eating well. Past Medical History:  Diagnosis Date  . CHF (congestive heart failure) ([place])   . Diabetes mellitus without complication ([place])   . Hypertension   . Seizures ([place])   . Syncope   . Thyroid disease     Patient Active Problem List   Diagnosis Date Noted  . Lung cancer ([place]) 03/30/2016  . Constipation 03/30/2016  . Diabetic hyperosmolar non-ketotic state ([place]) 03/30/2016  . Dehydration 03/30/2016  . Hypothermia 03/30/2016  . Lactic acidosis 03/30/2016  . AKI (acute kidney injury) ([place]) 03/30/2016  . Sepsis ([place]) 03/30/2016  . Acute metabolic encephalopathy 57/84/6962  . DNR (do not resuscitate) 03/30/2016  . Hypothyroidism 11/21/2015  . Diabetes mellitus type 2 with complications ([place]) 95/28/4132  . Thrombocytopenia ([place]) 07/14/2015  . Dementia advanced 07/06/2012  . HTN (hypertension) 07/06/2012  . Osteoporosis 07/06/2012  . Convulsions/seizures ([place]) 07/06/2012  . CHF (congestive heart failure) ([place]) 07/06/2012  . CAD (coronary artery disease) of artery bypass graft 07/06/2012  . Anemia  07/06/2012  . Abdominal aortic aneurysm ([place]) 07/06/2012  . Depression 07/06/2012    Past Surgical History:  Procedure Laterality Date  . FRACTURE SURGERY    . Hip surgery for fracture      OB History    No data available       Home Medications    Prior to Admission medications   Medication Sig Start Date End Date Taking? Authorizing Provider  amLODipine (NORVASC) 10 MG tablet Take 10 mg by mouth daily. For HTN    Historical Provider, MD  aspirin 81 MG tablet Take 81 mg by mouth daily.    Historical Provider, MD  calcium carbonate (TUMS - DOSED IN MG ELEMENTAL CALCIUM) 500 MG chewable tablet Chew 1 tablet by mouth 2 (two) times daily. For osteoporosis    Historical Provider, MD  cholecalciferol (VITAMIN D) 1000 UNITS tablet Take 1,000 Units by mouth daily. For osteoporosis    Historical Provider, MD  cloNIDine (CATAPRES) 0.1 MG tablet Take 0.1 mg by mouth 2 (two) times daily. For Hypertension    Historical Provider, MD  docusate sodium (COLACE) 100 MG capsule Take 100 mg by mouth daily.    Historical Provider, MD  feeding supplement, GLUCERNA SHAKE, (GLUCERNA SHAKE) LIQD Take 237 mLs by mouth 3 (three) times daily between meals.    Historical Provider, MD  levETIRAcetam (KEPPRA) 250 MG tablet Take 250 mg by mouth at bedtime. ( 2.5 ml ) oral    Historical Provider, MD  levothyroxine (LEVOTHROID) 25 MCG tablet Take 50 mcg by mouth daily before breakfast. For Hypothyroidism 09/24/15  Hendricks Limes, MD  metoprolol tartrate (LOPRESSOR) 25 MG tablet Take 3 tabs-equal 75 mg by mouth twice a day    Historical Provider, MD  mirtazapine (REMERON) 7.5 MG tablet Take 7.5 mg by mouth at bedtime.    Historical Provider, MD  Multiple Vitamin (MULTIVITAMIN) tablet Take 1 tablet by mouth daily.    Historical Provider, MD  ranitidine (ZANTAC) 150 MG tablet Take 75 mg by mouth 2 (two) times daily.    Historical Provider, MD    Family History No family history on file.  Social History Social  History  Substance Use Topics  . Smoking status: Unknown If Ever Smoked  . Smokeless tobacco: Never Used  . Alcohol use No     Allergies   Penicillins   Review of Systems Review of Systems  Unable to perform ROS: Acuity of condition     Physical Exam Updated Vital Signs BP 118/66 (BP Location: Right Arm)   Pulse 91   Temp (!) 94.4 F (34.7 C) (Rectal)   Resp 22   SpO2 96%   Physical Exam  Constitutional: She appears well-developed and well-nourished.  HENT:  Head: Normocephalic.  Mouth/Throat: Mucous membranes are dry.  Eyes: EOM are normal.  Pupils are forward and equal.  Neck: Normal range of motion.  Cardiovascular:  HR is 90  Pulmonary/Chest: Effort normal.  Abdominal: Soft. She exhibits no distension.  Musculoskeletal: Normal range of motion.  Neurological:  Patient is noted to be moving all 4 extremities.  Skin: Capillary refill takes 2 to 3 seconds.  Psychiatric: She has a normal mood and affect.  Nursing note and vitals reviewed.   ED Treatments / Results  DIAGNOSTIC STUDIES: Oxygen Saturation is 96% on RA, normal by my interpretation.    COORDINATION OF CARE: 11:27 PM Discussed treatment plan with pt at bedside and pt agreed to plan.  Labs (all labs ordered are listed, but only abnormal results are displayed) Labs Reviewed  BLOOD GAS, VENOUS - Abnormal; Notable for the following:       Result Value   pO2, Ven 69.7 (*)    Bicarbonate 19.2 (*)    Acid-base deficit 5.1 (*)    All other components within normal limits  CBC WITH DIFFERENTIAL/PLATELET - Abnormal; Notable for the following:    WBC 21.7 (*)    RBC 5.14 (*)    Hemoglobin 15.5 (*)    HCT 50.3 (*)    Platelets 127 (*)    Neutro Abs 20.1 (*)    Lymphs Abs 0.4 (*)    Monocytes Absolute 1.1 (*)    All other components within normal limits  URINALYSIS, ROUTINE W REFLEX MICROSCOPIC - Abnormal; Notable for the following:    APPearance HAZY (*)    Glucose, UA >=500 (*)    All other  components within normal limits  COMPREHENSIVE METABOLIC PANEL - Abnormal; Notable for the following:    Sodium 147 (*)    Glucose, Bld 1,064 (*)    BUN 74 (*)    Creatinine, Ser 1.73 (*)    GFR calc non Af Amer 25 (*)    GFR calc Af Amer 29 (*)    All other components within normal limits  TROPONIN I - Abnormal; Notable for the following:    Troponin I 0.04 (*)    All other components within normal limits  CBG MONITORING, ED - Abnormal; Notable for the following:    Glucose-Capillary >600 (*)    All other components within normal limits  I-STAT CHEM 8, ED - Abnormal; Notable for the following:    Sodium 153 (*)    Chloride 115 (*)    BUN 67 (*)    Creatinine, Ser 1.40 (*)    Glucose, Bld >700 (*)    Hemoglobin 17.7 (*)    HCT 52.0 (*)    All other components within normal limits  I-STAT CG4 LACTIC ACID, ED - Abnormal; Notable for the following:    Lactic Acid, Venous 5.97 (*)    All other components within normal limits  CBG MONITORING, ED - Abnormal; Notable for the following:    Glucose-Capillary >600 (*)    All other components within normal limits  I-STAT CG4 LACTIC ACID, ED - Abnormal; Notable for the following:    Lactic Acid, Venous 6.27 (*)    All other components within normal limits  CULTURE, BLOOD (ROUTINE X 2)  URINE CULTURE  CULTURE, BLOOD (ROUTINE X 2)  BASIC METABOLIC PANEL  PROCALCITONIN  TSH  I-STAT CG4 LACTIC ACID, ED    EKG  EKG Interpretation None       Radiology Ct Abdomen Pelvis Wo Contrast  Result Date: 03/30/2016 CLINICAL DATA:  Sepsis, hyperglycemia EXAM: CT CHEST, ABDOMEN AND PELVIS WITHOUT CONTRAST TECHNIQUE: Multidetector CT imaging of the chest, abdomen and pelvis was performed following the standard protocol without IV contrast. COMPARISON:  CT from 07/12/2011 of the abdomen and pelvis, CXR from 03/29/16 FINDINGS: CT CHEST FINDINGS Cardiovascular: Dense aortic atherosclerosis without aneurysm. Mild ectasia of the ascending and  descending thoracic aorta. Coronary arteriosclerosis. Normal size cardiac chambers. No pericardial effusion. Mediastinum/Nodes: No mediastinal lymphadenopathy. Visualized thyroid gland is unremarkable. Fluid-filled distention of the distal half of the esophagus likely from reflux secondary to marked fluid-filled gastric distention of stomach. Probable small to moderate hiatal hernia. Lungs/Pleura: Spiculated masslike opacities in the left upper lobe are identified, the largest abuts the major fissure measuring 14 mm on series 3, image 42. Two spiculated densities more cephalad in the left upper lobe are identified, one abutting the major fissure as well measuring 10 x 7 mm and the other measuring approximately 11 x 9 mm, series 3 image 18 and 21. There is atelectasis at the left lung base. No mass or consolidation noted of the right lung. There is mild centrilobular emphysema. Musculoskeletal: Multiple osteoporotic compression fractures, mild at T1, T3, T4, T5, moderate T6, marked T8, mild T11 and moderate T12. Chronic bilateral rib fractures. CT ABDOMEN PELVIS FINDINGS Hepatobiliary: Contracted gallbladder with gallstones. No space-occupying mass of the unenhanced liver. No biliary dilatation. Pancreas: Atrophic without mass. Spleen: No splenomegaly.  No mass. Adrenals/Urinary Tract: Right renal atrophy with cortical thinning. Compensatory hypertrophy of the left kidney. No hydronephrosis nor hydroureter. Punctate lower pole nonobstructing left renal calculus. No renal mass. Physiologically distended bladder. No right adrenal mass. Fullness and thickening of the left adrenal gland. Stomach/Bowel: Marked fluid-filled gastric distention of the stomach. Mild fluid-filled distention of small intestine without obstruction. Moderate distal colonic fecal residue within the transverse through sigmoid colon with fecal impaction in the rectum. No acute inflammation. Vascular/Lymphatic: Densely calcified fusiform infrarenal  abdominal aortic aneurysm terminating at the aortic bifurcation measuring up to 4.1 cm transverse which appears stable. Extension of calcified atherosclerosis into both common iliac arteries and branch vessels. Reproductive: Uterus not identified and may be surgically absent. No adnexal mass. Other: No abdominal wall hernia or abnormality. No abdominopelvic ascites. Musculoskeletal: Kyphoplasty L2, L4 and L5 with mild superior endplate compression of L3. Findings are similar to  prior CT. IMPRESSION: 1. Spiculated left upper lobe pulmonary lesions concerning for neoplasm. The largest abuts the major fissure measuring approximately 14 mm in diameter with two additional spiculated lesions measuring 10 x 7 mm and 11 x 9 mm. 2. Multiple osteoporotic compression fractures of the thoracolumbar spine with vertebral augmentation in the lumbar spine as above. Chronic bilateral rib fractures. No findings of osseous metastatic disease. 3. Diffuse atherosclerosis of thoracic and abdominal aorta with fusiform stable dilatation of the infrarenal aorta up 4.1 cm transverse. 4. Uncomplicated cholelithiasis. 5. Marked fluid-filled gastric distention with reflux of fluid into a small hiatal hernia and mid to distal esophagus. Aspiration precautions recommended. NG tube decompression may prove useful. 6. Fecal impaction. 7. Atrophic right kidney with compensatory hypertrophy of the left kidney. Punctate nonobstructing lower pole left renal calculus. Electronically Signed   By: Ashley Royalty M.D.   On: 03/30/2016 03:31   Ct Head Wo Contrast  Result Date: 03/30/2016 CLINICAL DATA:  Hyperglycemia, altered mental status, sepsis. Alzheimer's disease. EXAM: CT HEAD WITHOUT CONTRAST TECHNIQUE: Contiguous axial images were obtained from the base of the skull through the vertex without intravenous contrast. COMPARISON:  03/23/2005 CT and MRI FINDINGS: BRAIN: There is mild sulcal and moderate ventricular prominence consistent with superficial  and central atrophy. No intraparenchymal hemorrhage, mass effect nor midline shift. Mild-to-moderate degree of periventricular and subcortical white matter hypodensities consistent with chronic small vessel ischemic disease are identified. No acute large vascular territory infarcts. No abnormal extra-axial fluid collections. Basal cisterns are not effaced and midline. VASCULAR: Moderate calcific atherosclerosis of the carotid siphons. SKULL: No skull fracture. No significant scalp soft tissue swelling. SINUSES/ORBITS: The mastoid air-cells are clear. The included paranasal sinuses are well-aerated.The included ocular globes and orbital contents are non-suspicious. OTHER: None. IMPRESSION: Cerebral atrophy with chronic small vessel ischemic disease of periventricular white matter. No acute intracranial abnormality. Electronically Signed   By: Ashley Royalty M.D.   On: 03/30/2016 03:36   Ct Chest Wo Contrast  Result Date: 03/30/2016 CLINICAL DATA:  Sepsis, hyperglycemia EXAM: CT CHEST, ABDOMEN AND PELVIS WITHOUT CONTRAST TECHNIQUE: Multidetector CT imaging of the chest, abdomen and pelvis was performed following the standard protocol without IV contrast. COMPARISON:  CT from 07/12/2011 of the abdomen and pelvis, CXR from 03/29/16 FINDINGS: CT CHEST FINDINGS Cardiovascular: Dense aortic atherosclerosis without aneurysm. Mild ectasia of the ascending and descending thoracic aorta. Coronary arteriosclerosis. Normal size cardiac chambers. No pericardial effusion. Mediastinum/Nodes: No mediastinal lymphadenopathy. Visualized thyroid gland is unremarkable. Fluid-filled distention of the distal half of the esophagus likely from reflux secondary to marked fluid-filled gastric distention of stomach. Probable small to moderate hiatal hernia. Lungs/Pleura: Spiculated masslike opacities in the left upper lobe are identified, the largest abuts the major fissure measuring 14 mm on series 3, image 42. Two spiculated densities more  cephalad in the left upper lobe are identified, one abutting the major fissure as well measuring 10 x 7 mm and the other measuring approximately 11 x 9 mm, series 3 image 18 and 21. There is atelectasis at the left lung base. No mass or consolidation noted of the right lung. There is mild centrilobular emphysema. Musculoskeletal: Multiple osteoporotic compression fractures, mild at T1, T3, T4, T5, moderate T6, marked T8, mild T11 and moderate T12. Chronic bilateral rib fractures. CT ABDOMEN PELVIS FINDINGS Hepatobiliary: Contracted gallbladder with gallstones. No space-occupying mass of the unenhanced liver. No biliary dilatation. Pancreas: Atrophic without mass. Spleen: No splenomegaly.  No mass. Adrenals/Urinary Tract: Right renal atrophy with cortical  thinning. Compensatory hypertrophy of the left kidney. No hydronephrosis nor hydroureter. Punctate lower pole nonobstructing left renal calculus. No renal mass. Physiologically distended bladder. No right adrenal mass. Fullness and thickening of the left adrenal gland. Stomach/Bowel: Marked fluid-filled gastric distention of the stomach. Mild fluid-filled distention of small intestine without obstruction. Moderate distal colonic fecal residue within the transverse through sigmoid colon with fecal impaction in the rectum. No acute inflammation. Vascular/Lymphatic: Densely calcified fusiform infrarenal abdominal aortic aneurysm terminating at the aortic bifurcation measuring up to 4.1 cm transverse which appears stable. Extension of calcified atherosclerosis into both common iliac arteries and branch vessels. Reproductive: Uterus not identified and may be surgically absent. No adnexal mass. Other: No abdominal wall hernia or abnormality. No abdominopelvic ascites. Musculoskeletal: Kyphoplasty L2, L4 and L5 with mild superior endplate compression of L3. Findings are similar to prior CT. IMPRESSION: 1. Spiculated left upper lobe pulmonary lesions concerning for  neoplasm. The largest abuts the major fissure measuring approximately 14 mm in diameter with two additional spiculated lesions measuring 10 x 7 mm and 11 x 9 mm. 2. Multiple osteoporotic compression fractures of the thoracolumbar spine with vertebral augmentation in the lumbar spine as above. Chronic bilateral rib fractures. No findings of osseous metastatic disease. 3. Diffuse atherosclerosis of thoracic and abdominal aorta with fusiform stable dilatation of the infrarenal aorta up 4.1 cm transverse. 4. Uncomplicated cholelithiasis. 5. Marked fluid-filled gastric distention with reflux of fluid into a small hiatal hernia and mid to distal esophagus. Aspiration precautions recommended. NG tube decompression may prove useful. 6. Fecal impaction. 7. Atrophic right kidney with compensatory hypertrophy of the left kidney. Punctate nonobstructing lower pole left renal calculus. Electronically Signed   By: Ashley Royalty M.D.   On: 03/30/2016 03:31   Dg Chest Port 1 View  Result Date: 03/30/2016 CLINICAL DATA:  Hyperglycemia, confusion and restlessness. EXAM: PORTABLE CHEST 1 VIEW COMPARISON:  08/08/2010 CXR FINDINGS: Median 13 x 14 mm masslike opacity in the left mid lung. Stable cardiomegaly with tortuous thoracic aorta. Aortic atherosclerosis is again noted. Mild central vascular congestion. Subclavian and axillary atherosclerosis bilaterally. No pneumonic consolidation, effusion or pneumothorax. Left costophrenic angle is excluded on current exam. The patient is status post upper lumbar vertebroplasty. Chain sutures are seen along the medial right upper lobe. IMPRESSION: New masslike opacity in the left mid lung measuring 13 x 14 mm. CT may help for further correlation. Stable cardiomegaly with aortic atherosclerosis. Mild central vascular congestion. Chain sutures in the right upper lobe. Electronically Signed   By: Ashley Royalty M.D.   On: 03/30/2016 00:18    Procedures Procedures (including critical care  time)  CRITICAL CARE Performed by: Varney Biles   Total critical care time: 85 minutes  Critical care time was exclusive of separately billable procedures and treating other patients.  Critical care was necessary to treat or prevent imminent or life-threatening deterioration.  Critical care was time spent personally by me on the following activities: development of treatment plan with patient and/or surrogate as well as nursing, discussions with consultants, evaluation of patient's response to treatment, examination of patient, obtaining history from patient or surrogate, ordering and performing treatments and interventions, ordering and review of laboratory studies, ordering and review of radiographic studies, pulse oximetry and re-evaluation of patient's condition.   Medications Ordered in ED Medications  insulin regular (NOVOLIN R,HUMULIN R) 250 Units in sodium chloride 0.9 % 250 mL (1 Units/mL) infusion (5.4 Units/hr Intravenous New Bag/Given 03/30/16 0108)  sodium chloride 0.9 % bolus  1,000 mL (1,000 mLs Intravenous New Bag/Given 03/30/16 0112)    And  0.9 %  sodium chloride infusion ( Intravenous New Bag/Given 03/30/16 0111)  dextrose 5 %-0.45 % sodium chloride infusion (not administered)  vancomycin (VANCOCIN) IVPB 1000 mg/200 mL premix (not administered)  ceFEPIme (MAXIPIME) 1 g in dextrose 5 % 50 mL IVPB (not administered)  sodium chloride 0.9 % bolus 1,000 mL (not administered)  LORazepam (ATIVAN) tablet 0.5 mg (not administered)  sodium phosphate (FLEET) 7-19 GM/118ML enema 1 enema (not administered)  metroNIDAZOLE (FLAGYL) IVPB 500 mg (500 mg Intravenous New Bag/Given 03/30/16 0210)  LORazepam (ATIVAN) injection 0.5 mg (0.5 mg Intravenous Given 03/30/16 0414)     Initial Impression / Assessment and Plan / ED Course  I have reviewed the triage vital signs and the nursing notes.  Pertinent labs & imaging results that were available during my care of the patient were  reviewed by me and considered in my medical decision making (see chart for details).  Clinical Course as of Mar 31 507  Fri Mar 30, 2016  0120 RN only has 1 iv - they are aware that pt will need broad spectrum antibiotics as well. Advised cefepime as the first antibiotics. Will place central line if they are unable to get more iv access.  [AN]  0145 Sepsis reassessment completed. Pt is more alert now.  Repeat vitals will be checked. Fluids and antibiotics going. Skin is cool to touch.  [AN]  0407 CT results reviewed. Repeat lactate, BMP ordered. No clear source - there is a good chance that there is no underlying infection and the hypothermia, elevated WC are all related to hyperglycemia. Also - there is no clear DKA. Lactate could be all due to dehydration.  [AN]  0506 Lactate rising. Pt is s/p 2 liters of ivf. She has gotten broad spectrum antibiotics. She had CT scans done, and there is no clear signs of infection. Now - concerns for sepsis are higher. I don't think LP is a good option now - pt will need anesthesia, and with her compression fractures likely IR guided LP if r/o meningitis is considered. The rising lactate despite resuscitation here shows poor prognosis.  Lactic Acid, Venous: (!!) 6.27 [AN]    Clinical Course User Index [AN] Varney Biles, MD    Pt comes in with cc of confusion. It seems that her insulin was discontinued due to poor intake, but then she started eating better and the blood sugar when checked today was high.  Currently she is not following commands, and we suspect DKA. There could also be underlying infection. Abd appears tender, but there is no peritoneal signs.   Final Clinical Impressions(s) / ED Diagnoses   Final diagnoses:  Lactic acidosis  Hyperglycemia  AKI (acute kidney injury) ([facility])  Malignant neoplasm of lung, unspecified laterality, unspecified part of lung ([place])    New Prescriptions New Prescriptions   No medications on file    I personally performed the services described in this documentation, which was scribed in my presence. The recorded information has been reviewed and is accurate.     Varney Biles, MD 03/30/16 [place], MD 03/30/16 1610

## 2016-03-29 NOTE — ED Triage Notes (Signed)
Pt is resident of [facility], sent over tonight for high blood sugar (over 600 per facility)

## 2016-03-30 ENCOUNTER — Emergency Department (HOSPITAL_COMMUNITY): Payer: Medicare Other

## 2016-03-30 ENCOUNTER — Telehealth: Payer: Self-pay

## 2016-03-30 ENCOUNTER — Encounter (HOSPITAL_COMMUNITY): Payer: Self-pay | Admitting: Primary Care

## 2016-03-30 ENCOUNTER — Encounter (HOSPITAL_COMMUNITY)
Admission: RE | Admit: 2016-03-30 | Discharge: 2016-03-30 | Disposition: A | Discharge status: Home / Self Care | Payer: Medicare Other | Source: Skilled Nursing Facility | Attending: Internal Medicine | Admitting: Internal Medicine

## 2016-03-30 DIAGNOSIS — E872 Acidosis, unspecified: Secondary | ICD-10-CM | POA: Diagnosis present

## 2016-03-30 DIAGNOSIS — E1100 Type 2 diabetes mellitus with hyperosmolarity without nonketotic hyperglycemic-hyperosmolar coma (NKHHC): Secondary | ICD-10-CM | POA: Diagnosis present

## 2016-03-30 DIAGNOSIS — E039 Hypothyroidism, unspecified: Secondary | ICD-10-CM | POA: Diagnosis present

## 2016-03-30 DIAGNOSIS — N179 Acute kidney failure, unspecified: Secondary | ICD-10-CM

## 2016-03-30 DIAGNOSIS — R64 Cachexia: Secondary | ICD-10-CM | POA: Diagnosis present

## 2016-03-30 DIAGNOSIS — Z515 Encounter for palliative care: Secondary | ICD-10-CM

## 2016-03-30 DIAGNOSIS — F0281 Dementia in other diseases classified elsewhere with behavioral disturbance: Secondary | ICD-10-CM

## 2016-03-30 DIAGNOSIS — G308 Other Alzheimer's disease: Secondary | ICD-10-CM

## 2016-03-30 DIAGNOSIS — E876 Hypokalemia: Secondary | ICD-10-CM | POA: Diagnosis not present

## 2016-03-30 DIAGNOSIS — K5900 Constipation, unspecified: Secondary | ICD-10-CM | POA: Diagnosis not present

## 2016-03-30 DIAGNOSIS — E060 Acute thyroiditis: Secondary | ICD-10-CM | POA: Diagnosis present

## 2016-03-30 DIAGNOSIS — F419 Anxiety disorder, unspecified: Secondary | ICD-10-CM | POA: Diagnosis present

## 2016-03-30 DIAGNOSIS — I110 Hypertensive heart disease with heart failure: Secondary | ICD-10-CM | POA: Diagnosis present

## 2016-03-30 DIAGNOSIS — J189 Pneumonia, unspecified organism: Secondary | ICD-10-CM | POA: Diagnosis present

## 2016-03-30 DIAGNOSIS — M810 Age-related osteoporosis without current pathological fracture: Secondary | ICD-10-CM | POA: Diagnosis present

## 2016-03-30 DIAGNOSIS — C3490 Malignant neoplasm of unspecified part of unspecified bronchus or lung: Secondary | ICD-10-CM

## 2016-03-30 DIAGNOSIS — T68XXXS Hypothermia, sequela: Secondary | ICD-10-CM

## 2016-03-30 DIAGNOSIS — A412 Sepsis due to unspecified staphylococcus: Secondary | ICD-10-CM | POA: Diagnosis present

## 2016-03-30 DIAGNOSIS — R7881 Bacteremia: Secondary | ICD-10-CM | POA: Diagnosis not present

## 2016-03-30 DIAGNOSIS — I5033 Acute on chronic diastolic (congestive) heart failure: Secondary | ICD-10-CM | POA: Diagnosis present

## 2016-03-30 DIAGNOSIS — Z9181 History of falling: Secondary | ICD-10-CM | POA: Insufficient documentation

## 2016-03-30 DIAGNOSIS — I248 Other forms of acute ischemic heart disease: Secondary | ICD-10-CM | POA: Diagnosis present

## 2016-03-30 DIAGNOSIS — I714 Abdominal aortic aneurysm, without rupture: Secondary | ICD-10-CM | POA: Diagnosis present

## 2016-03-30 DIAGNOSIS — G40909 Epilepsy, unspecified, not intractable, without status epilepticus: Secondary | ICD-10-CM | POA: Diagnosis present

## 2016-03-30 DIAGNOSIS — G309 Alzheimer's disease, unspecified: Secondary | ICD-10-CM | POA: Diagnosis present

## 2016-03-30 DIAGNOSIS — A419 Sepsis, unspecified organism: Secondary | ICD-10-CM | POA: Diagnosis not present

## 2016-03-30 DIAGNOSIS — G9341 Metabolic encephalopathy: Secondary | ICD-10-CM | POA: Diagnosis present

## 2016-03-30 DIAGNOSIS — E875 Hyperkalemia: Secondary | ICD-10-CM | POA: Diagnosis present

## 2016-03-30 DIAGNOSIS — T68XXXA Hypothermia, initial encounter: Secondary | ICD-10-CM | POA: Diagnosis present

## 2016-03-30 DIAGNOSIS — N170 Acute kidney failure with tubular necrosis: Secondary | ICD-10-CM | POA: Diagnosis present

## 2016-03-30 DIAGNOSIS — I2510 Atherosclerotic heart disease of native coronary artery without angina pectoris: Secondary | ICD-10-CM | POA: Diagnosis present

## 2016-03-30 DIAGNOSIS — F0280 Dementia in other diseases classified elsewhere without behavioral disturbance: Secondary | ICD-10-CM | POA: Diagnosis present

## 2016-03-30 DIAGNOSIS — I10 Essential (primary) hypertension: Secondary | ICD-10-CM | POA: Insufficient documentation

## 2016-03-30 DIAGNOSIS — E860 Dehydration: Secondary | ICD-10-CM | POA: Diagnosis present

## 2016-03-30 DIAGNOSIS — Z66 Do not resuscitate: Secondary | ICD-10-CM

## 2016-03-30 DIAGNOSIS — R6520 Severe sepsis without septic shock: Secondary | ICD-10-CM | POA: Diagnosis present

## 2016-03-30 DIAGNOSIS — M4854XA Collapsed vertebra, not elsewhere classified, thoracic region, initial encounter for fracture: Secondary | ICD-10-CM | POA: Diagnosis present

## 2016-03-30 DIAGNOSIS — R4182 Altered mental status, unspecified: Secondary | ICD-10-CM | POA: Diagnosis present

## 2016-03-30 DIAGNOSIS — Z681 Body mass index (BMI) 19 or less, adult: Secondary | ICD-10-CM | POA: Diagnosis not present

## 2016-03-30 LAB — GLUCOSE, CAPILLARY
GLUCOSE-CAPILLARY: 96 mg/dL (ref 65–99)
GLUCOSE-CAPILLARY: 104 mg/dL — AB (ref 65–99)
GLUCOSE-CAPILLARY: 131 mg/dL — AB (ref 65–99)
Glucose-Capillary: 144 mg/dL — ABNORMAL HIGH (ref 65–99)
Glucose-Capillary: 91 mg/dL (ref 65–99)
Glucose-Capillary: 139 mg/dL — ABNORMAL HIGH (ref 65–99)

## 2016-03-30 LAB — BASIC METABOLIC PANEL
ANION GAP: 14 (ref 5–15)
ANION GAP: 9 (ref 5–15)
Anion gap: 10 (ref 5–15)
BUN: 71 mg/dL — AB (ref 6–20)
BUN: 59 mg/dL — AB (ref 6–20)
BUN: 55 mg/dL — ABNORMAL HIGH (ref 6–20)
CALCIUM: 8.9 mg/dL (ref 8.9–10.3)
CALCIUM: 8.6 mg/dL — AB (ref 8.9–10.3)
CO2: 16 mmol/L — AB (ref 22–32)
CO2: 15 mmol/L — AB (ref 22–32)
CO2: 17 mmol/L — AB (ref 22–32)
CREATININE: 1.51 mg/dL — AB (ref 0.44–1.00)
CREATININE: 1.25 mg/dL — AB (ref 0.44–1.00)
Calcium: 8.7 mg/dL — ABNORMAL LOW (ref 8.9–10.3)
Chloride: 126 mmol/L — ABNORMAL HIGH (ref 101–111)
Chloride: 130 mmol/L — ABNORMAL HIGH (ref 101–111)
Chloride: 131 mmol/L (ref 101–111)
Creatinine, Ser: 1.18 mg/dL — ABNORMAL HIGH (ref 0.44–1.00)
GFR calc Af Amer: 34 mL/min — ABNORMAL LOW (ref >60)
GFR calc Af Amer: 43 mL/min — ABNORMAL LOW (ref >60)
GFR calc non Af Amer: 37 mL/min — ABNORMAL LOW (ref >60)
GFR, EST AFRICAN AMERICAN: 46 mL/min — AB (ref >60)
GFR, EST NON AFRICAN AMERICAN: 30 mL/min — AB (ref >60)
GFR, EST NON AFRICAN AMERICAN: 40 mL/min — AB (ref >60)
GLUCOSE: 591 mg/dL — AB (ref 65–99)
GLUCOSE: 189 mg/dL — AB (ref 65–99)
Glucose, Bld: 106 mg/dL — ABNORMAL HIGH (ref 65–99)
Potassium: 2.5 mmol/L — CL (ref 3.5–5.1)
Potassium: 2.5 mmol/L — CL (ref 3.5–5.1)
Potassium: 3 mmol/L — ABNORMAL LOW (ref 3.5–5.1)
Sodium: 156 mmol/L — ABNORMAL HIGH (ref 135–145)
Sodium: 155 mmol/L — ABNORMAL HIGH (ref 135–145)
Sodium: 157 mmol/L — ABNORMAL HIGH (ref 135–145)

## 2016-03-30 LAB — URINALYSIS, ROUTINE W REFLEX MICROSCOPIC
Bacteria, UA: NONE SEEN
Bilirubin Urine: NEGATIVE
HGB URINE DIPSTICK: NEGATIVE
Ketones, ur: NEGATIVE mg/dL
Leukocytes, UA: NEGATIVE
NITRITE: NEGATIVE
PH: 5 (ref 5.0–8.0)
PROTEIN: NEGATIVE mg/dL
SPECIFIC GRAVITY, URINE: 1.021 (ref 1.005–1.030)

## 2016-03-30 LAB — BLOOD CULTURE ID PANEL (REFLEXED)
Acinetobacter baumannii: NOT DETECTED
CANDIDA KRUSEI: NOT DETECTED
CANDIDA PARAPSILOSIS: NOT DETECTED
CANDIDA TROPICALIS: NOT DETECTED
Candida albicans: NOT DETECTED
Candida glabrata: NOT DETECTED
ESCHERICHIA COLI: NOT DETECTED
Enterobacter cloacae complex: NOT DETECTED
Enterobacteriaceae species: NOT DETECTED
Enterococcus species: NOT DETECTED
HAEMOPHILUS INFLUENZAE: NOT DETECTED
KLEBSIELLA OXYTOCA: NOT DETECTED
KLEBSIELLA PNEUMONIAE: NOT DETECTED
Listeria monocytogenes: NOT DETECTED
Methicillin resistance: NOT DETECTED
Neisseria meningitidis: NOT DETECTED
PROTEUS SPECIES: NOT DETECTED
Pseudomonas aeruginosa: NOT DETECTED
SERRATIA MARCESCENS: NOT DETECTED
STAPHYLOCOCCUS AUREUS BCID: NOT DETECTED
STAPHYLOCOCCUS SPECIES: DETECTED — AB
STREPTOCOCCUS PNEUMONIAE: NOT DETECTED
Streptococcus agalactiae: NOT DETECTED
Streptococcus pyogenes: NOT DETECTED
Streptococcus species: NOT DETECTED

## 2016-03-30 LAB — POCT I-STAT, CHEM 8
BUN: 66 mg/dL — ABNORMAL HIGH (ref 6–20)
CALCIUM ION: 1.23 mmol/L (ref 1.15–1.40)
CHLORIDE: 113 mmol/L — AB (ref 101–111)
Creatinine, Ser: 1.3 mg/dL — ABNORMAL HIGH (ref 0.44–1.00)
Glucose, Bld: >700 mg/dL (ref 65–99)
HEMATOCRIT: 49 % — AB (ref 36.0–46.0)
Hemoglobin: 16.7 g/dL — ABNORMAL HIGH (ref 12.0–15.0)
POTASSIUM: 3.6 mmol/L (ref 3.5–5.1)
SODIUM: 153 mmol/L — AB (ref 135–145)
TCO2: 23 mmol/L (ref 0–100)

## 2016-03-30 LAB — COMPREHENSIVE METABOLIC PANEL
ALBUMIN: 3.7 g/dL (ref 3.5–5.0)
ALT: 22 U/L (ref 14–54)
ANION GAP: 15 (ref 5–15)
AST: 23 U/L (ref 15–41)
Alkaline Phosphatase: 87 U/L (ref 38–126)
BUN: 74 mg/dL — AB (ref 6–20)
CHLORIDE: 110 mmol/L (ref 101–111)
CO2: 22 mmol/L (ref 22–32)
Calcium: 9.4 mg/dL (ref 8.9–10.3)
Creatinine, Ser: 1.73 mg/dL — ABNORMAL HIGH (ref 0.44–1.00)
GFR calc Af Amer: 29 mL/min — ABNORMAL LOW (ref >60)
GFR calc non Af Amer: 25 mL/min — ABNORMAL LOW (ref >60)
GLUCOSE: 1064 mg/dL — AB (ref 65–99)
POTASSIUM: 3.5 mmol/L (ref 3.5–5.1)
SODIUM: 147 mmol/L — AB (ref 135–145)
TOTAL PROTEIN: 8.1 g/dL (ref 6.5–8.1)
Total Bilirubin: 0.8 mg/dL (ref 0.3–1.2)

## 2016-03-30 LAB — BLOOD GAS, VENOUS
ACID-BASE DEFICIT: 5.1 mmol/L — AB (ref 0.0–2.0)
BICARBONATE: 19.2 mmol/L — AB (ref 20.0–28.0)
DRAWN BY: 1528
O2 Saturation: 89.3 %
PATIENT TEMPERATURE: 37
PH VEN: 7.257 (ref 7.250–7.430)
pCO2, Ven: 48.5 mmHg (ref 44.0–60.0)
pO2, Ven: 69.7 mmHg — ABNORMAL HIGH (ref 32.0–45.0)

## 2016-03-30 LAB — CBG MONITORING, ED
GLUCOSE-CAPILLARY: 411 mg/dL — AB (ref 65–99)
GLUCOSE-CAPILLARY: 326 mg/dL — AB (ref 65–99)
Glucose-Capillary: 473 mg/dL — ABNORMAL HIGH (ref 65–99)
Glucose-Capillary: 238 mg/dL — ABNORMAL HIGH (ref 65–99)
Glucose-Capillary: 181 mg/dL — ABNORMAL HIGH (ref 65–99)

## 2016-03-30 LAB — I-STAT CHEM 8, ED
BUN: 67 mg/dL — ABNORMAL HIGH (ref 6–20)
CALCIUM ION: 1.19 mmol/L (ref 1.15–1.40)
CHLORIDE: 115 mmol/L — AB (ref 101–111)
Creatinine, Ser: 1.4 mg/dL — ABNORMAL HIGH (ref 0.44–1.00)
HEMATOCRIT: 52 % — AB (ref 36.0–46.0)
Hemoglobin: 17.7 g/dL — ABNORMAL HIGH (ref 12.0–15.0)
Potassium: 3.6 mmol/L (ref 3.5–5.1)
SODIUM: 153 mmol/L — AB (ref 135–145)
TCO2: 22 mmol/L (ref 0–100)

## 2016-03-30 LAB — TROPONIN I
TROPONIN I: 0.04 ng/mL — AB (ref <0.03)
TROPONIN I: 0.1 ng/mL — AB (ref <0.03)
TROPONIN I: 0.15 ng/mL — AB (ref <0.03)

## 2016-03-30 LAB — CG4 I-STAT (LACTIC ACID): Lactic Acid, Venous: 5.58 mmol/L (ref 0.5–1.9)

## 2016-03-30 LAB — CBC WITH DIFFERENTIAL/PLATELET
BASOS ABS: 0 10*3/uL (ref 0.0–0.1)
BASOS PCT: 0 %
EOS ABS: 0 10*3/uL (ref 0.0–0.7)
Eosinophils Relative: 0 %
HCT: 50.3 % — ABNORMAL HIGH (ref 36.0–46.0)
Hemoglobin: 15.5 g/dL — ABNORMAL HIGH (ref 12.0–15.0)
Lymphocytes Relative: 2 %
Lymphs Abs: 0.4 10*3/uL — ABNORMAL LOW (ref 0.7–4.0)
MCH: 30.2 pg (ref 26.0–34.0)
MCHC: 30.8 g/dL (ref 30.0–36.0)
MCV: 97.9 fL (ref 78.0–100.0)
MONO ABS: 1.1 10*3/uL — AB (ref 0.1–1.0)
MONOS PCT: 5 %
NEUTROS PCT: 93 %
Neutro Abs: 20.1 10*3/uL — ABNORMAL HIGH (ref 1.7–7.7)
Platelets: 127 10*3/uL — ABNORMAL LOW (ref 150–400)
RBC: 5.14 MIL/uL — ABNORMAL HIGH (ref 3.87–5.11)
RDW: 14.9 % (ref 11.5–15.5)
WBC: 21.7 10*3/uL — ABNORMAL HIGH (ref 4.0–10.5)

## 2016-03-30 LAB — I-STAT CG4 LACTIC ACID, ED
LACTIC ACID, VENOUS: 6.27 mmol/L — AB (ref 0.5–1.9)
Lactic Acid, Venous: 5.97 mmol/L (ref 0.5–1.9)

## 2016-03-30 LAB — TSH: TSH: 2.997 u[IU]/mL (ref 0.350–4.500)

## 2016-03-30 LAB — PROCALCITONIN: Procalcitonin: 1.14 ng/mL

## 2016-03-30 LAB — MAGNESIUM: Magnesium: 2.3 mg/dL (ref 1.7–2.4)

## 2016-03-30 LAB — LACTIC ACID, PLASMA
LACTIC ACID, VENOUS: 3.7 mmol/L — AB (ref 0.5–1.9)
Lactic Acid, Venous: 5.3 mmol/L (ref 0.5–1.9)

## 2016-03-30 LAB — PROTIME-INR
INR: 1.19
Prothrombin Time: 15.2 seconds (ref 11.4–15.2)

## 2016-03-30 LAB — APTT: APTT: 30 s (ref 24–36)

## 2016-03-30 LAB — MRSA PCR SCREENING: MRSA by PCR: NEGATIVE

## 2016-03-30 MED ORDER — SODIUM CHLORIDE 0.9 % IV BOLUS (SEPSIS)
1000.0000 mL | Freq: Once | INTRAVENOUS | Status: AC
Start: 2016-03-30 — End: 2016-03-30
  Administered 2016-03-30: 1000 mL via INTRAVENOUS

## 2016-03-30 MED ORDER — SODIUM CHLORIDE 0.9 % IV SOLN
INTRAVENOUS | Status: DC
  Filled 2016-03-30: qty 2.5

## 2016-03-30 MED ORDER — INSULIN REGULAR BOLUS VIA INFUSION
0.0000 [IU] | Freq: Three times a day (TID) | INTRAVENOUS | Status: DC
  Filled 2016-03-30: qty 10

## 2016-03-30 MED ORDER — POTASSIUM CHLORIDE 10 MEQ/100ML IV SOLN
INTRAVENOUS | Status: AC
  Administered 2016-03-30: 10 meq via INTRAVENOUS
  Filled 2016-03-30: qty 100

## 2016-03-30 MED ORDER — SODIUM CHLORIDE 0.9 % IV SOLN: INTRAVENOUS | Status: DC

## 2016-03-30 MED ORDER — METOPROLOL TARTRATE 5 MG/5ML IV SOLN
5.0000 mg | INTRAVENOUS | Status: DC | PRN
  Administered 2016-03-31: 5 mg via INTRAVENOUS
  Filled 2016-03-30: qty 5

## 2016-03-30 MED ORDER — LEVETIRACETAM 500 MG/5ML IV SOLN
INTRAVENOUS | Status: AC
  Filled 2016-03-30: qty 5

## 2016-03-30 MED ORDER — CLONIDINE HCL 0.1 MG/24HR TD PTWK
0.1000 mg | MEDICATED_PATCH | TRANSDERMAL | Status: DC
  Administered 2016-03-30: 0.1 mg via TRANSDERMAL
  Filled 2016-03-30: qty 1

## 2016-03-30 MED ORDER — SODIUM CHLORIDE 0.9 % IV SOLN
250.0000 mg | INTRAVENOUS | Status: DC
  Administered 2016-03-31: 250 mg via INTRAVENOUS
  Filled 2016-03-30 (×3): qty 2.5

## 2016-03-30 MED ORDER — POTASSIUM CHLORIDE 10 MEQ/100ML IV SOLN
INTRAVENOUS | Status: AC
  Administered 2016-03-30: 10 meq
  Filled 2016-03-30: qty 100

## 2016-03-30 MED ORDER — ENOXAPARIN SODIUM 40 MG/0.4ML ~~LOC~~ SOLN: 40.0000 mg | SUBCUTANEOUS | Status: DC

## 2016-03-30 MED ORDER — DEXTROSE 5 % IV SOLN
1.0000 g | INTRAVENOUS | Status: DC
  Administered 2016-03-31: 1 g via INTRAVENOUS
  Filled 2016-03-30 (×3): qty 1

## 2016-03-30 MED ORDER — DEXTROSE-NACL 5-0.45 % IV SOLN: INTRAVENOUS | Status: DC

## 2016-03-30 MED ORDER — MAGNESIUM SULFATE IN D5W 1-5 GM/100ML-% IV SOLN
1.0000 g | Freq: Once | INTRAVENOUS | Status: AC
  Administered 2016-03-30: 1 g via INTRAVENOUS
  Filled 2016-03-30: qty 100

## 2016-03-30 MED ORDER — SODIUM CHLORIDE 0.9 % IV SOLN
500.0000 mg | INTRAVENOUS | Status: DC
  Administered 2016-03-31: 500 mg via INTRAVENOUS
  Filled 2016-03-30 (×3): qty 500

## 2016-03-30 MED ORDER — FLEET ENEMA 7-19 GM/118ML RE ENEM: 1.0000 | ENEMA | Freq: Once | RECTAL | Status: DC

## 2016-03-30 MED ORDER — LEVALBUTEROL HCL 0.63 MG/3ML IN NEBU
0.6300 mg | INHALATION_SOLUTION | Freq: Three times a day (TID) | RESPIRATORY_TRACT | Status: DC
  Administered 2016-03-30 – 2016-03-31 (×4): 0.63 mg via RESPIRATORY_TRACT
  Filled 2016-03-30 (×5): qty 3

## 2016-03-30 MED ORDER — KCL IN DEXTROSE-NACL 40-5-0.45 MEQ/L-%-% IV SOLN
INTRAVENOUS | Status: DC
  Administered 2016-03-30 (×2): via INTRAVENOUS
  Filled 2016-03-30 (×10): qty 1000

## 2016-03-30 MED ORDER — VANCOMYCIN HCL IN DEXTROSE 1-5 GM/200ML-% IV SOLN
1000.0000 mg | Freq: Once | INTRAVENOUS | Status: AC
Start: 2016-03-30 — End: 2016-03-30
  Administered 2016-03-30: 1000 mg via INTRAVENOUS
  Filled 2016-03-30: qty 200

## 2016-03-30 MED ORDER — INSULIN ASPART 100 UNIT/ML ~~LOC~~ SOLN
0.0000 [IU] | SUBCUTANEOUS | Status: DC
  Administered 2016-03-30: 2 [IU] via SUBCUTANEOUS
  Administered 2016-03-31: 3 [IU] via SUBCUTANEOUS

## 2016-03-30 MED ORDER — LORAZEPAM 0.5 MG PO TABS
0.5000 mg | ORAL_TABLET | Freq: Once | ORAL | Status: DC
  Filled 2016-03-30: qty 1

## 2016-03-30 MED ORDER — LORAZEPAM 2 MG/ML IJ SOLN
0.5000 mg | Freq: Once | INTRAMUSCULAR | Status: AC
  Administered 2016-03-30: 0.5 mg via INTRAVENOUS
  Filled 2016-03-30: qty 1

## 2016-03-30 MED ORDER — POTASSIUM CHLORIDE 10 MEQ/100ML IV SOLN
10.0000 meq | INTRAVENOUS | Status: AC
  Administered 2016-03-30 (×4): 10 meq via INTRAVENOUS
  Filled 2016-03-30 (×2): qty 100

## 2016-03-30 MED ORDER — FUROSEMIDE 10 MG/ML IJ SOLN
20.0000 mg | Freq: Two times a day (BID) | INTRAMUSCULAR | Status: DC | PRN
  Administered 2016-03-30: 20 mg via INTRAVENOUS
  Filled 2016-03-30 (×2): qty 2

## 2016-03-30 MED ORDER — POTASSIUM CHLORIDE CRYS ER 20 MEQ PO TBCR: 40.0000 meq | EXTENDED_RELEASE_TABLET | Freq: Four times a day (QID) | ORAL | Status: DC

## 2016-03-30 MED ORDER — DEXTROSE 50 % IV SOLN: 25.0000 mL | INTRAVENOUS | Status: DC | PRN

## 2016-03-30 MED ORDER — ENOXAPARIN SODIUM 30 MG/0.3ML ~~LOC~~ SOLN
30.0000 mg | SUBCUTANEOUS | Status: DC
  Administered 2016-03-30: 30 mg via SUBCUTANEOUS
  Filled 2016-03-30: qty 0.3

## 2016-03-30 MED ORDER — SODIUM CHLORIDE 0.9 % IV SOLN
INTRAVENOUS | Status: AC
  Filled 2016-03-30: qty 2.5

## 2016-03-30 MED ORDER — METRONIDAZOLE IN NACL 5-0.79 MG/ML-% IV SOLN
500.0000 mg | Freq: Once | INTRAVENOUS | Status: AC
  Administered 2016-03-30: 500 mg via INTRAVENOUS
  Filled 2016-03-30: qty 100

## 2016-03-30 MED ORDER — DEXTROSE 5 % IV SOLN
1.0000 g | Freq: Two times a day (BID) | INTRAVENOUS | Status: DC
  Administered 2016-03-30: 1 g via INTRAVENOUS
  Filled 2016-03-30 (×7): qty 1

## 2016-03-30 NOTE — Progress Notes (Signed)
Pt vomited moderate amount light brown emesis

## 2016-03-30 NOTE — Progress Notes (Addendum)
Patient is an 83 year old woman with a history of advanced dementia-bedbound, CAD diabetes mellitus, CHF with an EF of 55-65% per echo 08/2004, seizure disorder, HTN, and hypothyroidism. She was admitted this morning by Dr. Shanon Brow for altered mental status and elevated blood sugars at the SNF. In the ED, she was found to have hypothermia with a temperature of 94.4, SBP in the 80s to 100s, tachycardia, and transient hypoxia. Her blood glucose on admission was 1064. Her AG and CO2 were within normal limits. Her BUN was 74, creatinine 1.73, WBC 21.7, platelet count was 127 and her UA and CXR were not consistent with infection. Her lactic acid was elevated at 5.97 and procalcitonin was normal at 1.14.  -CT chest reveals spiculated left upper lobe pulmonary lesions concerning for neoplasm; multiple osteoporotic compression fractures of the thoracolumbar spine. -CT of the abdomen revealed marketed fluid filled gastric distention with reflux of fluid and fecal impaction. -TSH was within normal limits at 2.9.   1. We'll continue the glucommander insulin protocol with changes in IV fluids per the protocol. Continue IV fluid hydration. 2. We'll continue to replete her potassium chloride due to her serum potassium falling to 2.5; with IV runs and potassium added to the IV fluids. 3. Will order BEMET every 2 hours 5 and daily thereafter. 4. Will add Foley catheter due to incontinence and sepsis. 5. We'll continue broad-spectrum antibiotics with cefepime and vancomycin for now. Blood cultures ordered and are pending. 6. Continue palliative care consult. Patient is noted to be a DO NOT RESUSCITATE. 7. Will eventually notify the family of left lung masses, likely lung cancer.

## 2016-03-30 NOTE — Progress Notes (Signed)
Pharmacy Antibiotic Note  TIRA LAFFERTY is a 83 y.o. female admitted on 03/29/2016 with sepsis.  Pharmacy has been consulted for [facility] AND CEFEPIME dosing.  Pt with small body habitus and elevated SCr.  Plan:  Vancomycin '500mg'$  IV q24h Check trough at steady state Cefepime 1gm IV q24hrs Monitor labs, renal fxn, progress and c/s Deescalate ABX when improved / appropriate.       Temp (24hrs), Avg:95.9 F (35.5 C), Min:94.4 F (34.7 C), Max:97.4 F (36.3 C)   Recent Labs Lab 03/29/16 2357 03/30/16 0056 03/30/16 0057 03/30/16 0413 03/30/16 0424  WBC 21.7*  --   --   --   --   CREATININE 1.73*  --  1.40* 1.51*  --   LATICACIDVEN  --  5.97*  --   --  6.27*    CrCl cannot be calculated (Unknown ideal weight.).    Allergies  Allergen Reactions  . Penicillins    Antimicrobials this admission: Cefepime 1/26 >>  Vancomycin 1/26 >>   Dose adjustments this admission:  Microbiology results:  BCx: pending  UCx: pending   Sputum:    MRSA PCR:   Thank you for allowing pharmacy to be a part of this patient's care.  Hart Robinsons A 03/30/2016 10:41 AM

## 2016-03-30 NOTE — Progress Notes (Signed)
Verbal order given by Dr Caryn Section to Georgina Peer, RN, read back and verified, for BMET to be done one post completion of last run of KCL. Gerome Sam, RN notified in shift report.

## 2016-03-30 NOTE — Telephone Encounter (Signed)
Possible re-admission to facility. This is a patient you were seeing at [facility]. [facility] F/U is needed if patient was re-admitted to facility upon discharge. Hospital discharge from [facility] on 03/29/16.

## 2016-03-30 NOTE — ED Notes (Signed)
CRITICAL VALUE ALERT  Critical value received:  K+ 2.5,  Glucose 591  Date of notification:  03/30/16  Time of notification:  0529  Critical value read back:Yes.    Nurse who received alert:  bkn  MD notified (1st page):  Kathrynn Humble  Time of first page:  0530  MD notified (2nd page):  Time of second page:  Responding MD:    Time MD responded:

## 2016-03-30 NOTE — H&P (Signed)
History and Physical    [name] QMV:[id number] DOB: 02-17-1926 DOA: 03/29/2016  PCP: Virgie Dad, MD  Patient coming from:  SNF  Chief Complaint:   High sugar, confused  HPI: [name] is a 83 y.o. female with medical history significant of  Advanced dementia, bed bound, DM, CHF, seizures sent in ffrom SNF for decreased po intake for several days, her insulin was stopped because she was not eating and today her glucose was over 600.  She has been more confused than her usual.  No other history obtained except from EDP.  Pt nonverbal.  Pt found to be constipated, septic with no source, with new findings of lung cancer with glucose over 1000.   Review of Systems:  Unobtainable due to ams  Past Medical History:  Diagnosis Date  . CHF (congestive heart failure) ([employer])   . Diabetes mellitus without complication ([place])   . Hypertension   . Seizures ([place])   . Syncope   . Thyroid disease     Past Surgical History:  Procedure Laterality Date  . FRACTURE SURGERY    . Hip surgery for fracture       has an unknown smoking status. She has never used smokeless tobacco. She reports that she does not drink alcohol or use drugs.  Allergies  Allergen Reactions  . Penicillins     No family history on file. unobtainable due to ams  Prior to Admission medications   Medication Sig Start Date End Date Taking? Authorizing Provider  amLODipine (NORVASC) 10 MG tablet Take 10 mg by mouth daily. For HTN    Historical Provider, MD  aspirin 81 MG tablet Take 81 mg by mouth daily.    Historical Provider, MD  calcium carbonate (TUMS - DOSED IN MG ELEMENTAL CALCIUM) 500 MG chewable tablet Chew 1 tablet by mouth 2 (two) times daily. For osteoporosis    Historical Provider, MD  cholecalciferol (VITAMIN D) 1000 UNITS tablet Take 1,000 Units by mouth daily. For osteoporosis    Historical Provider, MD  cloNIDine (CATAPRES) 0.1 MG tablet Take 0.1 mg by mouth 2 (two) times daily. For Hypertension     Historical Provider, MD  docusate sodium (COLACE) 100 MG capsule Take 100 mg by mouth daily.    Historical Provider, MD  feeding supplement, GLUCERNA SHAKE, (GLUCERNA SHAKE) LIQD Take 237 mLs by mouth 3 (three) times daily between meals.    Historical Provider, MD  levETIRAcetam (KEPPRA) 250 MG tablet Take 250 mg by mouth at bedtime. ( 2.5 ml ) oral    Historical Provider, MD  levothyroxine (LEVOTHROID) 25 MCG tablet Take 50 mcg by mouth daily before breakfast. For Hypothyroidism 09/24/15   Hendricks Limes, MD  metoprolol tartrate (LOPRESSOR) 25 MG tablet Take 3 tabs-equal 75 mg by mouth twice a day    Historical Provider, MD  mirtazapine (REMERON) 7.5 MG tablet Take 7.5 mg by mouth at bedtime.    Historical Provider, MD  Multiple Vitamin (MULTIVITAMIN) tablet Take 1 tablet by mouth daily.    Historical Provider, MD  ranitidine (ZANTAC) 150 MG tablet Take 75 mg by mouth 2 (two) times daily.    Historical Provider, MD    Physical Exam: Vitals:   03/29/16 2236 03/29/16 2238 03/29/16 2239  BP:   118/66  Pulse:   91  Resp:   22  Temp:   (!) 94.4 F (34.7 C)  TempSrc:   Rectal  SpO2: 96% 96% 96%    Constitutional: NAD, calm,  comfortable, chronic ill appearing, malnourished, contracted Vitals:   03/29/16 2236 03/29/16 2238 03/29/16 2239  BP:   118/66  Pulse:   91  Resp:   22  Temp:   (!) 94.4 F (34.7 C)  TempSrc:   Rectal  SpO2: 96% 96% 96%   Eyes: PERRL, lids and conjunctivae normal ENMT: Mucous membranes are dry. Posterior pharynx clear of any exudate or lesions.Normal dentition.  Neck: normal, supple, no masses, no thyromegaly Respiratory: clear to auscultation bilaterally, no wheezing, no crackles. Normal respiratory effort. No accessory muscle use.  Cardiovascular: Regular rate and rhythm, no murmurs / rubs / gallops. No extremity edema. 2+ pedal pulses. No carotid bruits.  Abdomen: no tenderness, no masses palpated. No hepatosplenomegaly. Bowel sounds positive.    Musculoskeletal: no clubbing / cyanosis. No joint deformity upper and lower extremities. Good ROM,  With contractures. Normal muscle tone but wasted Skin: no rashes, lesions, ulcers. No induration Neurologic: responds to voice, cannot follow commands Psychiatric: not agitated at this time, not oriented x 4.     Labs on Admission: I have personally reviewed following labs and imaging studies  CBC:  Recent Labs Lab 03/29/16 2357 03/30/16 0057  WBC 21.7*  --   NEUTROABS 20.1*  --   HGB 15.5* 17.7*  HCT 50.3* 52.0*  MCV 97.9  --   PLT 127*  --    Basic Metabolic Panel:  Recent Labs Lab 03/29/16 2357 03/30/16 0057  NA 147* 153*  K 3.5 3.6  CL 110 115*  CO2 22  --   GLUCOSE 1,064* >700*  BUN 74* 67*  CREATININE 1.73* 1.40*  CALCIUM 9.4  --    GFR: CrCl cannot be calculated (Unknown ideal weight.). Liver Function Tests:  Recent Labs Lab 03/29/16 2357  AST 23  ALT 22  ALKPHOS 87  BILITOT 0.8  PROT 8.1  ALBUMIN 3.7    Cardiac Enzymes:  Recent Labs Lab 03/29/16 2357  TROPONINI 0.04*    CBG:  Recent Labs Lab 03/29/16 2251 03/30/16 0105  GLUCAP >600* >600*    Urine analysis:    Component Value Date/Time   COLORURINE YELLOW 03/30/2016 0019   APPEARANCEUR HAZY (A) 03/30/2016 0019   LABSPEC 1.021 03/30/2016 0019   PHURINE 5.0 03/30/2016 0019   GLUCOSEU >=500 (A) 03/30/2016 0019   HGBUR NEGATIVE 03/30/2016 0019   BILIRUBINUR NEGATIVE 03/30/2016 0019   KETONESUR NEGATIVE 03/30/2016 0019   PROTEINUR NEGATIVE 03/30/2016 0019   UROBILINOGEN 0.2 07/28/2010 1456   NITRITE NEGATIVE 03/30/2016 0019   LEUKOCYTESUR NEGATIVE 03/30/2016 0019    Recent Results (from the past 240 hour(s))  Blood culture (routine x 2)     Status: None (Preliminary result)   Collection Time: 03/29/16 11:58 PM  Result Value Ref Range Status   Specimen Description BLOOD RIGHT ARM  Final   Special Requests BOTTLES DRAWN AEROBIC AND ANAEROBIC [facility] EACH  Final   Culture PENDING   Incomplete   Report Status PENDING  Incomplete     Radiological Exams on Admission: Ct Abdomen Pelvis Wo Contrast  Result Date: 03/30/2016 CLINICAL DATA:  Sepsis, hyperglycemia EXAM: CT CHEST, ABDOMEN AND PELVIS WITHOUT CONTRAST TECHNIQUE: Multidetector CT imaging of the chest, abdomen and pelvis was performed following the standard protocol without IV contrast. COMPARISON:  CT from 07/12/2011 of the abdomen and pelvis, CXR from 03/29/16 FINDINGS: CT CHEST FINDINGS Cardiovascular: Dense aortic atherosclerosis without aneurysm. Mild ectasia of the ascending and descending thoracic aorta. Coronary arteriosclerosis. Normal size cardiac chambers. No pericardial effusion. Mediastinum/Nodes:  No mediastinal lymphadenopathy. Visualized thyroid gland is unremarkable. Fluid-filled distention of the distal half of the esophagus likely from reflux secondary to marked fluid-filled gastric distention of stomach. Probable small to moderate hiatal hernia. Lungs/Pleura: Spiculated masslike opacities in the left upper lobe are identified, the largest abuts the major fissure measuring 14 mm on series 3, image 42. Two spiculated densities more cephalad in the left upper lobe are identified, one abutting the major fissure as well measuring 10 x 7 mm and the other measuring approximately 11 x 9 mm, series 3 image 18 and 21. There is atelectasis at the left lung base. No mass or consolidation noted of the right lung. There is mild centrilobular emphysema. Musculoskeletal: Multiple osteoporotic compression fractures, mild at T1, T3, T4, T5, moderate T6, marked T8, mild T11 and moderate T12. Chronic bilateral rib fractures. CT ABDOMEN PELVIS FINDINGS Hepatobiliary: Contracted gallbladder with gallstones. No space-occupying mass of the unenhanced liver. No biliary dilatation. Pancreas: Atrophic without mass. Spleen: No splenomegaly.  No mass. Adrenals/Urinary Tract: Right renal atrophy with cortical thinning. Compensatory hypertrophy  of the left kidney. No hydronephrosis nor hydroureter. Punctate lower pole nonobstructing left renal calculus. No renal mass. Physiologically distended bladder. No right adrenal mass. Fullness and thickening of the left adrenal gland. Stomach/Bowel: Marked fluid-filled gastric distention of the stomach. Mild fluid-filled distention of small intestine without obstruction. Moderate distal colonic fecal residue within the transverse through sigmoid colon with fecal impaction in the rectum. No acute inflammation. Vascular/Lymphatic: Densely calcified fusiform infrarenal abdominal aortic aneurysm terminating at the aortic bifurcation measuring up to 4.1 cm transverse which appears stable. Extension of calcified atherosclerosis into both common iliac arteries and branch vessels. Reproductive: Uterus not identified and may be surgically absent. No adnexal mass. Other: No abdominal wall hernia or abnormality. No abdominopelvic ascites. Musculoskeletal: Kyphoplasty L2, L4 and L5 with mild superior endplate compression of L3. Findings are similar to prior CT. IMPRESSION: 1. Spiculated left upper lobe pulmonary lesions concerning for neoplasm. The largest abuts the major fissure measuring approximately 14 mm in diameter with two additional spiculated lesions measuring 10 x 7 mm and 11 x 9 mm. 2. Multiple osteoporotic compression fractures of the thoracolumbar spine with vertebral augmentation in the lumbar spine as above. Chronic bilateral rib fractures. No findings of osseous metastatic disease. 3. Diffuse atherosclerosis of thoracic and abdominal aorta with fusiform stable dilatation of the infrarenal aorta up 4.1 cm transverse. 4. Uncomplicated cholelithiasis. 5. Marked fluid-filled gastric distention with reflux of fluid into a small hiatal hernia and mid to distal esophagus. Aspiration precautions recommended. NG tube decompression may prove useful. 6. Fecal impaction. 7. Atrophic right kidney with compensatory  hypertrophy of the left kidney. Punctate nonobstructing lower pole left renal calculus. Electronically Signed   By: Ashley Royalty M.D.   On: 03/30/2016 03:31   Ct Head Wo Contrast  Result Date: 03/30/2016 CLINICAL DATA:  Hyperglycemia, altered mental status, sepsis. Alzheimer's disease. EXAM: CT HEAD WITHOUT CONTRAST TECHNIQUE: Contiguous axial images were obtained from the base of the skull through the vertex without intravenous contrast. COMPARISON:  03/23/2005 CT and MRI FINDINGS: BRAIN: There is mild sulcal and moderate ventricular prominence consistent with superficial and central atrophy. No intraparenchymal hemorrhage, mass effect nor midline shift. Mild-to-moderate degree of periventricular and subcortical white matter hypodensities consistent with chronic small vessel ischemic disease are identified. No acute large vascular territory infarcts. No abnormal extra-axial fluid collections. Basal cisterns are not effaced and midline. VASCULAR: Moderate calcific atherosclerosis of the carotid siphons. SKULL: No  skull fracture. No significant scalp soft tissue swelling. SINUSES/ORBITS: The mastoid air-cells are clear. The included paranasal sinuses are well-aerated.The included ocular globes and orbital contents are non-suspicious. OTHER: None. IMPRESSION: Cerebral atrophy with chronic small vessel ischemic disease of periventricular white matter. No acute intracranial abnormality. Electronically Signed   By: Ashley Royalty M.D.   On: 03/30/2016 03:36   Ct Chest Wo Contrast  Result Date: 03/30/2016 CLINICAL DATA:  Sepsis, hyperglycemia EXAM: CT CHEST, ABDOMEN AND PELVIS WITHOUT CONTRAST TECHNIQUE: Multidetector CT imaging of the chest, abdomen and pelvis was performed following the standard protocol without IV contrast. COMPARISON:  CT from 07/12/2011 of the abdomen and pelvis, CXR from 03/29/16 FINDINGS: CT CHEST FINDINGS Cardiovascular: Dense aortic atherosclerosis without aneurysm. Mild ectasia of the  ascending and descending thoracic aorta. Coronary arteriosclerosis. Normal size cardiac chambers. No pericardial effusion. Mediastinum/Nodes: No mediastinal lymphadenopathy. Visualized thyroid gland is unremarkable. Fluid-filled distention of the distal half of the esophagus likely from reflux secondary to marked fluid-filled gastric distention of stomach. Probable small to moderate hiatal hernia. Lungs/Pleura: Spiculated masslike opacities in the left upper lobe are identified, the largest abuts the major fissure measuring 14 mm on series 3, image 42. Two spiculated densities more cephalad in the left upper lobe are identified, one abutting the major fissure as well measuring 10 x 7 mm and the other measuring approximately 11 x 9 mm, series 3 image 18 and 21. There is atelectasis at the left lung base. No mass or consolidation noted of the right lung. There is mild centrilobular emphysema. Musculoskeletal: Multiple osteoporotic compression fractures, mild at T1, T3, T4, T5, moderate T6, marked T8, mild T11 and moderate T12. Chronic bilateral rib fractures. CT ABDOMEN PELVIS FINDINGS Hepatobiliary: Contracted gallbladder with gallstones. No space-occupying mass of the unenhanced liver. No biliary dilatation. Pancreas: Atrophic without mass. Spleen: No splenomegaly.  No mass. Adrenals/Urinary Tract: Right renal atrophy with cortical thinning. Compensatory hypertrophy of the left kidney. No hydronephrosis nor hydroureter. Punctate lower pole nonobstructing left renal calculus. No renal mass. Physiologically distended bladder. No right adrenal mass. Fullness and thickening of the left adrenal gland. Stomach/Bowel: Marked fluid-filled gastric distention of the stomach. Mild fluid-filled distention of small intestine without obstruction. Moderate distal colonic fecal residue within the transverse through sigmoid colon with fecal impaction in the rectum. No acute inflammation. Vascular/Lymphatic: Densely calcified  fusiform infrarenal abdominal aortic aneurysm terminating at the aortic bifurcation measuring up to 4.1 cm transverse which appears stable. Extension of calcified atherosclerosis into both common iliac arteries and branch vessels. Reproductive: Uterus not identified and may be surgically absent. No adnexal mass. Other: No abdominal wall hernia or abnormality. No abdominopelvic ascites. Musculoskeletal: Kyphoplasty L2, L4 and L5 with mild superior endplate compression of L3. Findings are similar to prior CT. IMPRESSION: 1. Spiculated left upper lobe pulmonary lesions concerning for neoplasm. The largest abuts the major fissure measuring approximately 14 mm in diameter with two additional spiculated lesions measuring 10 x 7 mm and 11 x 9 mm. 2. Multiple osteoporotic compression fractures of the thoracolumbar spine with vertebral augmentation in the lumbar spine as above. Chronic bilateral rib fractures. No findings of osseous metastatic disease. 3. Diffuse atherosclerosis of thoracic and abdominal aorta with fusiform stable dilatation of the infrarenal aorta up 4.1 cm transverse. 4. Uncomplicated cholelithiasis. 5. Marked fluid-filled gastric distention with reflux of fluid into a small hiatal hernia and mid to distal esophagus. Aspiration precautions recommended. NG tube decompression may prove useful. 6. Fecal impaction. 7. Atrophic right kidney with compensatory  hypertrophy of the left kidney. Punctate nonobstructing lower pole left renal calculus. Electronically Signed   By: Ashley Royalty M.D.   On: 03/30/2016 03:31   Dg Chest Port 1 View  Result Date: 03/30/2016 CLINICAL DATA:  Hyperglycemia, confusion and restlessness. EXAM: PORTABLE CHEST 1 VIEW COMPARISON:  08/08/2010 CXR FINDINGS: Median 13 x 14 mm masslike opacity in the left mid lung. Stable cardiomegaly with tortuous thoracic aorta. Aortic atherosclerosis is again noted. Mild central vascular congestion. Subclavian and axillary atherosclerosis  bilaterally. No pneumonic consolidation, effusion or pneumothorax. Left costophrenic angle is excluded on current exam. The patient is status post upper lumbar vertebroplasty. Chain sutures are seen along the medial right upper lobe. IMPRESSION: New masslike opacity in the left mid lung measuring 13 x 14 mm. CT may help for further correlation. Stable cardiomegaly with aortic atherosclerosis. Mild central vascular congestion. Chain sutures in the right upper lobe. Electronically Signed   By: Ashley Royalty M.D.   On: 03/30/2016 00:18    EKG: Independently reviewed. Sinus tachycardia cxr reviewed mass noted, no edema or infiltrate Old chart reviewed Case discussed with EDP and ED RN   Assessment/Plan 83 yo female with sepsis of unclear etiology, new lung mass, advanced dementia  Principal Problem:   Sepsis ([place])- with significant lactate over 7.  Hypothermic with temp of 93.  bp stable, tachycardic.  ua neg, cxr neg.  Skin neg.  ??postobs pna from new masses possibility.  Place on vanc and cefepime.  Pan culture done.  Serial lactic acid q 3 hours.  Aggressive ivf, im sure chronic dehydration is playing a role in her acidosis.  Bare hugger.    Active Problems:   Hypothermia- as above   Lactic acidosis- as above, ivf   AKI (acute kidney injury) ([place])- noted, monitor uop closely   Acute metabolic encephalopathy- due to sepsis   DNR (do not resuscitate)- noted   Dementia advanced- noted   Convulsions/seizures ([place])- place on seizure precautions   CHF (congestive heart failure) ([place])- stable and compensated at this time   CAD (coronary artery disease) of artery bypass graft- noted   Abdominal aortic aneurysm ([place])- stable   Hypothyroidism- check tsh   Lung cancer ([place])- this is NEW, need to speak to family   Constipation- disimpact per RN, enema, hold off on NGT placement at this time as pt as benign abd exam and is not vomiting.  Keep npo   Diabetic hyperosmolar non-ketotic state ([place])-  glucomander drip started   Dehydration- as above   Consider palliative care consult, once speaking to family  DVT prophylaxis:  scds Code Status:  DNR Family Communication: none Disposition Plan:  Per day team Consults called:  none Admission status:  admission  Pt has overall very poor prognosis.   Geneen Dieter A MD [facility]  If 7PM-7AM, please contact night-coverage [email or web address] Password [facility]  03/30/2016, 4:57 AM

## 2016-03-30 NOTE — ED Notes (Signed)
Dr. Caryn Section aware of pt's heart rate.  No new orders at this time.

## 2016-03-30 NOTE — Consult Note (Signed)
Consultation Note Date: 03/30/2016   Patient Name: [name]  DOB: 03-Jun-1925  MRN: [id number]  Age / Sex: 83 y.o., female  PCP: Virgie Dad, MD Referring Physician: Rexene Alberts, MD  Reason for Consultation: Establishing goals of care and Psychosocial/spiritual support  HPI/Patient Profile: 83 y.o. female  with past medical history of CHF, diabetes, hypertension, seizure, thyroid disease, advanced dementia, bed/will chair bound, custodial SNF admitted on 03/29/2016 with sepsis with lactate over 7, hypothermic with temperatures 93, likely new diagnosis of lung cancer, increased blood sugars over 1000 with insulin drip.   Clinical Assessment and Goals of Care:  Contact with social worker at [facility]. She states that Mrs. Treiber has advanced directives that specify to allow a natural death. They also state that her responsible party is son Lucas Exline, and give contact information.  Mrs. Linam is lying quietly in bed. She is unable to communicate with me in any meaningful manner. Her blood sugars have been up to 1000, and she is on an insulin drip. There is no family at bedside. Calls to son Yittel Emrich at both cell and home phone numbers. Generic messages left on both. No return calls. Several visits to ICU throughout the day, no family present, no family contact.  Continue to treat the treatable at this point.  Healthcare power of attorney NEXT OF KIN - no family present, unable to reach. Per [facility], custodial SNF, son Dekota Kirlin is responsible party.   SUMMARY OF RECOMMENDATIONS   at this point, continue to treat the treatable but no extraordinary measures such as CPR or intubation.  Code Status/Advance Care Planning:  DNR  Symptom Management:   per hospitalist  Palliative Prophylaxis:   Turn Reposition  Additional Recommendations (Limitations, Scope, Preferences):  Continue to treat  the treatable but no extraordinary measures such as CPR or intubation  Psycho-social/Spiritual:   Desire for further Chaplaincy support:no  Additional Recommendations: Caregiving  Support/Resources and ICU Family Guide  Prognosis:   Unable to determine, based on outcomes. 3 to 6 months would not be surprising if Mrs. Sheran Spine is able to recover.  Discharge Planning: To be determined, likely return to [facility] as custodial SNF resident.      Primary Diagnoses: Present on Admission: . Lung cancer ([place]) . Dementia advanced . CAD (coronary artery disease) of artery bypass graft . Abdominal aortic aneurysm ([place]) . Hypothyroidism . Constipation . Diabetic hyperosmolar non-ketotic state ([place]) . Dehydration . Hypothermia . Lactic acidosis . AKI (acute kidney injury) ([place]) . Sepsis ([place]) . Acute metabolic encephalopathy . DNR (do not resuscitate)   I have reviewed the medical record, interviewed the patient and family, and examined the patient. The following aspects are pertinent.  Past Medical History:  Diagnosis Date  . CHF (congestive heart failure) ([place])   . Diabetes mellitus without complication ([place])   . Hypertension   . Seizures ([place])   . Syncope   . Thyroid disease    Social History   Social History  . Marital status: Married  Spouse name: N/A  . Number of children: N/A  . Years of education: N/A   Social History Main Topics  . Smoking status: Unknown If Ever Smoked  . Smokeless tobacco: Never Used  . Alcohol use No  . Drug use: No  . Sexual activity: No   Other Topics Concern  . None   Social History Narrative  . None   History reviewed. No pertinent family history. Scheduled Meds: . [START ON 03/31/2016] ceFEPime (MAXIPIME) IV  1 g Intravenous Q24H  . enoxaparin (LOVENOX) injection  30 mg Subcutaneous Q24H  . insulin aspart  0-15 Units Subcutaneous Q4H  . sodium phosphate  1 enema Rectal Once  . [START ON 03/31/2016] vancomycin  500 mg Intravenous  Q24H   Continuous Infusions: . sodium chloride    . dextrose 5 % and 0.45 % NaCl with KCl 40 mEq/L 125 mL/hr at 03/30/16 1057  . dextrose 5 % and 0.45% NaCl     PRN Meds:.dextrose Medications Prior to Admission:  Prior to Admission medications   Medication Sig Start Date End Date Taking? Authorizing Provider  amLODipine (NORVASC) 10 MG tablet Take 10 mg by mouth daily. For HTN    Historical Provider, MD  aspirin 81 MG tablet Take 81 mg by mouth daily.    Historical Provider, MD  calcium carbonate (TUMS - DOSED IN MG ELEMENTAL CALCIUM) 500 MG chewable tablet Chew 1 tablet by mouth 2 (two) times daily. For osteoporosis    Historical Provider, MD  cholecalciferol (VITAMIN D) 1000 UNITS tablet Take 1,000 Units by mouth daily. For osteoporosis    Historical Provider, MD  cloNIDine (CATAPRES) 0.1 MG tablet Take 0.1 mg by mouth 2 (two) times daily. For Hypertension    Historical Provider, MD  docusate sodium (COLACE) 100 MG capsule Take 100 mg by mouth daily.    Historical Provider, MD  feeding supplement, GLUCERNA SHAKE, (GLUCERNA SHAKE) LIQD Take 237 mLs by mouth 3 (three) times daily between meals.    Historical Provider, MD  levETIRAcetam (KEPPRA) 250 MG tablet Take 250 mg by mouth at bedtime. ( 2.5 ml ) oral    Historical Provider, MD  levothyroxine (LEVOTHROID) 25 MCG tablet Take 50 mcg by mouth daily before breakfast. For Hypothyroidism 09/24/15   Hendricks Limes, MD  metoprolol tartrate (LOPRESSOR) 25 MG tablet Take 3 tabs-equal 75 mg by mouth twice a day    Historical Provider, MD  mirtazapine (REMERON) 7.5 MG tablet Take 7.5 mg by mouth at bedtime.    Historical Provider, MD  Multiple Vitamin (MULTIVITAMIN) tablet Take 1 tablet by mouth daily.    Historical Provider, MD  ranitidine (ZANTAC) 150 MG tablet Take 75 mg by mouth 2 (two) times daily.    Historical Provider, MD   Allergies  Allergen Reactions  . Penicillins    Review of Systems  Unable to perform ROS: Acuity of condition     Physical Exam  Constitutional: No distress.  Lethargic, insulin drip.  Appears frail, thin, chronically ill  HENT:  Head: Normocephalic and atraumatic.  Cardiovascular:  Rate 90s to 110s  Pulmonary/Chest: Effort normal. No respiratory distress.  Abdominal: Soft. She exhibits no distension.  Musculoskeletal: She exhibits no edema.  Neurological:  Lethargic, insulin drip  Skin: Skin is warm and dry.  Nursing note and vitals reviewed.   Vital Signs: BP 135/86   Pulse (!) 107   Temp (!) 93.1 F (33.9 C) (Rectal)   Resp (!) 30   SpO2 98%  Pain Assessment:  FLACC       SpO2: SpO2: 98 % O2 Device:SpO2: 98 % O2 Flow Rate: .O2 Flow Rate (L/min): 2 L/min  IO: Intake/output summary:  Intake/Output Summary (Last 24 hours) at 03/30/16 1439 Last data filed at 03/30/16 0715  Gross per 24 hour  Intake             1350 ml  Output                0 ml  Net             1350 ml    LBM:   Baseline Weight:   Most recent weight:       Palliative Assessment/Data:   Flowsheet Rows   Flowsheet Row Most Recent Value  Intake Tab  Referral Department  Hospitalist  Unit at Time of Referral  ER  Palliative Care Primary Diagnosis  Cancer  Date Notified  03/30/16  Palliative Care Type  New Palliative care  Reason for referral  Clarify Goals of Care  Date of Admission  03/29/16  Date first seen by Palliative Care  03/30/16  # of days Palliative referral response time  0 Day(s)  # of days IP prior to Palliative referral  1  Clinical Assessment  Palliative Performance Scale Score  10%  Pain Max last 24 hours  Not able to report  Pain Min Last 24 hours  Not able to report  Dyspnea Max Last 24 Hours  Not able to report  Dyspnea Min Last 24 hours  Not able to report  Psychosocial & Spiritual Assessment  Palliative Care Outcomes  Patient/Family meeting held?  No [Unable to reach family, patient unable to participate]  Patient/Family wishes: Interventions discontinued/not started    Mechanical Ventilation  Palliative Care follow-up planned  -- [Follow-up while at [facility]]      Time In: 1115 Time Out: 1155 Time Total: 40 minutes Greater than 50%  of this time was spent counseling and coordinating care related to the above assessment and plan.  Signed by: Drue Novel, NP   Please contact Palliative Medicine Team phone at [phone] for questions and concerns.  For individual provider: See Shea Evans

## 2016-03-30 NOTE — ED Notes (Signed)
CRITICAL VALUE ALERT  Critical value received:  Glucose 1064, Troponin 0.04  Date of notification:  03/30/2016  Time of notification:  0113  Critical value read back:Yes.    Nurse who received alert:  Charlies Silvers RN  MD notified (1st page):  Kathrynn Humble  Time of first page:  0113  MD notified (2nd page):  Time of second page:  Responding MD:  Kathrynn Humble  Time MD responded:  [phone]

## 2016-03-31 ENCOUNTER — Inpatient Hospital Stay (HOSPITAL_COMMUNITY): Payer: Medicare Other

## 2016-03-31 DIAGNOSIS — R778 Other specified abnormalities of plasma proteins: Secondary | ICD-10-CM | POA: Diagnosis present

## 2016-03-31 DIAGNOSIS — R748 Abnormal levels of other serum enzymes: Secondary | ICD-10-CM

## 2016-03-31 DIAGNOSIS — C3412 Malignant neoplasm of upper lobe, left bronchus or lung: Secondary | ICD-10-CM

## 2016-03-31 DIAGNOSIS — E1100 Type 2 diabetes mellitus with hyperosmolarity without nonketotic hyperglycemic-hyperosmolar coma (NKHHC): Secondary | ICD-10-CM

## 2016-03-31 DIAGNOSIS — Z515 Encounter for palliative care: Secondary | ICD-10-CM

## 2016-03-31 DIAGNOSIS — I714 Abdominal aortic aneurysm, without rupture: Secondary | ICD-10-CM

## 2016-03-31 DIAGNOSIS — G9341 Metabolic encephalopathy: Secondary | ICD-10-CM

## 2016-03-31 DIAGNOSIS — E870 Hyperosmolality and hypernatremia: Secondary | ICD-10-CM | POA: Diagnosis present

## 2016-03-31 DIAGNOSIS — R7881 Bacteremia: Secondary | ICD-10-CM | POA: Diagnosis present

## 2016-03-31 DIAGNOSIS — R7989 Other specified abnormal findings of blood chemistry: Secondary | ICD-10-CM

## 2016-03-31 LAB — GLUCOSE, CAPILLARY
GLUCOSE-CAPILLARY: 145 mg/dL — AB (ref 65–99)
GLUCOSE-CAPILLARY: 161 mg/dL — AB (ref 65–99)
Glucose-Capillary: 151 mg/dL — ABNORMAL HIGH (ref 65–99)
Glucose-Capillary: 145 mg/dL — ABNORMAL HIGH (ref 65–99)

## 2016-03-31 LAB — BASIC METABOLIC PANEL
ANION GAP: 14 (ref 5–15)
BUN: 55 mg/dL — ABNORMAL HIGH (ref 6–20)
BUN: 59 mg/dL — AB (ref 6–20)
CO2: 15 mmol/L — AB (ref 22–32)
CO2: 15 mmol/L — ABNORMAL LOW (ref 22–32)
Calcium: 8.2 mg/dL — ABNORMAL LOW (ref 8.9–10.3)
Calcium: 7.8 mg/dL — ABNORMAL LOW (ref 8.9–10.3)
Chloride: 130 mmol/L — ABNORMAL HIGH (ref 101–111)
Chloride: >130 mmol/L (ref 101–111)
Creatinine, Ser: 1.31 mg/dL — ABNORMAL HIGH (ref 0.44–1.00)
Creatinine, Ser: 1.46 mg/dL — ABNORMAL HIGH (ref 0.44–1.00)
GFR calc Af Amer: 41 mL/min — ABNORMAL LOW (ref >60)
GFR calc Af Amer: 36 mL/min — ABNORMAL LOW (ref >60)
GFR calc non Af Amer: 35 mL/min — ABNORMAL LOW (ref >60)
GFR, EST NON AFRICAN AMERICAN: 31 mL/min — AB (ref >60)
GLUCOSE: 124 mg/dL — AB (ref 65–99)
GLUCOSE: 179 mg/dL — AB (ref 65–99)
POTASSIUM: 4.5 mmol/L (ref 3.5–5.1)
POTASSIUM: 5.2 mmol/L — AB (ref 3.5–5.1)
Sodium: 159 mmol/L — ABNORMAL HIGH (ref 135–145)
Sodium: 158 mmol/L — ABNORMAL HIGH (ref 135–145)

## 2016-03-31 LAB — CBC
HCT: 44.1 % (ref 36.0–46.0)
Hemoglobin: 14.1 g/dL (ref 12.0–15.0)
MCH: 30.3 pg (ref 26.0–34.0)
MCHC: 32 g/dL (ref 30.0–36.0)
MCV: 94.6 fL (ref 78.0–100.0)
PLATELETS: 75 10*3/uL — AB (ref 150–400)
RBC: 4.66 MIL/uL (ref 3.87–5.11)
RDW: 15.1 % (ref 11.5–15.5)
WBC: 16.6 10*3/uL — ABNORMAL HIGH (ref 4.0–10.5)

## 2016-03-31 LAB — MAGNESIUM: Magnesium: 2.1 mg/dL (ref 1.7–2.4)

## 2016-03-31 LAB — URINE CULTURE: Culture: NO GROWTH

## 2016-03-31 LAB — TROPONIN I: TROPONIN I: 0.33 ng/mL — AB (ref <0.03)

## 2016-03-31 MED ORDER — SODIUM BICARBONATE 8.4 % IV SOLN
INTRAVENOUS | Status: DC
  Filled 2016-03-31 (×4): qty 1000

## 2016-03-31 MED ORDER — SCOPOLAMINE 1 MG/3DAYS TD PT72: 1.0000 | MEDICATED_PATCH | TRANSDERMAL | Status: AC

## 2016-03-31 MED ORDER — FUROSEMIDE 10 MG/ML IJ SOLN
20.0000 mg | Freq: Once | INTRAMUSCULAR | Status: AC
  Administered 2016-03-31: 20 mg via INTRAVENOUS

## 2016-03-31 MED ORDER — MORPHINE SULFATE (PF) 2 MG/ML IV SOLN
1.0000 mg | INTRAVENOUS | Status: DC | PRN
  Administered 2016-03-31 (×3): 1 mg via INTRAVENOUS
  Filled 2016-03-31 (×3): qty 1

## 2016-03-31 MED ORDER — MORPHINE SULFATE (PF) 2 MG/ML IV SOLN: 1.0000 mg | INTRAVENOUS | 0 refills | Status: DC | PRN

## 2016-03-31 MED ORDER — SCOPOLAMINE 1 MG/3DAYS TD PT72
1.0000 | MEDICATED_PATCH | TRANSDERMAL | Status: DC
  Administered 2016-03-31: 1.5 mg via TRANSDERMAL
  Filled 2016-03-31: qty 1

## 2016-03-31 MED ORDER — VANCOMYCIN HCL 500 MG IV SOLR
INTRAVENOUS | Status: AC
  Filled 2016-03-31: qty 500

## 2016-03-31 MED ORDER — MORPHINE SULFATE 20 MG/5ML PO SOLN: 2.5000 mg | ORAL | 0 refills | Status: DC | PRN

## 2016-03-31 MED ORDER — LEVALBUTEROL HCL 0.63 MG/3ML IN NEBU: 0.6300 mg | INHALATION_SOLUTION | Freq: Three times a day (TID) | RESPIRATORY_TRACT | Status: AC

## 2016-03-31 NOTE — Progress Notes (Signed)
The patient is being transitioned to comfort care per my conversation with her son and POA Mr. Aiesha Leland.

## 2016-03-31 NOTE — NC FL2 (Signed)
[place] LEVEL OF CARE SCREENING TOOL     IDENTIFICATION  Patient Name: [name] Birthdate: 07-06-1925 Sex: female Admission Date (Current Location): 03/29/2016  [facility] and [place] Number:  [employer] and Address:  [facility] [place]. [facility], [place] [address] [place], [place] [address]      Provider Number: [id number]  Attending Physician Name and Address:  Rexene Alberts, MD  Relative Name and Phone Number:       Current Level of Care: Hospital Recommended Level of Care: [place] (comfort care) Prior Approval Number:    Date Approved/Denied:   PASRR Number: [id number]  Discharge Plan: SNF (comfort care)    Current Diagnoses: Patient Active Problem List   Diagnosis Date Noted  . Hypernatremia 03/31/2016  . Bacteremia due to Staphylococcus 03/31/2016  . Elevated troponin I level 03/31/2016  . Malignant neoplasm of lung ([place]) 03/30/2016  . Constipation 03/30/2016  . Diabetic hyperosmolar non-ketotic state ([place]) 03/30/2016  . Dehydration 03/30/2016  . Hypothermia 03/30/2016  . Lactic acidosis 03/30/2016  . AKI (acute kidney injury) ([place]) 03/30/2016  . Sepsis ([place]) 03/30/2016  . Acute metabolic encephalopathy 30/11/2328  . DNR (do not resuscitate) 03/30/2016  . Hypokalemia 03/30/2016  . Palliative care encounter   . Hypothyroidism 11/21/2015  . Diabetes mellitus type 2 with complications ([place]) 07/62/2633  . Thrombocytopenia ([place]) 07/14/2015  . Dementia advanced 07/06/2012  . HTN (hypertension) 07/06/2012  . Osteoporosis 07/06/2012  . Convulsions/seizures ([place]) 07/06/2012  . CHF (congestive heart failure) ([place]) 07/06/2012  . CAD (coronary artery disease) of artery bypass graft 07/06/2012  . Anemia 07/06/2012  . Abdominal aortic aneurysm ([place]) 07/06/2012  . Depression 07/06/2012    Orientation RESPIRATION BLADDER Height & Weight      (Nonverbal)  O2 (Nasal Canula 4 L) Incontinent, Indwelling catheter  Weight: 88 lb 2.9 oz (40 kg) Height:     BEHAVIORAL SYMPTOMS/MOOD NEUROLOGICAL BOWEL NUTRITION STATUS   (None) Convulsions/Seizures Continent Diet (See discharge summary: Currently NPO.)  AMBULATORY STATUS COMMUNICATION OF NEEDS Skin     Non-Verbally Normal                       Personal Care Assistance Level of Assistance              Functional Limitations Info  Speech, Sight, Hearing Sight Info: Adequate Hearing Info: Adequate Speech Info:  (Nonverbal)    SPECIAL CARE FACTORS FREQUENCY  Blood pressure, Diabetic urine testing                    Contractures Contractures Info: Not present    Additional Factors Info  Code Status, Allergies, Psychotropic Code Status Info: DNR Allergies Info: Penicillins Psychotropic Info: Depression         Current Medications (03/31/2016):  This is the current hospital active medication list Current Facility-Administered Medications  Medication Dose Route Frequency Provider Last Rate Last Dose  . ceFEPIme (MAXIPIME) 1 g in dextrose 5 % 50 mL IVPB  1 g Intravenous Q24H Rexene Alberts, MD   1 g at 03/31/16 3545  . dextrose 50 % solution 25 mL  25 mL Intravenous PRN Phillips Grout, MD      . furosemide (LASIX) injection 20 mg  20 mg Intravenous Q12H PRN Rexene Alberts, MD   20 mg at 03/30/16 1800  . levalbuterol (XOPENEX) nebulizer solution 0.63 mg  0.63 mg Nebulization Q8H Rexene Alberts, MD   0.63 mg at  03/31/16 9012  . levETIRAcetam (KEPPRA) 250 mg in sodium chloride 0.9 % 100 mL IVPB  250 mg Intravenous Q24H Rexene Alberts, MD   250 mg at 03/31/16 0000  . morphine 2 MG/ML injection 1 mg  1 mg Intravenous Q1H PRN Rexene Alberts, MD   1 mg at 03/31/16 1033  . scopolamine (TRANSDERM-SCOP) 1 MG/3DAYS 1.5 mg  1 patch Transdermal Q72H Rexene Alberts, MD      . vancomycin (VANCOCIN) 500 mg in sodium chloride 0.9 % 100 mL IVPB  500 mg Intravenous Q24H Rexene Alberts, MD   500 mg at 03/31/16 2241     Discharge Medications: Please see  discharge summary for a list of discharge medications.  Relevant Imaging Results:  Relevant Lab Results:   Additional Information SS#: [id number]. MD wants her to complete IV abx and will discharge with IV Morphine prn.  Candie Chroman, LCSW

## 2016-03-31 NOTE — Clinical Social Work Note (Addendum)
RNCM called CSW, stating that patient's RN said patient can return to [facility] today with comfort care. CSW called and confirmed with RN. RN asked if patient can continue her IV antibiotics until completed. CSW called and left voicemail at [facility] to discuss potential discharge plan for today. Awaiting call back.  Dayton Scrape, [place] [phone]  9:50 am CSW called [facility] again. No answer. Did not leave another voicemail.  Dayton Scrape, [place] [phone]  10:53 am CSW left voicemail for [employer] of nursing at [facility]. CSW was able to speak with front desk staff and left a message for patient's nurse to call CSW back. Message included plan for IV antibiotics and comfort care.   Dayton Scrape, [place] [phone]  12:04 pm CSW spoke with RN at [facility]. She is awaiting a response from their director of nursing regarding patient returning to them today with comfort care.  Dayton Scrape, [place] [phone]  2:09 pm Received call from RN at [facility]. They can take patient back today but were confused that there were no IV antibiotics on the discharge summary and wanted to know if Morphine IV can be switched to Roxinal sublingual 0.25 mL prn. SNF requested a script for this. CSW spoke with MD. She explained that she just wanted patient to finish out her antibiotics for today and was agreeable to changing the Morphine IV. RN notified. CSW updated patient's son. Updated discharge summary faxed to SNF.  Dayton Scrape, [place]

## 2016-03-31 NOTE — Care Management Note (Signed)
Case Management Note  Patient Details  Name: [name] MRN: [id number] Date of Birth: [birth year]  Subjective/Objective:                  hypothermia Action/Plan: Discharge planning Expected Discharge Date:  04/02/16               Expected Discharge Plan:  Skilled Nursing Facility  In-House Referral:  Clinical Social Work  Discharge planning Services  CM Consult  Post Acute Care Choice:    Choice offered to:     DME Arranged:    DME Agency:     HH Arranged:    [place] Agency:     Status of Service:  Completed, signed off  If discussed at [employer], dates discussed:    Additional Comments: CM received call from RN stating pt ready to go back to [facility] with comfort care. CM placed consult and called CSW Judson Roch, [phone].  No other CM needs were communicated. Dellie Catholic, RN 03/31/2016, 9:21 AM

## 2016-03-31 NOTE — Discharge Summary (Addendum)
Physician Discharge Summary  [name] IOE:[id number] DOB: 1925-08-11 DOA: 03/29/2016  PCP: Virgie Dad, MD  Admit date: 03/29/2016 Discharge date: 03/31/2016  Time spent: Greater than 30 minutes  Recommendations for Outpatient Follow-up:  1. Recommend consulting  hospice at the skilled nursing facility. 2. Per request of the patient's son, the patient should not be readmitted to the hospital and prefers that she is treated for comfort care at the [facility].     Discharge Diagnoses:  1. Transitioned to comfort care. DNR status. 2. Severe sepsis secondary to Staphylococcus bacteremia. -Severe lactic acidosis -Severe hypothermia -Hypotension secondary to sepsis; not septic shock 3. Left lung masses, secondary to lung cancer until proven otherwise. 4. Elevated troponin I secondary to demand ischemia in the patient with CAD. 5. Diabetic hyperosmolar non-ketotic state; blood glucose was greater than 1000. 6. Electrolyte abnormalities including hypernatremia, hypokalemia, hyperkalemia. 7. Acute kidney injury secondary to prerenal azotemia and ATN. 8. Bilateral infiltrates, secondary to pneumonia that fluffed out and/or decompensated diastolic CHF following volume resuscitation-clinically undetermined. 9. Infrarenal AAA, measuring 4.1 cm. 10. Advanced dementia. 11. Hypothyroidism. 12. History of seizure disorder. 13. Fecal impaction. 14. Multiple osteoporotic compression fractures of the thoracic-lumbar spine. 15. Acute encephalopathy, multifactorial.  Discharge Condition: Terminal  Diet recommendation: As tolerated  Filed Weights   03/31/16 0400  Weight: 40 kg (88 lb 2.9 oz)    History of present illness:  Patient is an 83 year old woman with a history of advanced dementia-bedbound, CAD diabetes mellitus, CHF with an EF of 55-65% per echo 08/2004, seizure disorder, HTN, and hypothyroidism. She was admitted for altered mental status and elevated blood sugars at the SNF. In  the ED, she was found to have hypothermia with a temperature of 94.4, SBP in the 80s to 100s, tachycardia, and transient hypoxia. Her blood glucose on admission was 1064. Her AG and CO2 were within normal limits. Her BUN was 74, creatinine 1.73, WBC 21.7, platelet count was 127 and her UA and CXR were not consistent with infection. Her lactic acid was elevated at 5.97 and procalcitonin was normal at 1.14. CT chest revealed spiculated left upper lobe pulmonary lesions concerning for neoplasm; multiple osteoporotic compression fractures of the thoracolumbar spine. CT of the abdomen revealed marketed fluid filled gastric distention with reflux of fluid and fecal impaction.  Hospital Course:  The patient was started on the Glucomander insulin protocol with changes in the IV fluids, scheduled monitoring of her basic metabolic panel, and scheduled monitoring of her CBGs every hour. Her serum potassium fell to 2.5. She was started on IV potassium runs and subsequently potassium was added to her maintenance IV fluids. Blood cultures were ordered on admission and she was started on broad-spectrum antibiotics with cefepime and vancomycin. The exact source of her sepsis was unclear on admission, given the unremarkable chest x-ray and UA. It was presumed that pneumonia could fluffed out following hydration, but this was clinically undetermined. A warming blanket was ordered due to her severe hypothermia. Despite multiple changes in her IV fluids to decrease the sodium load, her serum sodium did not improve. Her lactic acid level did decrease, but not to normal range. Her CO2/bicarbonate continued to decrease despite vigorous hydration. Her troponin I was noted to progressively increased, consistent with demand ischemia in this patient with a history of CAD. She developed bilateral rhonchi and the subsequent chest x-ray revealed bilateral infiltrates, but it was clinically undetermined if the infiltrates were secondary to  pneumonia or CHF or both. Nevertheless,  she was given as needed doses of IV Lasix to decrease the vascular edema. Her serum potassium improved and became slightly above normal range. Therefore, potassium was discontinued from IV fluids and gentle bicarbonate was added to both treat the hyperkalemia and metabolic acidosis. Her renal function/creatinine did improve compared to baseline. Her CBGs progressively improved; the insulin drip was eventually discontinued and sliding scale NovoLog was started. Based on the CO2 and anion gap on admission, she was not in diabetic ketoacidosis.  Her blood cultures eventually grew out Staphylococcus species, but the identification and sensitivities were still pending. Patient was noted to have multiple spiculated left upper lobe pulmonary lesions concerning for lung cancer. Given this finding, it was felt that the patient was likely terminal in the setting of multiple comorbid conditions and electrolyte abnormalities and sepsis. It was believed that her prognosis would be less than 1-2 weeks, therefore, palliative care was consulted. [name] attempted to call the patient's son and POA, but was unsuccessful.  Following treatment for more than 36 hours, the patient did not appear to be improving clinically or symptomatically. She remained virtually unresponsive and became more and more chest congested. I was able to discuss the patient's prognosis with her son and POA [name]. DNR had already been established. I informed him that I did not believe that she would survive the hospitalization and gently recommended comfort care/hospice. He was in total agreement as he mentioned that his father was eventually transitioned to comfort care not to long ago. He was in agreement with discontinuing vigorous treatment and desired that she be kept comfortable. He also requested that she be returned to the [facility] where hospice could be consulted there rather then having her transferred  to the hospice home.  Following our conversation, all medications not conducive to comfort were discontinued. Scopolamine patch was added in an attemmpt to dry secretions. She was started on when necessary morphine. The Foley catheter was kept in place. Oxygen was applied. She remained on duo-nebulizers and when necessary IV Lasix. Keppra was continued if she can tolerate it sublingually.  Hospice should be consulted when the patient returns to the SNF.    Procedures:  None  Consultations:  Palliative care  Discharge Exam: Vitals:   03/31/16 1000 03/31/16 1300  BP: 121/63 (!) 123/59  Pulse: (!) 124 (!) 129  Resp: (!) 38 (!) 40  Temp: 99.7 F (37.6 C) 97.5 F (36.4 C)    General: Frail cachectic appearing 83 year old African-American woman with labored breathing. Cardiovascular: S1, S2, with tachycardia. Respiratory: Bilateral rhonchi; breathing labored. Abdomen: Positive bowel sounds, soft, nontender, nondistended. Neuro: Patient is lethargic; she opens her eyes to voice and then falls back to sleep. She does not follow commands.  Discharge Instructions   Discharge Instructions    Increase activity slowly    Complete by:  As directed    Oxygen therapy    Complete by:  As directed      Current Discharge Medication List    START taking these medications   Details  levalbuterol (XOPENEX) 0.63 MG/3ML nebulizer solution Take 3 mLs (0.63 mg total) by nebulization every 8 (eight) hours.    morphine 20 MG/5ML solution Take 0.6 mLs (2.4 mg total) by mouth every 2 (two) hours as needed for pain (and increase in work of breathing). Qty: 5 mL, Refills: 0    scopolamine (TRANSDERM-SCOP) 1 MG/3DAYS Place 1 patch (1.5 mg total) onto the skin every 3 (three) days.  CONTINUE these medications which have NOT CHANGED   Details  levETIRAcetam (KEPPRA) 250 MG tablet Take 250 mg by mouth at bedtime. ( 2.5 ml ) oral      STOP taking these medications     benazepril  (LOTENSIN) 5 MG tablet      amLODipine (NORVASC) 10 MG tablet      aspirin 81 MG tablet      calcium carbonate (TUMS - DOSED IN MG ELEMENTAL CALCIUM) 500 MG chewable tablet      cholecalciferol (VITAMIN D) 1000 UNITS tablet      cloNIDine (CATAPRES) 0.1 MG tablet      docusate sodium (COLACE) 100 MG capsule      feeding supplement, GLUCERNA SHAKE, (GLUCERNA SHAKE) LIQD      levothyroxine (LEVOTHROID) 25 MCG tablet      metoprolol tartrate (LOPRESSOR) 25 MG tablet      mirtazapine (REMERON) 7.5 MG tablet      Multiple Vitamin (MULTIVITAMIN) tablet      ranitidine (ZANTAC) 150 MG tablet        Allergies  Allergen Reactions  . Penicillins       The results of significant diagnostics from this hospitalization (including imaging, microbiology, ancillary and laboratory) are listed below for reference.    Significant Diagnostic Studies: Ct Abdomen Pelvis Wo Contrast  Result Date: 03/30/2016 CLINICAL DATA:  Sepsis, hyperglycemia EXAM: CT CHEST, ABDOMEN AND PELVIS WITHOUT CONTRAST TECHNIQUE: Multidetector CT imaging of the chest, abdomen and pelvis was performed following the standard protocol without IV contrast. COMPARISON:  CT from 07/12/2011 of the abdomen and pelvis, CXR from 03/29/16 FINDINGS: CT CHEST FINDINGS Cardiovascular: Dense aortic atherosclerosis without aneurysm. Mild ectasia of the ascending and descending thoracic aorta. Coronary arteriosclerosis. Normal size cardiac chambers. No pericardial effusion. Mediastinum/Nodes: No mediastinal lymphadenopathy. Visualized thyroid gland is unremarkable. Fluid-filled distention of the distal half of the esophagus likely from reflux secondary to marked fluid-filled gastric distention of stomach. Probable small to moderate hiatal hernia. Lungs/Pleura: Spiculated masslike opacities in the left upper lobe are identified, the largest abuts the major fissure measuring 14 mm on series 3, image 42. Two spiculated densities more cephalad  in the left upper lobe are identified, one abutting the major fissure as well measuring 10 x 7 mm and the other measuring approximately 11 x 9 mm, series 3 image 18 and 21. There is atelectasis at the left lung base. No mass or consolidation noted of the right lung. There is mild centrilobular emphysema. Musculoskeletal: Multiple osteoporotic compression fractures, mild at T1, T3, T4, T5, moderate T6, marked T8, mild T11 and moderate T12. Chronic bilateral rib fractures. CT ABDOMEN PELVIS FINDINGS Hepatobiliary: Contracted gallbladder with gallstones. No space-occupying mass of the unenhanced liver. No biliary dilatation. Pancreas: Atrophic without mass. Spleen: No splenomegaly.  No mass. Adrenals/Urinary Tract: Right renal atrophy with cortical thinning. Compensatory hypertrophy of the left kidney. No hydronephrosis nor hydroureter. Punctate lower pole nonobstructing left renal calculus. No renal mass. Physiologically distended bladder. No right adrenal mass. Fullness and thickening of the left adrenal gland. Stomach/Bowel: Marked fluid-filled gastric distention of the stomach. Mild fluid-filled distention of small intestine without obstruction. Moderate distal colonic fecal residue within the transverse through sigmoid colon with fecal impaction in the rectum. No acute inflammation. Vascular/Lymphatic: Densely calcified fusiform infrarenal abdominal aortic aneurysm terminating at the aortic bifurcation measuring up to 4.1 cm transverse which appears stable. Extension of calcified atherosclerosis into both common iliac arteries and branch vessels. Reproductive: Uterus not identified and may be  surgically absent. No adnexal mass. Other: No abdominal wall hernia or abnormality. No abdominopelvic ascites. Musculoskeletal: Kyphoplasty L2, L4 and L5 with mild superior endplate compression of L3. Findings are similar to prior CT. IMPRESSION: 1. Spiculated left upper lobe pulmonary lesions concerning for neoplasm. The  largest abuts the major fissure measuring approximately 14 mm in diameter with two additional spiculated lesions measuring 10 x 7 mm and 11 x 9 mm. 2. Multiple osteoporotic compression fractures of the thoracolumbar spine with vertebral augmentation in the lumbar spine as above. Chronic bilateral rib fractures. No findings of osseous metastatic disease. 3. Diffuse atherosclerosis of thoracic and abdominal aorta with fusiform stable dilatation of the infrarenal aorta up 4.1 cm transverse. 4. Uncomplicated cholelithiasis. 5. Marked fluid-filled gastric distention with reflux of fluid into a small hiatal hernia and mid to distal esophagus. Aspiration precautions recommended. NG tube decompression may prove useful. 6. Fecal impaction. 7. Atrophic right kidney with compensatory hypertrophy of the left kidney. Punctate nonobstructing lower pole left renal calculus. Electronically Signed   By: Ashley Royalty M.D.   On: 03/30/2016 03:31   Ct Head Wo Contrast  Result Date: 03/30/2016 CLINICAL DATA:  Hyperglycemia, altered mental status, sepsis. Alzheimer's disease. EXAM: CT HEAD WITHOUT CONTRAST TECHNIQUE: Contiguous axial images were obtained from the base of the skull through the vertex without intravenous contrast. COMPARISON:  03/23/2005 CT and MRI FINDINGS: BRAIN: There is mild sulcal and moderate ventricular prominence consistent with superficial and central atrophy. No intraparenchymal hemorrhage, mass effect nor midline shift. Mild-to-moderate degree of periventricular and subcortical white matter hypodensities consistent with chronic small vessel ischemic disease are identified. No acute large vascular territory infarcts. No abnormal extra-axial fluid collections. Basal cisterns are not effaced and midline. VASCULAR: Moderate calcific atherosclerosis of the carotid siphons. SKULL: No skull fracture. No significant scalp soft tissue swelling. SINUSES/ORBITS: The mastoid air-cells are clear. The included paranasal  sinuses are well-aerated.The included ocular globes and orbital contents are non-suspicious. OTHER: None. IMPRESSION: Cerebral atrophy with chronic small vessel ischemic disease of periventricular white matter. No acute intracranial abnormality. Electronically Signed   By: Ashley Royalty M.D.   On: 03/30/2016 03:36   Ct Chest Wo Contrast  Result Date: 03/30/2016 CLINICAL DATA:  Sepsis, hyperglycemia EXAM: CT CHEST, ABDOMEN AND PELVIS WITHOUT CONTRAST TECHNIQUE: Multidetector CT imaging of the chest, abdomen and pelvis was performed following the standard protocol without IV contrast. COMPARISON:  CT from 07/12/2011 of the abdomen and pelvis, CXR from 03/29/16 FINDINGS: CT CHEST FINDINGS Cardiovascular: Dense aortic atherosclerosis without aneurysm. Mild ectasia of the ascending and descending thoracic aorta. Coronary arteriosclerosis. Normal size cardiac chambers. No pericardial effusion. Mediastinum/Nodes: No mediastinal lymphadenopathy. Visualized thyroid gland is unremarkable. Fluid-filled distention of the distal half of the esophagus likely from reflux secondary to marked fluid-filled gastric distention of stomach. Probable small to moderate hiatal hernia. Lungs/Pleura: Spiculated masslike opacities in the left upper lobe are identified, the largest abuts the major fissure measuring 14 mm on series 3, image 42. Two spiculated densities more cephalad in the left upper lobe are identified, one abutting the major fissure as well measuring 10 x 7 mm and the other measuring approximately 11 x 9 mm, series 3 image 18 and 21. There is atelectasis at the left lung base. No mass or consolidation noted of the right lung. There is mild centrilobular emphysema. Musculoskeletal: Multiple osteoporotic compression fractures, mild at T1, T3, T4, T5, moderate T6, marked T8, mild T11 and moderate T12. Chronic bilateral rib fractures. CT ABDOMEN PELVIS FINDINGS  Hepatobiliary: Contracted gallbladder with gallstones. No  space-occupying mass of the unenhanced liver. No biliary dilatation. Pancreas: Atrophic without mass. Spleen: No splenomegaly.  No mass. Adrenals/Urinary Tract: Right renal atrophy with cortical thinning. Compensatory hypertrophy of the left kidney. No hydronephrosis nor hydroureter. Punctate lower pole nonobstructing left renal calculus. No renal mass. Physiologically distended bladder. No right adrenal mass. Fullness and thickening of the left adrenal gland. Stomach/Bowel: Marked fluid-filled gastric distention of the stomach. Mild fluid-filled distention of small intestine without obstruction. Moderate distal colonic fecal residue within the transverse through sigmoid colon with fecal impaction in the rectum. No acute inflammation. Vascular/Lymphatic: Densely calcified fusiform infrarenal abdominal aortic aneurysm terminating at the aortic bifurcation measuring up to 4.1 cm transverse which appears stable. Extension of calcified atherosclerosis into both common iliac arteries and branch vessels. Reproductive: Uterus not identified and may be surgically absent. No adnexal mass. Other: No abdominal wall hernia or abnormality. No abdominopelvic ascites. Musculoskeletal: Kyphoplasty L2, L4 and L5 with mild superior endplate compression of L3. Findings are similar to prior CT. IMPRESSION: 1. Spiculated left upper lobe pulmonary lesions concerning for neoplasm. The largest abuts the major fissure measuring approximately 14 mm in diameter with two additional spiculated lesions measuring 10 x 7 mm and 11 x 9 mm. 2. Multiple osteoporotic compression fractures of the thoracolumbar spine with vertebral augmentation in the lumbar spine as above. Chronic bilateral rib fractures. No findings of osseous metastatic disease. 3. Diffuse atherosclerosis of thoracic and abdominal aorta with fusiform stable dilatation of the infrarenal aorta up 4.1 cm transverse. 4. Uncomplicated cholelithiasis. 5. Marked fluid-filled gastric  distention with reflux of fluid into a small hiatal hernia and mid to distal esophagus. Aspiration precautions recommended. NG tube decompression may prove useful. 6. Fecal impaction. 7. Atrophic right kidney with compensatory hypertrophy of the left kidney. Punctate nonobstructing lower pole left renal calculus. Electronically Signed   By: Ashley Royalty M.D.   On: 03/30/2016 03:31   Dg Chest Port 1 View  Result Date: 03/31/2016 CLINICAL DATA:  Shortness of breath. EXAM: PORTABLE CHEST 1 VIEW COMPARISON:  03/30/2016 and 03/29/2016 FINDINGS: Again noted is a suspicious nodule in the left lung. Increased interstitial densities since the recent chest radiograph is concerning for pulmonary edema. Cardiac silhouette remains upper limits of normal. Thoracic aorta is heavily calcified. Additional patchy nodular densities at the left lung apex are similar to the recent chest CT. Negative for a pneumothorax. IMPRESSION: Increased interstitial densities in both lungs. Findings could represent interstitial pulmonary edema. Again noted are nodular densities/lesions in the left lung. These are better characterized on the recent chest CT. Electronically Signed   By: Markus Daft M.D.   On: 03/31/2016 09:37   Dg Chest Port 1 View  Result Date: 03/30/2016 CLINICAL DATA:  Hyperglycemia, confusion and restlessness. EXAM: PORTABLE CHEST 1 VIEW COMPARISON:  08/08/2010 CXR FINDINGS: Median 13 x 14 mm masslike opacity in the left mid lung. Stable cardiomegaly with tortuous thoracic aorta. Aortic atherosclerosis is again noted. Mild central vascular congestion. Subclavian and axillary atherosclerosis bilaterally. No pneumonic consolidation, effusion or pneumothorax. Left costophrenic angle is excluded on current exam. The patient is status post upper lumbar vertebroplasty. Chain sutures are seen along the medial right upper lobe. IMPRESSION: New masslike opacity in the left mid lung measuring 13 x 14 mm. CT may help for further  correlation. Stable cardiomegaly with aortic atherosclerosis. Mild central vascular congestion. Chain sutures in the right upper lobe. Electronically Signed   By: Meredith Leeds.D.  On: 03/30/2016 00:18    Microbiology: Recent Results (from the past 240 hour(s))  Blood culture (routine x 2)     Status: None (Preliminary result)   Collection Time: 03/29/16 11:58 PM  Result Value Ref Range Status   Specimen Description BLOOD RIGHT ARM  Final   Special Requests BOTTLES DRAWN AEROBIC AND ANAEROBIC [facility] EACH  Final   Culture  Setup Time   Final    GRAM POSITIVE COCCI ANAEROBIC BOTTLE ONLY Gram Stain Report Called to,Read Back By and Verified With: MISTY MCDANIEL RN AT [id number] BY HFLYNT 03/30/16 CRITICAL RESULT CALLED TO, READ BACK BY AND VERIFIED WITH: Rema Fendt RN [id number] 03/30/16 A BROWNING    Culture   Final    GRAM POSITIVE COCCI CULTURE REINCUBATED FOR BETTER GROWTH Performed at [facility], [place] [address] [place], [place] [address]    Report Status PENDING  Incomplete  Blood Culture ID Panel (Reflexed)     Status: Abnormal   Collection Time: 03/29/16 11:58 PM  Result Value Ref Range Status   Enterococcus species NOT DETECTED NOT DETECTED Final   Listeria monocytogenes NOT DETECTED NOT DETECTED Final   Staphylococcus species DETECTED (A) NOT DETECTED Final    Comment: CRITICAL RESULT CALLED TO, READ BACK BY AND VERIFIED WITH: Rema Fendt RN [id number] 03/30/16 A BROWNING    Staphylococcus aureus NOT DETECTED NOT DETECTED Final   Methicillin resistance NOT DETECTED NOT DETECTED Final   Streptococcus species NOT DETECTED NOT DETECTED Final   Streptococcus agalactiae NOT DETECTED NOT DETECTED Final   Streptococcus pneumoniae NOT DETECTED NOT DETECTED Final   Streptococcus pyogenes NOT DETECTED NOT DETECTED Final   Acinetobacter baumannii NOT DETECTED NOT DETECTED Final   Enterobacteriaceae species NOT DETECTED NOT DETECTED Final   Enterobacter cloacae complex NOT DETECTED NOT DETECTED Final    Escherichia coli NOT DETECTED NOT DETECTED Final   Klebsiella oxytoca NOT DETECTED NOT DETECTED Final   Klebsiella pneumoniae NOT DETECTED NOT DETECTED Final   Proteus species NOT DETECTED NOT DETECTED Final   Serratia marcescens NOT DETECTED NOT DETECTED Final   Haemophilus influenzae NOT DETECTED NOT DETECTED Final   Neisseria meningitidis NOT DETECTED NOT DETECTED Final   Pseudomonas aeruginosa NOT DETECTED NOT DETECTED Final   Candida albicans NOT DETECTED NOT DETECTED Final   Candida glabrata NOT DETECTED NOT DETECTED Final   Candida krusei NOT DETECTED NOT DETECTED Final   Candida parapsilosis NOT DETECTED NOT DETECTED Final   Candida tropicalis NOT DETECTED NOT DETECTED Final    Comment: Performed at [facility], [address] [place], [place] [address]  Blood culture (routine x 2)     Status: None (Preliminary result)   Collection Time: 03/30/16 12:10 AM  Result Value Ref Range Status   Specimen Description BLOOD  Final   Special Requests NONE  Final   Culture NO GROWTH 1 DAY  Final   Report Status PENDING  Incomplete  Urine culture     Status: None   Collection Time: 03/30/16 12:19 AM  Result Value Ref Range Status   Specimen Description URINE, RANDOM  Final   Special Requests NONE  Final   Culture   Final    NO GROWTH Performed at [facility], [place] [address] [place], [place] [address]    Report Status 03/31/2016 FINAL  Final  MRSA PCR Screening     Status: None   Collection Time: 03/30/16 10:56 AM  Result Value Ref Range Status   MRSA by  PCR NEGATIVE NEGATIVE Final    Comment:        The GeneXpert MRSA Assay (FDA approved for NASAL specimens only), is one component of a comprehensive MRSA colonization surveillance program. It is not intended to diagnose MRSA infection nor to guide or monitor treatment for MRSA infections.      Labs: Basic Metabolic Panel:  Recent Labs Lab 03/30/16 0413 03/30/16 1051 03/30/16 1331 03/31/16 0129  03/31/16 0602  NA 156* 155* 157* 159* 158*  K 2.5* 2.5* 3.0* 4.5 5.2*  CL 126* 130* 131* 130* >130*  CO2 16* 15* 17* 15* 15*  GLUCOSE 591* 189* 106* 124* 179*  BUN 71* 59* 55* 55* 59*  CREATININE 1.51* 1.25* 1.18* 1.31* 1.46*  CALCIUM 8.9 8.7* 8.6* 8.2* 7.8*  MG  --   --  2.3  --  2.1   Liver Function Tests:  Recent Labs Lab 03/29/16 2357  AST 23  ALT 22  ALKPHOS 87  BILITOT 0.8  PROT 8.1  ALBUMIN 3.7   No results for input(s): LIPASE, AMYLASE in the last 168 hours. No results for input(s): AMMONIA in the last 168 hours. CBC:  Recent Labs Lab 03/29/16 2357 03/30/16 0029 03/30/16 0057 03/31/16 0602  WBC 21.7*  --   --  16.6*  NEUTROABS 20.1*  --   --   --   HGB 15.5* 16.7* 17.7* 14.1  HCT 50.3* 49.0* 52.0* 44.1  MCV 97.9  --   --  94.6  PLT 127*  --   --  75*   Cardiac Enzymes:  Recent Labs Lab 03/29/16 2357 03/30/16 0801 03/30/16 1634 03/31/16 0129  TROPONINI 0.04* 0.10* 0.15* 0.33*   BNP: BNP (last 3 results) No results for input(s): BNP in the last 8760 hours.  ProBNP (last 3 results) No results for input(s): PROBNP in the last 8760 hours.  CBG:  Recent Labs Lab 03/30/16 2211 03/30/16 2343 03/31/16 0417 03/31/16 0806 03/31/16 1239  GLUCAP 131* 161* 151* 145* 145*       Signed:  Tsugio Elison MD.  [facility] 03/31/2016, 2:02 PM

## 2016-03-31 NOTE — Clinical Social Work Note (Signed)
CSW facilitated patient discharge including contacting patient family and facility [facility]) to confirm patient discharge plans. Clinical information faxed to facility and family agreeable with plan.  RN to call report prior to discharge to [facility].  CSW will sign off for now as social work intervention is no longer needed. Please consult [place] again if new needs arise.  Dayton Scrape, [place]

## 2016-04-01 LAB — CULTURE, BLOOD (ROUTINE X 2)

## 2016-04-02 ENCOUNTER — Other Ambulatory Visit: Payer: Self-pay | Admitting: *Deleted

## 2016-04-02 MED ORDER — MORPHINE SULFATE 20 MG/5ML PO SOLN: 2.5000 mg | ORAL | 0 refills | Status: AC | PRN

## 2016-04-02 NOTE — Telephone Encounter (Signed)
[facility] #[phone] Fax: [phone]   

## 2016-04-04 LAB — CULTURE, BLOOD (ROUTINE X 2): CULTURE: NO GROWTH

## 2016-04-05 DEATH — deceased

## 2016-04-25 NOTE — Progress Notes (Signed)
This encounter was created in error - please disregard.

## 2016-07-19 NOTE — Progress Notes (Signed)
This encounter was created in error - please disregard.
